# Patient Record
Sex: Male | Born: 1960
Health system: Southern US, Community
[De-identification: ages and names within clinical notes are randomized; demographics above are authoritative.]

## PROBLEM LIST (undated history)

## (undated) DIAGNOSIS — C61 Malignant neoplasm of prostate: Secondary | ICD-10-CM

## (undated) DIAGNOSIS — S069X9A Unspecified intracranial injury with loss of consciousness of unspecified duration, initial encounter: Secondary | ICD-10-CM

## (undated) DIAGNOSIS — N401 Enlarged prostate with lower urinary tract symptoms: Secondary | ICD-10-CM

## (undated) DIAGNOSIS — Z87442 Personal history of urinary calculi: Secondary | ICD-10-CM

## (undated) DIAGNOSIS — M1990 Unspecified osteoarthritis, unspecified site: Secondary | ICD-10-CM

## (undated) DIAGNOSIS — Z87828 Personal history of other (healed) physical injury and trauma: Secondary | ICD-10-CM

## (undated) DIAGNOSIS — G40909 Epilepsy, unspecified, not intractable, without status epilepticus: Secondary | ICD-10-CM

## (undated) DIAGNOSIS — Z8719 Personal history of other diseases of the digestive system: Secondary | ICD-10-CM

## (undated) DIAGNOSIS — R569 Unspecified convulsions: Secondary | ICD-10-CM

## (undated) DIAGNOSIS — Z87820 Personal history of traumatic brain injury: Secondary | ICD-10-CM

## (undated) DIAGNOSIS — G3184 Mild cognitive impairment, so stated: Secondary | ICD-10-CM

## (undated) DIAGNOSIS — Z973 Presence of spectacles and contact lenses: Secondary | ICD-10-CM

## (undated) DIAGNOSIS — G8114 Spastic hemiplegia affecting left nondominant side: Secondary | ICD-10-CM

## (undated) HISTORY — DX: Personal history of other diseases of the digestive system: Z87.19

## (undated) HISTORY — PX: HEMICOLECTOMY W/ OSTOMY: SHX1738

## (undated) HISTORY — PX: ABDOMINAL SURGERY: SHX537

## (undated) HISTORY — PX: COLOSTOMY TAKEDOWN: SHX5783

---

## 1996-12-22 DIAGNOSIS — Z87820 Personal history of traumatic brain injury: Secondary | ICD-10-CM

## 1996-12-22 HISTORY — DX: Personal history of traumatic brain injury: Z87.820

## 2011-11-20 ENCOUNTER — Emergency Department (HOSPITAL_COMMUNITY): Payer: Medicare PPO

## 2011-11-20 ENCOUNTER — Emergency Department (HOSPITAL_COMMUNITY)
Admission: EM | Admit: 2011-11-20 | Discharge: 2011-11-20 | Disposition: A | Discharge status: Home / Self Care | Payer: Medicare PPO | Attending: Emergency Medicine | Admitting: Emergency Medicine

## 2011-11-20 ENCOUNTER — Encounter: Payer: Self-pay | Admitting: *Deleted

## 2011-11-20 DIAGNOSIS — M545 Low back pain, unspecified: Secondary | ICD-10-CM | POA: Insufficient documentation

## 2011-11-20 DIAGNOSIS — M549 Dorsalgia, unspecified: Secondary | ICD-10-CM

## 2011-11-20 HISTORY — DX: Epilepsy, unspecified, not intractable, without status epilepticus: G40.909

## 2011-11-20 LAB — URINALYSIS, ROUTINE W REFLEX MICROSCOPIC
Glucose, UA: NEGATIVE mg/dL
Ketones, ur: NEGATIVE mg/dL
Leukocytes, UA: NEGATIVE
Nitrite: NEGATIVE
Specific Gravity, Urine: 1.034 — ABNORMAL HIGH (ref 1.005–1.030)
pH: 6.5 (ref 5.0–8.0)

## 2011-11-20 MED ORDER — HYDROCODONE-ACETAMINOPHEN 5-325 MG PO TABS: 1.0000 | ORAL_TABLET | ORAL | Status: AC | PRN

## 2011-11-20 NOTE — ED Notes (Signed)
Pt states "it started night before last, couldn't lay down, couldn't get comfortable"; pt indicates pain is in mid-back, denies dysuria.

## 2011-11-20 NOTE — ED Notes (Signed)
Patient transported to X-ray 

## 2011-11-20 NOTE — ED Notes (Signed)
MD at bedside. 
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
## 2012-03-29 ENCOUNTER — Ambulatory Visit (HOSPITAL_COMMUNITY)
Admission: RE | Admit: 2012-03-29 | Discharge: 2012-03-29 | Disposition: A | Discharge status: Home / Self Care | Payer: Medicare PPO | Source: Ambulatory Visit | Attending: Family Medicine | Admitting: Family Medicine

## 2012-03-29 ENCOUNTER — Other Ambulatory Visit: Payer: Self-pay

## 2012-03-29 DIAGNOSIS — I498 Other specified cardiac arrhythmias: Secondary | ICD-10-CM | POA: Insufficient documentation

## 2012-03-29 LAB — LIPID PANEL
Cholesterol: 189 mg/dL (ref 0–200)
Total CHOL/HDL Ratio: 2.8 RATIO

## 2012-03-29 LAB — HEMOGLOBIN A1C
Hgb A1c MFr Bld: 5.7 % — ABNORMAL HIGH (ref <5.7)
Mean Plasma Glucose: 117 mg/dL — ABNORMAL HIGH (ref <117)

## 2012-03-29 LAB — GLUCOSE, RANDOM: Glucose, Bld: 97 mg/dL (ref 70–99)

## 2012-06-04 ENCOUNTER — Encounter (HOSPITAL_COMMUNITY): Payer: Self-pay | Admitting: *Deleted

## 2012-06-04 ENCOUNTER — Inpatient Hospital Stay (HOSPITAL_COMMUNITY)
Admission: EM | Admit: 2012-06-04 | Discharge: 2012-06-06 | DRG: 101 | Disposition: A | Discharge status: Home / Self Care | Payer: Medicare HMO | Attending: Internal Medicine | Admitting: Internal Medicine

## 2012-06-04 DIAGNOSIS — Z9119 Patient's noncompliance with other medical treatment and regimen: Secondary | ICD-10-CM

## 2012-06-04 DIAGNOSIS — Z79899 Other long term (current) drug therapy: Secondary | ICD-10-CM

## 2012-06-04 DIAGNOSIS — G40909 Epilepsy, unspecified, not intractable, without status epilepticus: Principal | ICD-10-CM | POA: Diagnosis present

## 2012-06-04 DIAGNOSIS — Z91199 Patient's noncompliance with other medical treatment and regimen due to unspecified reason: Secondary | ICD-10-CM

## 2012-06-04 DIAGNOSIS — R270 Ataxia, unspecified: Secondary | ICD-10-CM

## 2012-06-04 DIAGNOSIS — R569 Unspecified convulsions: Secondary | ICD-10-CM

## 2012-06-04 LAB — COMPREHENSIVE METABOLIC PANEL
ALT: 14 U/L (ref 0–53)
AST: 20 U/L (ref 0–37)
CO2: 23 mEq/L (ref 19–32)
Chloride: 104 mEq/L (ref 96–112)
Creatinine, Ser: 0.9 mg/dL (ref 0.50–1.35)
GFR calc Af Amer: >90 mL/min (ref >90)
GFR calc non Af Amer: >90 mL/min (ref >90)
Glucose, Bld: 123 mg/dL — ABNORMAL HIGH (ref 70–99)
Total Bilirubin: 0.3 mg/dL (ref 0.3–1.2)

## 2012-06-04 LAB — CBC
Hemoglobin: 14.9 g/dL (ref 13.0–17.0)
MCV: 89.6 fL (ref 78.0–100.0)
Platelets: 160 10*3/uL (ref 150–400)
RBC: 4.9 MIL/uL (ref 4.22–5.81)
WBC: 4.3 10*3/uL (ref 4.0–10.5)

## 2012-06-04 MED ORDER — LORAZEPAM 2 MG/ML IJ SOLN
INTRAMUSCULAR | Status: AC
  Administered 2012-06-04: 1 mg
  Filled 2012-06-04: qty 1

## 2012-06-04 MED ORDER — SODIUM CHLORIDE 0.9 % IV SOLN
500.0000 mg | Freq: Two times a day (BID) | INTRAVENOUS | Status: DC
  Administered 2012-06-04: 500 mg via INTRAVENOUS
  Filled 2012-06-04 (×2): qty 5

## 2012-06-04 NOTE — ED Notes (Signed)
Pt stated that he could not urinate.    
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
## 2012-06-04 NOTE — ED Notes (Signed)
Patient having a seizure at this time. Seizure involves the patient's whole body with jerking and stiffness of extremities noted. Eyes are closed. Skin warm. Patient foaming at the mouth and biting tongue. No incontinence noted. Orson Slick, PA and Hyman Hopes, MD at bedside.

## 2012-06-04 NOTE — ED Notes (Signed)
Per EMS pt in from home watching movie and had witnessed seizure. Pt post-ictal on EMS arrival. Now A&Ox's4. Pt denies other complaints.

## 2012-06-04 NOTE — ED Notes (Signed)
Labs drawn by RN 

## 2012-06-05 DIAGNOSIS — G40309 Generalized idiopathic epilepsy and epileptic syndromes, not intractable, without status epilepticus: Secondary | ICD-10-CM

## 2012-06-05 LAB — CARDIAC PANEL(CRET KIN+CKTOT+MB+TROPI)
Relative Index: 0.9 (ref 0.0–2.5)
Total CK: 456 U/L — ABNORMAL HIGH (ref 7–232)
Troponin I: <0.3 ng/mL (ref <0.30)

## 2012-06-05 LAB — TSH: TSH: 0.975 u[IU]/mL (ref 0.350–4.500)

## 2012-06-05 LAB — GLUCOSE, CAPILLARY: Glucose-Capillary: 91 mg/dL (ref 70–99)

## 2012-06-05 MED ORDER — HYDROCODONE-ACETAMINOPHEN 5-325 MG PO TABS: 1.0000 | ORAL_TABLET | ORAL | Status: DC | PRN

## 2012-06-05 MED ORDER — SODIUM CHLORIDE 0.9 % IV SOLN: INTRAVENOUS | Status: AC

## 2012-06-05 MED ORDER — LEVETIRACETAM 500 MG PO TABS
500.0000 mg | ORAL_TABLET | Freq: Two times a day (BID) | ORAL | Status: DC
  Administered 2012-06-05 – 2012-06-06 (×3): 500 mg via ORAL
  Filled 2012-06-05 (×4): qty 1

## 2012-06-05 MED ORDER — LORAZEPAM 2 MG/ML IJ SOLN: 1.0000 mg | Freq: Four times a day (QID) | INTRAMUSCULAR | Status: DC | PRN

## 2012-06-05 MED ORDER — SODIUM CHLORIDE 0.9 % IJ SOLN
3.0000 mL | Freq: Two times a day (BID) | INTRAMUSCULAR | Status: DC
  Administered 2012-06-05 (×2): 3 mL via INTRAVENOUS

## 2012-06-05 MED ORDER — ENOXAPARIN SODIUM 40 MG/0.4ML ~~LOC~~ SOLN
40.0000 mg | SUBCUTANEOUS | Status: DC
  Administered 2012-06-05: 40 mg via SUBCUTANEOUS
  Filled 2012-06-05 (×2): qty 0.4

## 2012-06-05 MED ORDER — ONDANSETRON HCL 4 MG/2ML IJ SOLN: 4.0000 mg | Freq: Three times a day (TID) | INTRAMUSCULAR | Status: AC | PRN

## 2012-06-05 MED ORDER — CARBAMAZEPINE ER 400 MG PO TB12
400.0000 mg | ORAL_TABLET | Freq: Two times a day (BID) | ORAL | Status: DC
  Administered 2012-06-05 – 2012-06-06 (×3): 400 mg via ORAL
  Filled 2012-06-05 (×4): qty 1

## 2012-06-05 MED ORDER — MORPHINE SULFATE 2 MG/ML IJ SOLN
1.0000 mg | INTRAMUSCULAR | Status: DC | PRN
Start: 2012-06-05 — End: 2012-06-06

## 2012-06-05 NOTE — ED Provider Notes (Signed)
Medical screening examination/treatment/procedure(s) were conducted as a shared visit with non-physician practitioner(s) and myself.  I personally evaluated the patient during the encounter  H/o seizure d/o pw seizure pta. Witnessed gen tonic clonic seizure x 1 in ED. Ativan given, keppra. PERRL. Plans for monitoring and admission if not back to baseline.  Forbes Cellar, MD 06/05/12 [phone]

## 2012-06-05 NOTE — ED Notes (Addendum)
Pt ambulated for 45 ft.   Does not have as steady gait. Pt states "  I am numb on my left side."  When asked is it normal he replied" yes it is normal."

## 2012-06-05 NOTE — Progress Notes (Signed)
Paged MD at 1426 to make aware that the pt bradys down at times and that the lowest has been 47 and he sleeps through it. I wake the pt up and he says he feels fine. MD called back and we are continuing to monitor pt.

## 2012-06-05 NOTE — ED Notes (Signed)
Patient blood sugar was 91
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
## 2012-06-06 DIAGNOSIS — G40309 Generalized idiopathic epilepsy and epileptic syndromes, not intractable, without status epilepticus: Secondary | ICD-10-CM

## 2012-06-06 LAB — CARDIAC PANEL(CRET KIN+CKTOT+MB+TROPI)
Relative Index: 0.9 (ref 0.0–2.5)
Troponin I: <0.3 ng/mL (ref <0.30)

## 2012-06-06 LAB — CBC
Hemoglobin: 14.6 g/dL (ref 13.0–17.0)
MCHC: 34 g/dL (ref 30.0–36.0)
RBC: 4.77 MIL/uL (ref 4.22–5.81)
WBC: 5.6 10*3/uL (ref 4.0–10.5)

## 2012-06-06 LAB — BASIC METABOLIC PANEL
GFR calc Af Amer: >90 mL/min (ref >90)
GFR calc non Af Amer: >90 mL/min (ref >90)
Potassium: 3.6 mEq/L (ref 3.5–5.1)
Sodium: 140 mEq/L (ref 135–145)

## 2012-06-06 LAB — GLUCOSE, CAPILLARY: Glucose-Capillary: 95 mg/dL (ref 70–99)

## 2012-06-06 NOTE — Discharge Instructions (Signed)
Seizure, Adult A seizure is when the body shakes uncontrollably (convulsion). It can be a scary experience. A seizure is not a diagnosis. It is a sign that something else may be wrong with brain and/or spinal cord (central nervous system). In the Emergency Department, your condition is evaluated. The seizure is then treated. You will likely need follow-up with your caregiver. You will possibly need further testing and evaluation. Your caregiver or the specialist to whom you are referred will determine if further treatment is needed. After a seizure, you may be confused, dazed and drowsy. These problems (symptoms) often follow a seizure. Medication given to treat the seizure may also cause some of these changes. The time following a seizure is known as a refractory period. Hospital admission is seldom required unless there are other conditions present such as trauma or metabolic problems. Sometimes the seizure activity follows a fainting episode. This may have been caused by a brief drop in blood pressure. These fainting (syncopal) seizures are generally not a cause for concern.  HOME CARE INSTRUCTIONS   Follow up with your caregiver as suggested.   If any problems happen, get help right away.   Do not swim or drive until your caregiver says it is okay.  Document Released: 12/05/2000 Document Revised: 11/27/2011 Document Reviewed: 11/26/2011 [employer] Patient Information 2012 [employer]. 

## 2012-06-06 NOTE — Discharge Summary (Signed)
Physician Discharge Summary  Patrick Torres WUJ:[id number] DOB: 07/22/1961 DOA: 06/04/2012  PCP: Burtis Junes, MD  Admit date: 06/04/2012 Discharge date: 06/06/2012  Recommendations for Outpatient Follow-up:  1. Pt will need to follow up with primary neurologist Dr. Pearlean Brownie for re evaluation of antiepileptic regimen. Please note that this epileptic episode was contributed to likely medical non compliance and therefore compliance was emphasized rather than medication dose change.   Discharge Diagnoses:  Seizure, secondary to medical non compliance  Discharge Condition: Stable  Diet recommendation: None  History of present illness:  Patient is a 51 year old male with history of epilepsy who presents by EMS after a witnessed seizure at home. Patient states that he was sitting down and watching a movie when he had a brief seizure episode. EMS report the patient was postictal with confusion upon arrival. This gradually improved during transport to the emergency room. Patient denies any recollection of the event or episode. Currently patient has no complaints. Patient states that he is usually very regular with taking his seizure medications. Patient is on Keppra and Tegretol. He is followed by Dr. Pearlean Brownie. Pt denies any symptoms at this time, no chest pain, no shortness of breath, no abdominal or urinary concerns, no focal neurologic weakness.  Hospital Course:   Seizure - pt was admitted to telemetry floor for further evaluation and management - please note that detailed history revealed medical non compliance and this was emphasized during the hospital stay - we have continue home medication regimen and pt has not had any further seizures during the hospital stay - electrolyte panel and CB unremarkable - pt is currently at baseline health status and feels ready for discharge  Procedures:  None  Consultations:  None  Discharge Exam: Filed Vitals:   06/06/12 0655  BP: 132/67    Pulse: 75  Temp: 97.9 F (36.6 C)  Resp: 20   Filed Vitals:   06/05/12 0855 06/05/12 1529 06/05/12 2202 06/06/12 0655  BP: 137/81 111/75 143/87 132/67  Pulse: 57 55 50 75  Temp: 97.8 F (36.6 C) 98.2 F (36.8 C) 98.1 F (36.7 C) 97.9 F (36.6 C)  TempSrc: Oral Oral Oral Oral  Resp: 18 18 20 20   Height: 5\' 8"  (1.727 m)     Weight: 97.523 kg (215 lb)     SpO2: 96% 100% 100% 98%    General: Pt is alert, follows commands appropriately, not in acute distress Cardiovascular: Regular rate and rhythm, S1/S2 +, no murmurs, no rubs, no gallops Respiratory: Clear to auscultation bilaterally, no wheezing, no crackles, no rhonchi Abdominal: Soft, non tender, non distended, bowel sounds +, no guarding Extremities: no edema, no cyanosis, pulses palpable bilaterally DP and PT Neuro: Grossly nonfocal  Discharge Instructions  Discharge Orders    Future Orders Please Complete By Expires   Diet - low sodium heart healthy      Increase activity slowly        Medication List  As of 06/06/2012  8:36 AM   TAKE these medications         carbamazepine 400 MG 12 hr tablet   Commonly known as: TEGRETOL XR   Take 400 mg by mouth 2 (two) times daily.      levETIRAcetam 500 MG tablet   Commonly known as: KEPPRA   Take 500 mg by mouth 2 (two) times daily.           Follow-up Information    Follow up with Burtis Junes, MD. (As needed if symptoms worsen)  Contact information:   Individual [address] [facility] [address] [phone]       Follow up with Gates Rigg, MD in 4 weeks.   Contact information:   [address], [address] [facility] [place] [address] [phone]           The results of significant diagnostics from this hospitalization (including imaging, microbiology, ancillary and laboratory) are listed below for reference.    Significant Diagnostic Studies: No results  found.  Microbiology: No results found for this or any previous visit (from the past 240 hour(s)).   Labs: Basic Metabolic Panel:  Lab 06/06/12 6962 06/04/12 2039  NA 140 138  K 3.6 3.6  CL 107 104  CO2 25 23  GLUCOSE 94 123*  BUN 8 8  CREATININE 0.92 0.90  CALCIUM 8.9 9.3  MG 2.3 --  PHOS 2.7 --   Liver Function Tests: Lab 06/04/12 2039  AST 20  ALT 14  ALKPHOS 57  BILITOT 0.3  PROT 7.3  ALBUMIN 3.7   CBC: Lab 06/06/12 0535 06/04/12 2039  WBC 5.6 4.3  HGB 14.6 14.9  HCT 43.0 43.9  MCV 90.1 89.6  PLT 149 160   Cardiac Enzymes:  Lab 06/05/12 2339 06/05/12 1611 06/05/12 0855  CKTOTAL 479* 456* 456*  CKMB 4.4* 4.3* 4.3*  CKMBINDEX -- -- --  TROPONINI <0.30 <0.30 <0.30   CBG:  Lab 06/06/12 0744 06/05/12 0804 06/04/12 2103  GLUCAP 95 91 113*    Time coordinating discharge: Over 30 minutes  Signed:  Debbora Presto, MD  [facility] 06/06/2012, 8:36 AM Pager [phone]  If 7PM-7AM, please contact night-coverage [email or web address] Password [email or web address]

## 2012-09-24 ENCOUNTER — Encounter (HOSPITAL_COMMUNITY): Payer: Self-pay | Admitting: Emergency Medicine

## 2012-09-24 ENCOUNTER — Emergency Department (INDEPENDENT_AMBULATORY_CARE_PROVIDER_SITE_OTHER)
Admission: EM | Admit: 2012-09-24 | Discharge: 2012-09-24 | Disposition: A | Discharge status: Home / Self Care | Payer: Medicare HMO | Source: Home / Self Care | Attending: Family Medicine | Admitting: Family Medicine

## 2012-09-24 DIAGNOSIS — L0201 Cutaneous abscess of face: Secondary | ICD-10-CM

## 2012-09-24 DIAGNOSIS — L03211 Cellulitis of face: Secondary | ICD-10-CM

## 2012-09-24 MED ORDER — DOXYCYCLINE HYCLATE 100 MG PO CAPS: 100.0000 mg | ORAL_CAPSULE | Freq: Two times a day (BID) | ORAL | Status: DC

## 2012-09-24 NOTE — ED Notes (Signed)
Noticed Sunday or Monday as a bump, then had a friend squeeze this area, has intermittently drained since then.  Patient reports this will not go away, continues to fill with pus.
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
## 2012-09-27 LAB — CULTURE, ROUTINE-ABSCESS

## 2012-09-30 NOTE — ED Notes (Addendum)
Abscess culture forehead:  Abundant Staph. Aureus. Pt. adequately treated with Doxycycline. Patrick Torres 09/30/2012

## 2013-04-10 ENCOUNTER — Emergency Department (HOSPITAL_COMMUNITY)
Admission: EM | Admit: 2013-04-10 | Discharge: 2013-04-10 | Disposition: A | Discharge status: Home / Self Care | Payer: Medicare HMO | Attending: Emergency Medicine | Admitting: Emergency Medicine

## 2013-04-10 ENCOUNTER — Emergency Department (HOSPITAL_COMMUNITY): Payer: Medicare HMO

## 2013-04-10 ENCOUNTER — Encounter (HOSPITAL_COMMUNITY): Payer: Self-pay | Admitting: Emergency Medicine

## 2013-04-10 DIAGNOSIS — Z8719 Personal history of other diseases of the digestive system: Secondary | ICD-10-CM | POA: Insufficient documentation

## 2013-04-10 DIAGNOSIS — Z933 Colostomy status: Secondary | ICD-10-CM | POA: Insufficient documentation

## 2013-04-10 DIAGNOSIS — Z79899 Other long term (current) drug therapy: Secondary | ICD-10-CM | POA: Insufficient documentation

## 2013-04-10 DIAGNOSIS — W108XXA Fall (on) (from) other stairs and steps, initial encounter: Secondary | ICD-10-CM | POA: Insufficient documentation

## 2013-04-10 DIAGNOSIS — IMO0002 Reserved for concepts with insufficient information to code with codable children: Secondary | ICD-10-CM | POA: Insufficient documentation

## 2013-04-10 DIAGNOSIS — Y939 Activity, unspecified: Secondary | ICD-10-CM | POA: Insufficient documentation

## 2013-04-10 DIAGNOSIS — G40909 Epilepsy, unspecified, not intractable, without status epilepticus: Secondary | ICD-10-CM | POA: Insufficient documentation

## 2013-04-10 DIAGNOSIS — M545 Low back pain, unspecified: Secondary | ICD-10-CM

## 2013-04-10 DIAGNOSIS — Y929 Unspecified place or not applicable: Secondary | ICD-10-CM | POA: Insufficient documentation

## 2013-04-10 MED ORDER — CYCLOBENZAPRINE HCL 10 MG PO TABS: 10.0000 mg | ORAL_TABLET | Freq: Two times a day (BID) | ORAL | Status: DC | PRN

## 2013-04-10 MED ORDER — IBUPROFEN 800 MG PO TABS: 800.0000 mg | ORAL_TABLET | Freq: Three times a day (TID) | ORAL | Status: DC

## 2013-04-10 MED ORDER — IBUPROFEN 400 MG PO TABS
800.0000 mg | ORAL_TABLET | Freq: Once | ORAL | Status: AC
  Administered 2013-04-10: 800 mg via ORAL
  Filled 2013-04-10: qty 2

## 2013-04-10 MED ORDER — HYDROCODONE-ACETAMINOPHEN 5-325 MG PO TABS: 2.0000 | ORAL_TABLET | ORAL | Status: DC | PRN
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
## 2013-04-10 NOTE — ED Notes (Signed)
Pt sts fall on Friday with tailbone pain; pt sts painful to move or sit

## 2013-04-11 NOTE — ED Provider Notes (Signed)
Medical screening examination/treatment/procedure(s) were performed by non-physician practitioner and as supervising physician I was immediately available for consultation/collaboration.  Martha K Linker, MD 04/11/13 0914 

## 2013-06-27 ENCOUNTER — Telehealth: Payer: Self-pay | Admitting: Nurse Practitioner

## 2013-09-08 ENCOUNTER — Encounter: Payer: Self-pay | Admitting: Neurology

## 2013-09-12 ENCOUNTER — Encounter: Payer: Self-pay | Admitting: Neurology

## 2013-09-27 ENCOUNTER — Ambulatory Visit (INDEPENDENT_AMBULATORY_CARE_PROVIDER_SITE_OTHER): Payer: 59 | Admitting: Neurology

## 2013-09-27 ENCOUNTER — Encounter: Payer: Self-pay | Admitting: Neurology

## 2013-09-27 VITALS — BP 120/74 | HR 50 | Ht 69.0 in | Wt 214.0 lb

## 2013-09-27 DIAGNOSIS — G40309 Generalized idiopathic epilepsy and epileptic syndromes, not intractable, without status epilepticus: Secondary | ICD-10-CM | POA: Insufficient documentation

## 2013-09-27 MED ORDER — LEVETIRACETAM 500 MG PO TABS: 500.0000 mg | ORAL_TABLET | Freq: Two times a day (BID) | ORAL | Status: DC

## 2013-09-27 MED ORDER — CARBAMAZEPINE ER 400 MG PO TB12: 400.0000 mg | ORAL_TABLET | Freq: Two times a day (BID) | ORAL | Status: DC
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
## 2013-09-27 NOTE — Patient Instructions (Addendum)
Continue Tegretol-XR 400 mg twice daily and Keppra 500 mg twice daily.I counselled the patient to be compliant with his medications. Check carbamazepine level, CMP and CBC. Return for followup in one year with Darrol Angel, NP or call earlier if necessary

## 2013-09-28 LAB — COMPREHENSIVE METABOLIC PANEL
AST: 15 IU/L (ref 0–40)
Albumin/Globulin Ratio: 1.4 (ref 1.1–2.5)
Albumin: 4.2 g/dL (ref 3.5–5.5)
BUN/Creatinine Ratio: 8 — ABNORMAL LOW (ref 9–20)
BUN: 7 mg/dL (ref 6–24)
CO2: 25 mmol/L (ref 18–29)
Calcium: 9.1 mg/dL (ref 8.7–10.2)
Creatinine, Ser: 0.92 mg/dL (ref 0.76–1.27)
GFR calc non Af Amer: 94 mL/min/{1.73_m2} (ref >59)
Globulin, Total: 2.9 g/dL (ref 1.5–4.5)
Sodium: 144 mmol/L (ref 134–144)

## 2013-09-28 LAB — CBC
HCT: 43.3 % (ref 37.5–51.0)
MCV: 88 fL (ref 79–97)
RBC: 4.9 x10E6/uL (ref 4.14–5.80)
RDW: 13.7 % (ref 12.3–15.4)
WBC: 3.9 10*3/uL (ref 3.4–10.8)

## 2013-10-03 NOTE — Progress Notes (Signed)
Quick Note:  I called and gave the results of labs (normal studies) to pt. He verbalized understanding. ______

## 2014-01-01 ENCOUNTER — Other Ambulatory Visit: Payer: Self-pay

## 2014-01-01 MED ORDER — CARBAMAZEPINE ER 400 MG PO TB12: 400.0000 mg | ORAL_TABLET | Freq: Two times a day (BID) | ORAL | Status: DC

## 2014-01-01 MED ORDER — LEVETIRACETAM 500 MG PO TABS: 500.0000 mg | ORAL_TABLET | Freq: Two times a day (BID) | ORAL | Status: DC

## 2014-01-16 ENCOUNTER — Telehealth: Payer: Self-pay | Admitting: Neurology

## 2014-01-16 NOTE — Telephone Encounter (Signed)
Patient called and states he received a call from his pharmacy about antibiotics, and patient would like a call explaining why he needs them. Please call the patient back and advise.

## 2014-01-16 NOTE — Telephone Encounter (Signed)
I called pt and told him that from our records I do not see anything about medication (antibiotics) being called in by Dr. Leonie Man.  He received call from [facility].   Pt to call [facility] back and get more information.  Pt verbalized understanding.

## 2014-01-17 ENCOUNTER — Encounter (HOSPITAL_COMMUNITY): Payer: Self-pay | Admitting: Emergency Medicine

## 2014-01-17 ENCOUNTER — Emergency Department (INDEPENDENT_AMBULATORY_CARE_PROVIDER_SITE_OTHER)
Admission: EM | Admit: 2014-01-17 | Discharge: 2014-01-17 | Disposition: A | Discharge status: Home / Self Care | Payer: Medicare HMO | Source: Home / Self Care

## 2014-01-17 DIAGNOSIS — L723 Sebaceous cyst: Secondary | ICD-10-CM
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
## 2014-01-17 NOTE — Discharge Instructions (Signed)
Wash and ointment bandage as needed.

## 2014-01-17 NOTE — ED Notes (Signed)
Applied 4x4 gauze and secured w/medipore.

## 2014-01-17 NOTE — ED Notes (Signed)
See physician note Pt c/o abscess on back onset 2 days Alert w/no signs of acute distress.

## 2014-01-19 LAB — CULTURE, ROUTINE-ABSCESS: Gram Stain: NONE SEEN

## 2014-01-19 NOTE — ED Notes (Addendum)
Abscess culture: Few Peptostreptococcus. No sensitivity done.  Pt. had I and D.  Message sent to Dr. Juventino Slovak. Patrick Torres 01/19/2014 Discussed with Dr. Juventino Slovak. He said it was a sebaceous cyst, not infected and this is normal flora.  No further action needed. 01/22/2014

## 2014-03-01 NOTE — Telephone Encounter (Signed)
Pt came in for his visit closing encounter °

## 2014-07-26 ENCOUNTER — Other Ambulatory Visit: Payer: Self-pay

## 2014-07-26 MED ORDER — LEVETIRACETAM 500 MG PO TABS: 500.0000 mg | ORAL_TABLET | Freq: Two times a day (BID) | ORAL | Status: DC

## 2014-07-26 MED ORDER — CARBAMAZEPINE ER 400 MG PO TB12: 400.0000 mg | ORAL_TABLET | Freq: Two times a day (BID) | ORAL | Status: DC

## 2014-09-26 ENCOUNTER — Ambulatory Visit (INDEPENDENT_AMBULATORY_CARE_PROVIDER_SITE_OTHER): Payer: Medicare HMO | Admitting: Nurse Practitioner

## 2014-09-26 ENCOUNTER — Encounter: Payer: Self-pay | Admitting: Nurse Practitioner

## 2014-09-26 VITALS — BP 112/70 | HR 60 | Ht 69.0 in | Wt 211.0 lb

## 2014-09-26 DIAGNOSIS — G40309 Generalized idiopathic epilepsy and epileptic syndromes, not intractable, without status epilepticus: Secondary | ICD-10-CM

## 2014-09-26 DIAGNOSIS — Z5181 Encounter for therapeutic drug level monitoring: Secondary | ICD-10-CM

## 2014-09-26 MED ORDER — LEVETIRACETAM 500 MG PO TABS: 500.0000 mg | ORAL_TABLET | Freq: Two times a day (BID) | ORAL | Status: DC

## 2014-09-26 MED ORDER — CARBAMAZEPINE ER 400 MG PO TB12: 400.0000 mg | ORAL_TABLET | Freq: Two times a day (BID) | ORAL | Status: DC

## 2014-09-26 NOTE — Patient Instructions (Signed)
Continue Tegretol at current dose will refill Continue Keppra at current dose will refill Will check labs today Followup yearly and when necessary
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
## 2014-09-27 LAB — CBC WITH DIFFERENTIAL/PLATELET
BASOS: 0 %
Basophils Absolute: 0 10*3/uL (ref 0.0–0.2)
EOS ABS: 0.2 10*3/uL (ref 0.0–0.4)
Eos: 3 %
HEMATOCRIT: 43.3 % (ref 37.5–51.0)
HEMOGLOBIN: 15.1 g/dL (ref 12.6–17.7)
Immature Grans (Abs): 0 10*3/uL (ref 0.0–0.1)
Immature Granulocytes: 0 %
LYMPHS ABS: 1.9 10*3/uL (ref 0.7–3.1)
Lymphs: 41 %
MCH: 31.3 pg (ref 26.6–33.0)
MCHC: 34.9 g/dL (ref 31.5–35.7)
MCV: 90 fL (ref 79–97)
MONOS ABS: 0.4 10*3/uL (ref 0.1–0.9)
Monocytes: 8 %
NEUTROS ABS: 2.2 10*3/uL (ref 1.4–7.0)
Neutrophils Relative %: 48 %
RBC: 4.82 x10E6/uL (ref 4.14–5.80)
RDW: 14.4 % (ref 12.3–15.4)
WBC: 4.7 10*3/uL (ref 3.4–10.8)

## 2014-09-27 LAB — COMPREHENSIVE METABOLIC PANEL
A/G RATIO: 1.4 (ref 1.1–2.5)
ALT: 16 IU/L (ref 0–44)
AST: 22 IU/L (ref 0–40)
Albumin: 4.5 g/dL (ref 3.5–5.5)
Alkaline Phosphatase: 64 IU/L (ref 39–117)
BILIRUBIN TOTAL: 0.4 mg/dL (ref 0.0–1.2)
BUN/Creatinine Ratio: 5 — ABNORMAL LOW (ref 9–20)
BUN: 6 mg/dL (ref 6–24)
CO2: 16 mmol/L — ABNORMAL LOW (ref 18–29)
Calcium: 9.7 mg/dL (ref 8.7–10.2)
Chloride: 109 mmol/L — ABNORMAL HIGH (ref 97–108)
Creatinine, Ser: 1.12 mg/dL (ref 0.76–1.27)
GFR, EST AFRICAN AMERICAN: 85 mL/min/{1.73_m2} (ref >59)
GFR, EST NON AFRICAN AMERICAN: 74 mL/min/{1.73_m2} (ref >59)
Globulin, Total: 3.2 g/dL (ref 1.5–4.5)
Glucose: 99 mg/dL (ref 65–99)
POTASSIUM: 4.7 mmol/L (ref 3.5–5.2)
SODIUM: 150 mmol/L — AB (ref 134–144)
Total Protein: 7.7 g/dL (ref 6.0–8.5)

## 2014-09-27 LAB — CARBAMAZEPINE LEVEL, TOTAL: Carbamazepine Lvl: 9.3 ug/mL (ref 4.0–12.0)

## 2014-09-28 ENCOUNTER — Telehealth: Payer: Self-pay | Admitting: Nurse Practitioner

## 2014-09-28 ENCOUNTER — Encounter: Payer: Self-pay | Admitting: Nurse Practitioner

## 2014-09-28 DIAGNOSIS — Z5181 Encounter for therapeutic drug level monitoring: Secondary | ICD-10-CM

## 2014-09-28 NOTE — Telephone Encounter (Signed)
Unable to reach patient by phone times 2 to report abnormal Na level. Will send letter Needs repeat in 1 month. Orders entered.

## 2014-10-02 NOTE — Progress Notes (Signed)
I reviewed note and agree with plan.   Leotha Westermeyer R. Vanshika Jastrzebski, MD  Certified in Neurology, Neurophysiology and Neuroimaging  [facility] [address], [address] [place], [place] [address] [phone]   

## 2014-11-17 ENCOUNTER — Encounter (HOSPITAL_COMMUNITY): Payer: Self-pay | Admitting: Emergency Medicine

## 2014-11-17 ENCOUNTER — Emergency Department (HOSPITAL_COMMUNITY)
Admission: EM | Admit: 2014-11-17 | Discharge: 2014-11-17 | Disposition: A | Discharge status: Home / Self Care | Payer: Medicare HMO | Attending: Emergency Medicine | Admitting: Emergency Medicine

## 2014-11-17 DIAGNOSIS — G40909 Epilepsy, unspecified, not intractable, without status epilepticus: Secondary | ICD-10-CM | POA: Diagnosis not present

## 2014-11-17 DIAGNOSIS — Z791 Long term (current) use of non-steroidal anti-inflammatories (NSAID): Secondary | ICD-10-CM | POA: Diagnosis not present

## 2014-11-17 DIAGNOSIS — M545 Low back pain, unspecified: Secondary | ICD-10-CM

## 2014-11-17 DIAGNOSIS — Z933 Colostomy status: Secondary | ICD-10-CM | POA: Insufficient documentation

## 2014-11-17 DIAGNOSIS — Z8719 Personal history of other diseases of the digestive system: Secondary | ICD-10-CM | POA: Diagnosis not present

## 2014-11-17 DIAGNOSIS — Z79899 Other long term (current) drug therapy: Secondary | ICD-10-CM | POA: Insufficient documentation

## 2014-11-17 LAB — CBC WITH DIFFERENTIAL/PLATELET
Basophils Absolute: 0.1 10*3/uL (ref 0.0–0.1)
Basophils Relative: 2 % — ABNORMAL HIGH (ref 0–1)
EOS ABS: 0.4 10*3/uL (ref 0.0–0.7)
EOS PCT: 11 % — AB (ref 0–5)
HEMATOCRIT: 44.8 % (ref 39.0–52.0)
Hemoglobin: 15.5 g/dL (ref 13.0–17.0)
LYMPHS ABS: 1.7 10*3/uL (ref 0.7–4.0)
LYMPHS PCT: 44 % (ref 12–46)
MCH: 31.1 pg (ref 26.0–34.0)
MCHC: 34.6 g/dL (ref 30.0–36.0)
MCV: 90 fL (ref 78.0–100.0)
MONO ABS: 0.5 10*3/uL (ref 0.1–1.0)
MONOS PCT: 13 % — AB (ref 3–12)
Neutro Abs: 1.1 10*3/uL — ABNORMAL LOW (ref 1.7–7.7)
Neutrophils Relative %: 30 % — ABNORMAL LOW (ref 43–77)
PLATELETS: 141 10*3/uL — AB (ref 150–400)
RBC: 4.98 MIL/uL (ref 4.22–5.81)
RDW: 12.8 % (ref 11.5–15.5)
WBC: 3.7 10*3/uL — ABNORMAL LOW (ref 4.0–10.5)

## 2014-11-17 LAB — URINALYSIS, ROUTINE W REFLEX MICROSCOPIC
GLUCOSE, UA: NEGATIVE mg/dL
HGB URINE DIPSTICK: NEGATIVE
Ketones, ur: NEGATIVE mg/dL
Leukocytes, UA: NEGATIVE
Nitrite: NEGATIVE
PROTEIN: 30 mg/dL — AB
SPECIFIC GRAVITY, URINE: 1.034 — AB (ref 1.005–1.030)
UROBILINOGEN UA: 1 mg/dL (ref 0.0–1.0)
pH: 7 (ref 5.0–8.0)

## 2014-11-17 LAB — BASIC METABOLIC PANEL
Anion gap: 12 (ref 5–15)
BUN: 11 mg/dL (ref 6–23)
CO2: 21 meq/L (ref 19–32)
Calcium: 9.1 mg/dL (ref 8.4–10.5)
Chloride: 104 mEq/L (ref 96–112)
Creatinine, Ser: 0.9 mg/dL (ref 0.50–1.35)
GFR calc Af Amer: >90 mL/min (ref >90)
GLUCOSE: 102 mg/dL — AB (ref 70–99)
POTASSIUM: 4.2 meq/L (ref 3.7–5.3)
Sodium: 137 mEq/L (ref 137–147)

## 2014-11-17 LAB — URINE MICROSCOPIC-ADD ON

## 2014-11-17 MED ORDER — NAPROXEN 500 MG PO TABS: 500.0000 mg | ORAL_TABLET | Freq: Two times a day (BID) | ORAL | Status: DC

## 2014-11-17 NOTE — ED Notes (Addendum)
Pt with Hx of TBI c/o sudden onset low back pain onset yesterday, denies loss of control of bowel and bladder. Pt ambulatory, denies Hx of same pain, denies trauma.

## 2014-11-17 NOTE — Discharge Instructions (Signed)
Were evaluated in the ED today for your low back pain. There does not appear to be an emergent cause for your symptoms at this time. Please schedule a follow-up appointment within 3-5 days with your primary care for further evaluation and management. If your symptoms worsen, you experience numbness or weakness, chest pain, shortness of breath or abdominal pain please return to ED for further evaluation   Emergency Department Resource Guide 1) Find a Doctor and Pay Out of Pocket Although you won't have to find out who is covered by your insurance plan, it is a good idea to ask around and get recommendations. You will then need to call the office and see if the doctor you have chosen will accept you as a new patient and what types of options they offer for patients who are self-pay. Some doctors offer discounts or will set up payment plans for their patients who do not have insurance, but you will need to ask so you aren't surprised when you get to your appointment.  2) Contact Your Local Health Department Not all health departments have doctors that can see patients for sick visits, but many do, so it is worth a call to see if yours does. If you don't know where your local health department is, you can check in your phone book. The CDC also has a tool to help you locate your state's health department, and many state websites also have listings of all of their local health departments.  3) Find a [facility] If your illness is not likely to be very severe or complicated, you may want to try a walk in clinic. These are popping up all over the country in pharmacies, drugstores, and shopping centers. They're usually staffed by nurse practitioners or physician assistants that have been trained to treat common illnesses and complaints. They're usually fairly quick and inexpensive. However, if you have serious medical issues or chronic medical problems, these are probably not your best option.  No Primary  Care Doctor: - Call Health Connect at  [phone] - they can help you locate a primary care doctor that  accepts your insurance, provides certain services, etc. - Physician Referral Service- [phone]  Chronic Pain Problems: Organization         Address  Phone   Notes  [facility]  [phone] Patients need to be referred by their primary care doctor.   Medication Assistance: Organization         Address  Phone   Notes  [facility] Medication [facility] [address] [place], [place] [address] [phone] --Must be a resident of [facility] -- Must have NO insurance coverage whatsoever (no Medicaid/ Medicare, etc.) -- The pt. MUST have a primary care doctor that directs their care regularly and follows them in the community   MedAssist  [phone]   [employer]  [phone]    Agencies that provide inexpensive medical care: Organization         Address  Phone   Notes  [place]  [phone]   [facility]    [phone]   [facility] [place], [place] [address] [phone]   [place] [address] [place] [phone]   Planned Parenthood    [phone]   [facility]    [phone]   Community Health and [place]  [phone]  [address], [place] Phone:  [phone], Fax:  [phone] Hours of Operation:  9 am - 6 pm, M-F.  Also accepts Medicaid/Medicare and self-pay.  [facility] for [place] [address], [address], [place] Phone: [phone], Fax: [phone]. Hours of Operation:  8:30 am - 5:30 pm, M-F.  Also accepts Medicaid and self-pay.  [facility] [place] [address], [place] Phone: [phone]   [place], [place], [place] [phone], Ext. [phone] Mondays & Thursdays: 7-9 AM.  First 15 patients are seen on a first  come, first serve basis.    [place] Providers:  Organization         Address  Phone   Notes  [facility] [address], [place] [phone] Also accepts self-pay patients.  [facility] [address], [place]  [phone]   [place], [address], [place] [phone]   [facility] [address], [place] [phone]   Lucianne Lei [address], [place]   [phone] Only accepts [place] Access [place] patients after they have their name applied to their card.   Self-Pay (no insurance) in [employer]:  Organization         Address  Phone   Notes  Sickle Cell Patients, [facility] Internal Medicine [place] [phone]   [facility] [place] [phone]   [facility] [place]  [place], [place], [place] [phone]   [facility]/Dr. Osei-Bonsu  [address] [place] or [address], [place] [phone] Phone number for both [place] and [place] locations is the same.  Urgent Medical and [facility] [address] [place] [phone]   [facility] [address] [place] or [address] [phone]   [facility] [address] [place] [phone], phone; [phone], fax Sees patients 1st and 3rd Saturday of every month.  Must not qualify for public or private insurance (i.e. Medicaid, Medicare, [place] Health Choice, Veterans' Benefits)  Household income should be no more than 200% of the poverty level The clinic cannot treat you if you are pregnant or think you are pregnant  Sexually transmitted diseases are not treated at the clinic.    Dental Care: Organization          Address  Phone  Notes  [facility] Department of [facility] [place] [phone] Accepts children up to age 31 who are enrolled in [place] or [place]; pregnant women with a Medicaid card; and children who have applied for Medicaid or [place] Health Choice, but were declined, whose parents can pay a reduced fee at time of service.  [facility] Department of [facility]  [address], [place] [phone] Accepts children up to age 70 who are enrolled in [place] or [place]; pregnant women with a Medicaid card; and children who have applied for Medicaid or [place] Health Choice, but were declined, whose parents can pay a reduced fee at time of service.  [facility] Adult Dental Access PROGRAM  [place], [place] [phone] Patients  are seen by appointment only. Walk-ins are not accepted. [place] will see patients 52 years of age and older. Monday - Tuesday (8am-5pm) Most Wednesdays (8:30-5pm) $30 per visit, cash only  [facility] Adult Dental Access PROGRAM  [address], [facility] [phone] Patients are seen by appointment only. Walk-ins are not accepted. [place] will see patients 31 years of age and older. One Wednesday Evening (Monthly: Volunteer Based).  $30 per visit, cash only  [place]  [phone] for adults; Children under age 35, call Graduate Pediatric Dentistry at [phone]. Children aged 54-14, please call [phone] to request a pediatric application.  Dental services are provided in all areas of dental care including fillings, crowns and bridges, complete and partial dentures, implants, gum treatment, root canals, and extractions. Preventive care is also provided. Treatment is provided to both adults and children. Patients are selected via a lottery and there is often a waiting list.   [facility] [address], [place]  [phone] [email or web address]   [facility] [address] [place], [place] [phone], Ext. [phone] Second and Fourth Thursday of each month, opens at 6:30 AM; Clinic ends at 9 AM.  Patients are seen on a first-come first-served basis, and a limited number are seen during each clinic.   [facility]  [address] Hillard Danker [place], [place] [phone]   Eligibility Requirements You must have lived in [place], [place], or [place] counties for at least the last three months.   You cannot be eligible for state or federal sponsored [employer], including [employer], [place], or [employer].   You generally cannot be eligible for healthcare insurance through your employer.    How to apply: Eligibility screenings are held every Tuesday and Wednesday afternoon from 1:00 pm until 4:00 pm. You do not need an appointment for the interview!  [facility] [address], [place], [place]   [place]  [place] Department  [place]  [phone]    Behavioral Health Resources in the Community: Intensive Outpatient Programs Organization         Address  Phone  Notes  Esmeralda Morehouse. [address], [place], [place] [phone]   [facility] Outpatient [address] [place], [place]   ADS: Alcohol & Drug Svcs [address] [place], [place]   [place] [address],  [place], [place] or [phone]   Substance Abuse Resources Organization         Address  Phone  Notes  Alcohol and Drug Services  [phone]   [place]  [phone]   [facility]   Chinita Pester  [phone]   Residential & Outpatient Substance Abuse Program  [phone]   Psychological  Services Organization         Address  Phone  Notes  [facility] [place]  [place]  [phone]   [place] [address] [place] or [phone]    Mobile Crisis Teams Organization         Address  Phone  Notes  [facility], Mobile Crisis Care Unit  [phone]   [facility]  [address] [place], [place]   [place] [address], [place] 18  [place] [phone]    Self-Help/Support Groups Organization         Address  Phone             Notes  [facility] [place] - variety of support groups  [place] Call for more information  Narcotics Anonymous (NA), [facility] [address], [employer] [place]  2 meetings at this location   Special educational needs teacher         Address  Phone  Notes  [facility]idential [facility] [place],    [place]  [phone]   [facility]  [address], [place] [phone], [place], [place]   [place], [place] [phone] Admissions: 8am-3pm M-F  Incentives Substance [place] [address],    [place], [place] [phone]   [facility] [address] [place], [place], [place]   [facility] [address],  [place], [place]   [facility]ight Programs - Intensive Outpatient [address] Kristeen [address], [place], [place]   [facility] ([place] [place].,  [place], [place] [phone] or [phone]   [facility] Services (RTS) [address] [place], [place] Accepts Medicaid  [facility] [place] [address],  [place] [phone] Substance Abuse/Addiction Treatment   [facility] Organization         Address  Phone  Notes  [facility]  [phone]   Domenic Schwab, PhD [address] Arlis Porta [place], [place]   [phone] or [phone]   [place], [place] [phone]   [facility] [address], [place], [place] [phone] Insurance/Medicaid/sponsorship through [facility] and Families [address] [place]                                    [place], [place] [phone] [place] [address], [place] [phone]    Dr. Adele Schilder  [phone]   [facility] Dept. 1) [address], [place] 2) [place] 3)  [place] [address], [place] [phone]  [phone]   [place] [phone] or [phone] (After Hours)
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
## 2014-11-17 NOTE — ED Notes (Signed)
Bed: WA03 Expected date:  Expected time:  Means of arrival:  Comments: EMS back pain 

## 2014-11-26 ENCOUNTER — Encounter (HOSPITAL_COMMUNITY): Payer: Self-pay | Admitting: *Deleted

## 2014-11-26 ENCOUNTER — Emergency Department (HOSPITAL_COMMUNITY): Payer: Medicare HMO

## 2014-11-26 ENCOUNTER — Inpatient Hospital Stay (HOSPITAL_COMMUNITY): Payer: Medicare HMO

## 2014-11-26 ENCOUNTER — Inpatient Hospital Stay (HOSPITAL_COMMUNITY)
Admission: EM | Admit: 2014-11-26 | Discharge: 2014-12-01 | DRG: 100 | Disposition: A | Discharge status: Skilled Nursing Facility | Payer: Medicare HMO | Attending: Internal Medicine | Admitting: Internal Medicine

## 2014-11-26 DIAGNOSIS — R569 Unspecified convulsions: Secondary | ICD-10-CM

## 2014-11-26 DIAGNOSIS — G40909 Epilepsy, unspecified, not intractable, without status epilepticus: Secondary | ICD-10-CM | POA: Diagnosis not present

## 2014-11-26 DIAGNOSIS — G3184 Mild cognitive impairment, so stated: Secondary | ICD-10-CM | POA: Diagnosis present

## 2014-11-26 DIAGNOSIS — G9340 Encephalopathy, unspecified: Secondary | ICD-10-CM | POA: Diagnosis present

## 2014-11-26 DIAGNOSIS — R4182 Altered mental status, unspecified: Secondary | ICD-10-CM | POA: Diagnosis present

## 2014-11-26 DIAGNOSIS — Z87820 Personal history of traumatic brain injury: Secondary | ICD-10-CM | POA: Diagnosis not present

## 2014-11-26 DIAGNOSIS — S069XAA Unspecified intracranial injury with loss of consciousness status unknown, initial encounter: Secondary | ICD-10-CM | POA: Diagnosis present

## 2014-11-26 DIAGNOSIS — R1310 Dysphagia, unspecified: Secondary | ICD-10-CM | POA: Diagnosis present

## 2014-11-26 DIAGNOSIS — Z933 Colostomy status: Secondary | ICD-10-CM | POA: Diagnosis not present

## 2014-11-26 DIAGNOSIS — R32 Unspecified urinary incontinence: Secondary | ICD-10-CM | POA: Diagnosis present

## 2014-11-26 DIAGNOSIS — G40409 Other generalized epilepsy and epileptic syndromes, not intractable, without status epilepticus: Secondary | ICD-10-CM | POA: Diagnosis not present

## 2014-11-26 DIAGNOSIS — S069X9A Unspecified intracranial injury with loss of consciousness of unspecified duration, initial encounter: Secondary | ICD-10-CM | POA: Diagnosis present

## 2014-11-26 DIAGNOSIS — E876 Hypokalemia: Secondary | ICD-10-CM | POA: Diagnosis present

## 2014-11-26 DIAGNOSIS — S069X0S Unspecified intracranial injury without loss of consciousness, sequela: Secondary | ICD-10-CM | POA: Diagnosis not present

## 2014-11-26 DIAGNOSIS — R410 Disorientation, unspecified: Secondary | ICD-10-CM | POA: Diagnosis not present

## 2014-11-26 HISTORY — DX: Unspecified intracranial injury with loss of consciousness of unspecified duration, initial encounter: S06.9X9A

## 2014-11-26 LAB — CARBAMAZEPINE LEVEL, TOTAL: Carbamazepine Lvl: 5.1 ug/mL (ref 4.0–12.0)

## 2014-11-26 LAB — URINE MICROSCOPIC-ADD ON

## 2014-11-26 LAB — CBC WITH DIFFERENTIAL/PLATELET
Basophils Absolute: 0 10*3/uL (ref 0.0–0.1)
Basophils Relative: 0 % (ref 0–1)
EOS ABS: 0 10*3/uL (ref 0.0–0.7)
EOS PCT: 0 % (ref 0–5)
HEMATOCRIT: 44.5 % (ref 39.0–52.0)
Hemoglobin: 15.4 g/dL (ref 13.0–17.0)
LYMPHS ABS: 0.9 10*3/uL (ref 0.7–4.0)
Lymphocytes Relative: 9 % — ABNORMAL LOW (ref 12–46)
MCH: 31.4 pg (ref 26.0–34.0)
MCHC: 34.6 g/dL (ref 30.0–36.0)
MCV: 90.6 fL (ref 78.0–100.0)
Monocytes Absolute: 1.1 10*3/uL — ABNORMAL HIGH (ref 0.1–1.0)
Monocytes Relative: 11 % (ref 3–12)
Neutro Abs: 7.8 10*3/uL — ABNORMAL HIGH (ref 1.7–7.7)
Neutrophils Relative %: 80 % — ABNORMAL HIGH (ref 43–77)
PLATELETS: 184 10*3/uL (ref 150–400)
RBC: 4.91 MIL/uL (ref 4.22–5.81)
RDW: 13.3 % (ref 11.5–15.5)
WBC: 9.8 10*3/uL (ref 4.0–10.5)

## 2014-11-26 LAB — I-STAT CHEM 8, ED
BUN: 16 mg/dL (ref 6–23)
Calcium, Ion: 1.24 mmol/L — ABNORMAL HIGH (ref 1.12–1.23)
Chloride: 109 mEq/L (ref 96–112)
Creatinine, Ser: 0.9 mg/dL (ref 0.50–1.35)
GLUCOSE: 101 mg/dL — AB (ref 70–99)
HCT: 47 % (ref 39.0–52.0)
HEMOGLOBIN: 16 g/dL (ref 13.0–17.0)
Potassium: 3.9 mEq/L (ref 3.7–5.3)
Sodium: 146 mEq/L (ref 137–147)
TCO2: 22 mmol/L (ref 0–100)

## 2014-11-26 LAB — RAPID URINE DRUG SCREEN, HOSP PERFORMED
AMPHETAMINES: NOT DETECTED
BARBITURATES: NOT DETECTED
Benzodiazepines: NOT DETECTED
Cocaine: NOT DETECTED
OPIATES: NOT DETECTED
TETRAHYDROCANNABINOL: NOT DETECTED

## 2014-11-26 LAB — URINALYSIS, ROUTINE W REFLEX MICROSCOPIC
Bilirubin Urine: NEGATIVE
GLUCOSE, UA: NEGATIVE mg/dL
Ketones, ur: 15 mg/dL — AB
Leukocytes, UA: NEGATIVE
Nitrite: NEGATIVE
Protein, ur: 30 mg/dL — AB
Specific Gravity, Urine: 1.019 (ref 1.005–1.030)
Urobilinogen, UA: 0.2 mg/dL (ref 0.0–1.0)
pH: 5 (ref 5.0–8.0)

## 2014-11-26 LAB — I-STAT TROPONIN, ED: TROPONIN I, POC: 0 ng/mL (ref 0.00–0.08)

## 2014-11-26 LAB — CBG MONITORING, ED: GLUCOSE-CAPILLARY: 100 mg/dL — AB (ref 70–99)

## 2014-11-26 LAB — MRSA PCR SCREENING: MRSA by PCR: NEGATIVE

## 2014-11-26 LAB — CK: CK TOTAL: 945 U/L — AB (ref 7–232)

## 2014-11-26 MED ORDER — ENOXAPARIN SODIUM 40 MG/0.4ML ~~LOC~~ SOLN
40.0000 mg | SUBCUTANEOUS | Status: DC
  Administered 2014-11-26 – 2014-11-30 (×5): 40 mg via SUBCUTANEOUS
  Filled 2014-11-26 (×6): qty 0.4

## 2014-11-26 MED ORDER — LORAZEPAM 2 MG/ML IJ SOLN
2.0000 mg | INTRAMUSCULAR | Status: DC | PRN
  Administered 2014-11-26: 2 mg via INTRAVENOUS
  Filled 2014-11-26: qty 1

## 2014-11-26 MED ORDER — LEVETIRACETAM IN NACL 1000 MG/100ML IV SOLN
1000.0000 mg | INTRAVENOUS | Status: AC
  Administered 2014-11-26: 1000 mg via INTRAVENOUS
  Filled 2014-11-26: qty 100

## 2014-11-26 MED ORDER — SODIUM CHLORIDE 0.9 % IV BOLUS (SEPSIS)
1000.0000 mL | Freq: Once | INTRAVENOUS | Status: AC
  Administered 2014-11-26: 1000 mL via INTRAVENOUS

## 2014-11-26 MED ORDER — SODIUM CHLORIDE 0.9 % IV SOLN
75.0000 mL/h | INTRAVENOUS | Status: DC
  Administered 2014-11-26: 75 mL/h via INTRAVENOUS
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
## 2014-11-26 NOTE — ED Notes (Signed)
Phlebotomy aware patient still needs blood.

## 2014-11-26 NOTE — ED Notes (Signed)
Pt arrived by [facility]. Pt was found by church members at home unresponsive, hx of seizures. Per ems, pt was postictal for them, will open his eyes but remains nonverbal. Was incontinent of urine pta.

## 2014-11-26 NOTE — ED Provider Notes (Signed)
Patient been unresponsive possibly since midnight on 11/25/2014. Family members found him at home lying on floor in prone position this morning. On exam patient is arousable to tactile stimulus. Opens eyes to tactile stimulus. Has purposeful movement to tactile stimulus nonverbal, Glasgow Coma Score 8 eyes pupils 2 mm equal reactive to light. Neck trachea midline no tenderness lungs clear breath sounds chest there is a 2 cm ecchymosis at the left anterior chest wall abdomen nondistended nontender all 4 extremities without redness swelling or tenderness neurovascularly intact  Orlie Dakin, MD 11/26/14 1700

## 2014-11-26 NOTE — ED Notes (Signed)
Attempted report 

## 2014-11-26 NOTE — ED Notes (Signed)
Gertie Fey, PA at bedside to eval pt
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
## 2014-11-27 ENCOUNTER — Inpatient Hospital Stay (HOSPITAL_COMMUNITY): Payer: Medicare HMO

## 2014-11-27 DIAGNOSIS — S069XAA Unspecified intracranial injury with loss of consciousness status unknown, initial encounter: Secondary | ICD-10-CM | POA: Diagnosis present

## 2014-11-27 DIAGNOSIS — S069X0S Unspecified intracranial injury without loss of consciousness, sequela: Secondary | ICD-10-CM

## 2014-11-27 DIAGNOSIS — S069X9A Unspecified intracranial injury with loss of consciousness of unspecified duration, initial encounter: Secondary | ICD-10-CM | POA: Diagnosis present

## 2014-11-27 DIAGNOSIS — G40909 Epilepsy, unspecified, not intractable, without status epilepticus: Secondary | ICD-10-CM | POA: Diagnosis present

## 2014-11-27 DIAGNOSIS — G9340 Encephalopathy, unspecified: Secondary | ICD-10-CM | POA: Diagnosis present

## 2014-11-27 LAB — CBC
HEMATOCRIT: 41.8 % (ref 39.0–52.0)
Hemoglobin: 14.5 g/dL (ref 13.0–17.0)
MCH: 31 pg (ref 26.0–34.0)
MCHC: 34.7 g/dL (ref 30.0–36.0)
MCV: 89.5 fL (ref 78.0–100.0)
PLATELETS: 173 10*3/uL (ref 150–400)
RBC: 4.67 MIL/uL (ref 4.22–5.81)
RDW: 13.3 % (ref 11.5–15.5)
WBC: 6.7 10*3/uL (ref 4.0–10.5)

## 2014-11-27 LAB — BASIC METABOLIC PANEL
Anion gap: 14 (ref 5–15)
BUN: 13 mg/dL (ref 6–23)
CALCIUM: 8.9 mg/dL (ref 8.4–10.5)
CO2: 21 meq/L (ref 19–32)
CREATININE: 0.75 mg/dL (ref 0.50–1.35)
Chloride: 110 mEq/L (ref 96–112)
GFR calc Af Amer: >90 mL/min (ref >90)
GLUCOSE: 82 mg/dL (ref 70–99)
Potassium: 4.1 mEq/L (ref 3.7–5.3)
Sodium: 145 mEq/L (ref 137–147)

## 2014-11-27 LAB — MAGNESIUM: Magnesium: 2.3 mg/dL (ref 1.5–2.5)

## 2014-11-27 MED ORDER — LEVETIRACETAM 500 MG PO TABS
500.0000 mg | ORAL_TABLET | Freq: Two times a day (BID) | ORAL | Status: DC
  Administered 2014-11-27 (×2): 500 mg via ORAL
  Filled 2014-11-27 (×4): qty 1

## 2014-11-27 MED ORDER — CARBAMAZEPINE ER 200 MG PO TB12
400.0000 mg | ORAL_TABLET | Freq: Two times a day (BID) | ORAL | Status: DC
  Administered 2014-11-27 – 2014-12-01 (×9): 400 mg via ORAL
  Filled 2014-11-27: qty 1
  Filled 2014-11-27: qty 2
  Filled 2014-11-27: qty 1
  Filled 2014-11-27: qty 2
  Filled 2014-11-27 (×8): qty 1

## 2014-11-27 NOTE — Progress Notes (Signed)
LTM 12/06/-12/07. No skin breakdown with hookup. Tested event button/ educated nurse and family member. Faxed info to [facility]

## 2014-11-27 NOTE — Progress Notes (Signed)
[facility] PROGRESS NOTE  Patrick Torres TDD:[id number] DOB: 10-03-1961 DOA: 11/26/2014 PCP: Elizabeth Palau, MD  Assessment/Plan: #1 acute encephalopathy Unknown etiology. Likely secondary to seizure disorder as patient was noted to be in post ictal state on admission. Patient was also noted to be incontinent. Patient can't remember what happened. MRI of the head is pending. CT head negative. Patient undergoing continuous EEG at this time. No signs or symptoms of infection. Patient received a dose of IV Keppra in the ED. Tegretol levels within normal limits. Continue seizure precautions.??? Maintenance dose Keppra and Tegretol however will defer to neurology. Ativan as needed. Neurology following and appreciate input and recommendations.  #2 history of seizure disorder Patient currently undergoing EEG. MRI of the head is pending. Patient was given a dose of IV Keppra in the emergency room. Tegretol levels within normal limits. Will defer whether to resume patient's home regimen of Tegretol and Keppra to neurology. Ativan as needed. Seizure precautions. Neurology following and appreciate her per the recommendations.  #3 history of traumatic brain injury Stable.  #4 prophylaxis Lovenox for DVT prophylaxis.  Code Status: Full Family Communication: Updated patient and family at bedside. Disposition Plan: Remaining step down unit   Consultants:  Neurology: Dr. Janann Colonel 11/26/2014  Procedures:  CT head 11/26/2014  Chest x-ray 11/26/2014  Antibiotics:  None  HPI/Subjective: Patient with no complaints. Continues EEG ongoing. Patient can remember what happened.  Objective: Filed Vitals:   11/27/14 0400  BP: 127/82  Pulse: 83  Temp: 98.2 F (36.8 C)  Resp: 11    Intake/Output Summary (Last 24 hours) at 11/27/14 0827 Last data filed at 11/27/14 0400  Gross per 24 hour  Intake  112.5 ml  Output    500 ml  Net -387.5 ml   Filed Weights   11/26/14 1535  Weight:  91.9 kg (202 lb 9.6 oz)    Exam:   General:  NAD.  Cardiovascular: RRR  Respiratory: CTAB anterior lung fields  Abdomen: Soft, nontender, nondistended, positive bowel sounds.  Musculoskeletal: No clubbing cyanosis or edema.  Data Reviewed: Basic Metabolic Panel:  Recent Labs Lab 11/26/14 1309  NA 146  K 3.9  CL 109  GLUCOSE 101*  BUN 16  CREATININE 0.90   Liver Function Tests: No results for input(s): AST, ALT, ALKPHOS, BILITOT, PROT, ALBUMIN in the last 168 hours. No results for input(s): LIPASE, AMYLASE in the last 168 hours. No results for input(s): AMMONIA in the last 168 hours. CBC:  Recent Labs Lab 11/26/14 1255 11/26/14 1309  WBC 9.8  --   NEUTROABS 7.8*  --   HGB 15.4 16.0  HCT 44.5 47.0  MCV 90.6  --   PLT 184  --    Cardiac Enzymes:  Recent Labs Lab 11/26/14 1255  CKTOTAL 945*   BNP (last 3 results) No results for input(s): PROBNP in the last 8760 hours. CBG:  Recent Labs Lab 11/26/14 1052  GLUCAP 100*    Recent Results (from the past 240 hour(s))  MRSA PCR Screening     Status: None   Collection Time: 11/26/14  4:32 PM  Result Value Ref Range Status   MRSA by PCR NEGATIVE NEGATIVE Final    Comment:        The GeneXpert MRSA Assay (FDA approved for NASAL specimens only), is one component of a comprehensive MRSA colonization surveillance program. It is not intended to diagnose MRSA infection nor to guide or monitor treatment for MRSA infections.      Studies: Ct  Head Wo Contrast  11/26/2014   CLINICAL DATA:  Acute altered mental status, unresponsive  EXAM: CT HEAD WITHOUT CONTRAST  TECHNIQUE: Contiguous axial images were obtained from the base of the skull through the vertex without contrast.  COMPARISON:  None  FINDINGS: Study is limited with motion artifact on several images.  Mild diffuse brain atrophy without acute intracranial hemorrhage, mass lesion, or gross infarction. No definite focal mass effect or edema. No  midline shift, hydrocephalus, or extra-axial fluid collection. Cisterns are patent. Cerebellar atrophy as well. Orbits are symmetric. Mastoids are clear. Bilateral mucosal thickening of the ethmoid and maxillary sinuses with air-fluid levels, compatible with sinusitis.  IMPRESSION: Limited exam with motion artifact. No gross acute intracranial abnormality. Diffuse brain atrophy.  Ethmoid and maxillary sinusitis.   Electronically Signed   By: Daryll Brod M.D.   On: 11/26/2014 11:34   Dg Chest Port 1 View  11/26/2014   CLINICAL DATA:  Acute altered mental status  EXAM: PORTABLE CHEST - 1 VIEW  COMPARISON:  None.  FINDINGS: Low volume exam accentuating the heart size. Basilar atelectasis noted. No definite pneumonia or edema. Negative for effusion or pneumothorax. Trachea midline. No acute osseous finding.  IMPRESSION: Low volume exam without acute process   Electronically Signed   By: Daryll Brod M.D.   On: 11/26/2014 11:24    Scheduled Meds: . enoxaparin (LOVENOX) injection  40 mg Subcutaneous Q24H   Continuous Infusions: . sodium chloride 75 mL/hr (11/26/14 1630)    Principal Problem:   Acute encephalopathy Active Problems:   Altered mental state   Seizure   TBI (traumatic brain injury)    Time spent: 77 minutes    THOMPSON,DANIEL M.D. [facility] Pager [phone]. If 7PM-7AM, please contact night-coverage at [email or web address], password [facility] 11/27/2014, 8:27 AM  LOS: 1 day
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
## 2014-11-27 NOTE — Clinical Social Work Psychosocial (Signed)
Clinical Social Work Department BRIEF PSYCHOSOCIAL ASSESSMENT 11/27/2014  Patient:  Patrick Torres, Patrick Torres     Account Number:  [id number]     Admit date:  11/26/2014  Clinical Social Worker:  Marciano Sequin  Date/Time:  11/27/2014 12:08 PM  Referred by:  RN  Date Referred:  11/27/2014 Referred for  Psychosocial assessment   Other Referral:   Interview type:  Patient Other interview type:    PSYCHOSOCIAL DATA Living Status:  ALONE Admitted from facility:   Level of care:   Primary support name:  Patrick Torres Primary support relationship to patient:  SIBLING Degree of support available:   Strong Support System    CURRENT CONCERNS Current Concerns  Adjustment to Illness   Other Concerns:    SOCIAL WORK ASSESSMENT / PLAN CSW met the pt and the pt's sister Patrick Torres at bedside. CSW introduced self and purpose of the visit. Pt reported he currently lives at home alone. Patrick Torres expressed concern regarding the pt's history of seizures and him living alone. Patrick Torres requested community resources. CSW provided the pt and Patrick Torres with [place] [phone] and the [facility] [place] [facility] [place] [phone]. CSW encouraged the pt to call [employer] at [phone] to review his benefits. In addition to talking to his PCP. CSW provided the pt with contact information for further questions. CSW will sign off.   Assessment/plan status:  No Further Intervention Required Other assessment/ plan:   Information/referral to community resources:    PATIENT'S/FAMILY'S RESPONSE TO PLAN OF CARE: Pt presented with a calm mood and flat affect. Pt's sister presented with a upset mood. Pt's sister reported being upset due to the lack of community resources that can pay for the pt to receive a life alert neckless.   [place], MSW, [place]

## 2014-11-27 NOTE — Procedures (Addendum)
EEG report.  Brief clinical history: 53y/o gentleman with history of seizure disorder, TBI, MCI presenting with profound encephalopathy after being found down for an unknown amount of time. Concern is for post-ictal state vs possible prolonged non-convulsive status epilepticus  Technique: this is a 17 channel routine scalp EEG performed at the bedside with bipolar and monopolar montages arranged in accordance to the international 10/20 system of electrode placement. One channel was dedicated to EKG recording.  The study was performed during wakefulness, drowsiness, and stage 2 sleep. No activating procedures performed.  Description:In the wakeful state, the best background consisted of a medium amplitude 5 to 6 Hz rhythm. Drowsiness demonstrated dropout of the alpha rhythm. Stage 2 sleep showed symmetric and synchronous sleep spindles with intermixed generalized epileptiform discharges.. Frequent intermittent bursts of generalized medium amplitude spikes and at times polyspike and waves noted, mostly over the left hemisphere. These discharges at times occur independently at C3, P3, and often also develop as repetitive trains for few seconds. No electrographic seizures noted.  There is theta slowing involving the left temporal region.  EKG showed sinus rhythm.  Impression: this is an abnormal awake and asleep EEG with overall findings consistent with the interictal expression of a generalized epilepsy. No electrographic seizures noted. Clinical correlation is advised.  Dorian Pod, MD

## 2014-11-27 NOTE — Progress Notes (Signed)
Subjective: Patient awake, alert and oriented, following commands. Wife at bedside feels he is still slower than usual in mentation but improving.   Objective: Current vital signs: BP 138/80 mmHg  Pulse 69  Temp(Src) 97.9 F (36.6 C) (Oral)  Resp 17  Ht 5\' 8"  (1.727 m)  Wt 91.9 kg (202 lb 9.6 oz)  BMI 30.81 kg/m2  SpO2 98% Vital signs in last 24 hours: Temp:  [97.9 F (36.6 C)-99.4 F (37.4 C)] 97.9 F (36.6 C) (12/07 1200) Pulse Rate:  [64-83] 69 (12/07 1200) Resp:  [11-21] 17 (12/07 1200) BP: (124-138)/(64-82) 138/80 mmHg (12/07 1200) SpO2:  [97 %-100 %] 98 % (12/07 1200) Weight:  [91.9 kg (202 lb 9.6 oz)] 91.9 kg (202 lb 9.6 oz) (12/06 1535)  Intake/Output from previous day: 12/06 0701 - 12/07 0700 In: 187.5 [I.V.:187.5] Out: 500 [Urine:500] Intake/Output this shift: Total I/O In: 375 [I.V.:375] Out: -  Nutritional status: Diet NPO time specified  Neurologic Exam: General: NAD Mental Status: Alert, initially believed it was November and stated the wrong date but after thinking he corrected himself and stated the correct date. thought content appropriate.  Speech fluent without evidence of aphasia.  Able to follow 3 step commands without difficulty. Cranial Nerves: II: Discs flat bilaterally; Visual fields grossly normal, pupils equal, round, reactive to light and accommodation III,IV, VI: ptosis not present, extra-ocular motions intact bilaterally V,VII: smile symmetric, facial light touch sensation normal bilaterally VIII: hearing normal bilaterally IX,X: gag reflex present XI: bilateral shoulder shrug XII: midline tongue extension without atrophy or fasciculations  Motor: Right : Upper extremity   5/5    Left:     Upper extremity   5/5  Lower extremity   5/5     Lower extremity   5/5 Tone and bulk:normal tone throughout; no atrophy noted Sensory: Pinprick and light touch intact throughout, bilaterally Deep Tendon Reflexes:  Right: Upper Extremity   Left:  Upper extremity   biceps (C-5 to C-6) 2/4   biceps (C-5 to C-6) 2/4 tricep (C7) 2/4    triceps (C7) 2/4 Brachioradialis (C6) 2/4  Brachioradialis (C6) 2/4  Lower Extremity Lower Extremity  quadriceps (L-2 to L-4) 2/4   quadriceps (L-2 to L-4) 2/4 Achilles (S1) 2/4   Achilles (S1) 2/4  Plantars: Right: downgoing   Left: downgoing Cerebellar: normal finger-to-nose,  normal heel-to-shin test    Lab Results: Basic Metabolic Panel:  Recent Labs Lab 11/26/14 1309 11/27/14 0925  NA 146 145  K 3.9 4.1  CL 109 110  CO2  --  21  GLUCOSE 101* 82  BUN 16 13  CREATININE 0.90 0.75  CALCIUM  --  8.9  MG  --  2.3    Liver Function Tests: No results for input(s): AST, ALT, ALKPHOS, BILITOT, PROT, ALBUMIN in the last 168 hours. No results for input(s): LIPASE, AMYLASE in the last 168 hours. No results for input(s): AMMONIA in the last 168 hours.  CBC:  Recent Labs Lab 11/26/14 1255 11/26/14 1309 11/27/14 0925  WBC 9.8  --  6.7  NEUTROABS 7.8*  --   --   HGB 15.4 16.0 14.5  HCT 44.5 47.0 41.8  MCV 90.6  --  89.5  PLT 184  --  173    Cardiac Enzymes:  Recent Labs Lab 11/26/14 1255  CKTOTAL 945*    Lipid Panel: No results for input(s): CHOL, TRIG, HDL, CHOLHDL, VLDL, LDLCALC in the last 168 hours.  CBG:  Recent Labs Lab 11/26/14 [place]  100*    Microbiology: Results for orders placed or performed during the hospital encounter of 11/26/14  MRSA PCR Screening     Status: None   Collection Time: 11/26/14  4:32 PM  Result Value Ref Range Status   MRSA by PCR NEGATIVE NEGATIVE Final    Comment:        The GeneXpert MRSA Assay (FDA approved for NASAL specimens only), is one component of a comprehensive MRSA colonization surveillance program. It is not intended to diagnose MRSA infection nor to guide or monitor treatment for MRSA infections.     Coagulation Studies: No results for input(s): LABPROT, INR in the last 72 hours.  Imaging: Ct Head  Wo Contrast  11/26/2014   CLINICAL DATA:  Acute altered mental status, unresponsive  EXAM: CT HEAD WITHOUT CONTRAST  TECHNIQUE: Contiguous axial images were obtained from the base of the skull through the vertex without contrast.  COMPARISON:  None  FINDINGS: Study is limited with motion artifact on several images.  Mild diffuse brain atrophy without acute intracranial hemorrhage, mass lesion, or gross infarction. No definite focal mass effect or edema. No midline shift, hydrocephalus, or extra-axial fluid collection. Cisterns are patent. Cerebellar atrophy as well. Orbits are symmetric. Mastoids are clear. Bilateral mucosal thickening of the ethmoid and maxillary sinuses with air-fluid levels, compatible with sinusitis.  IMPRESSION: Limited exam with motion artifact. No gross acute intracranial abnormality. Diffuse brain atrophy.  Ethmoid and maxillary sinusitis.   Electronically Signed   By: Daryll Brod M.D.   On: 11/26/2014 11:34   Dg Chest Port 1 View  11/26/2014   CLINICAL DATA:  Acute altered mental status  EXAM: PORTABLE CHEST - 1 VIEW  COMPARISON:  None.  FINDINGS: Low volume exam accentuating the heart size. Basilar atelectasis noted. No definite pneumonia or edema. Negative for effusion or pneumothorax. Trachea midline. No acute osseous finding.  IMPRESSION: Low volume exam without acute process   Electronically Signed   By: Daryll Brod M.D.   On: 11/26/2014 11:24    Medications:  Scheduled: . carbamazepine  400 mg Oral BID  . enoxaparin (LOVENOX) injection  40 mg Subcutaneous Q24H  . levETIRAcetam  500 mg Oral BID    Assessment/Plan: 53y/o gentleman with history of seizure disorder, TBI, MCI presenting with profound encephalopathy after being found down for an unknown amount of time. Concern is for post-ictal state vs possible prolonged non-convulsive status epilepticus. Tegretol level therapeutic. Currently patient has improved significantly and bale to follow commands and answer  questions appropriately. EEG obtained pending reading. MRI pending. Patient has improved significantly thus LP will be deferred.    Recommend: 1) MRI brain--will follow 2) EEG --pending reading 3) Continue home dose of AED.     Etta Quill PA-C [facility] [phone]  11/27/2014, 1:00 PM

## 2014-11-28 DIAGNOSIS — E876 Hypokalemia: Secondary | ICD-10-CM

## 2014-11-28 DIAGNOSIS — R1310 Dysphagia, unspecified: Secondary | ICD-10-CM

## 2014-11-28 LAB — CBC
HCT: 41.5 % (ref 39.0–52.0)
Hemoglobin: 13.9 g/dL (ref 13.0–17.0)
MCH: 30 pg (ref 26.0–34.0)
MCHC: 33.5 g/dL (ref 30.0–36.0)
MCV: 89.6 fL (ref 78.0–100.0)
PLATELETS: 137 10*3/uL — AB (ref 150–400)
RBC: 4.63 MIL/uL (ref 4.22–5.81)
RDW: 13.1 % (ref 11.5–15.5)
WBC: 6.8 10*3/uL (ref 4.0–10.5)

## 2014-11-28 LAB — BASIC METABOLIC PANEL
ANION GAP: 13 (ref 5–15)
BUN: 11 mg/dL (ref 6–23)
CALCIUM: 8.4 mg/dL (ref 8.4–10.5)
CO2: 21 mEq/L (ref 19–32)
Chloride: 108 mEq/L (ref 96–112)
Creatinine, Ser: 0.76 mg/dL (ref 0.50–1.35)
GFR calc Af Amer: >90 mL/min (ref >90)
Glucose, Bld: 91 mg/dL (ref 70–99)
Potassium: 3.4 mEq/L — ABNORMAL LOW (ref 3.7–5.3)
SODIUM: 142 meq/L (ref 137–147)

## 2014-11-28 LAB — MAGNESIUM: MAGNESIUM: 2 mg/dL (ref 1.5–2.5)

## 2014-11-28 LAB — CARBAMAZEPINE LEVEL, TOTAL: Carbamazepine Lvl: 6.5 ug/mL (ref 4.0–12.0)

## 2014-11-28 MED ORDER — LEVETIRACETAM 500 MG PO TABS
1000.0000 mg | ORAL_TABLET | Freq: Two times a day (BID) | ORAL | Status: DC
  Administered 2014-11-28 – 2014-12-01 (×7): 1000 mg via ORAL
  Filled 2014-11-28 (×11): qty 2

## 2014-11-28 MED ORDER — POTASSIUM CHLORIDE CRYS ER 20 MEQ PO TBCR
40.0000 meq | EXTENDED_RELEASE_TABLET | ORAL | Status: AC
  Administered 2014-11-28 (×2): 40 meq via ORAL
  Filled 2014-11-28: qty 2

## 2014-11-28 MED ORDER — NAPROXEN SODIUM 275 MG PO TABS: 440.0000 mg | ORAL_TABLET | Freq: Four times a day (QID) | ORAL | Status: DC | PRN

## 2014-11-28 MED ORDER — NAPROXEN 250 MG PO TABS: 500.0000 mg | ORAL_TABLET | Freq: Three times a day (TID) | ORAL | Status: DC | PRN

## 2014-11-28 NOTE — Evaluation (Signed)
Occupational Therapy Evaluation Patient Details Name: Patrick Torres MRN: [id number] DOB: 1961-07-31 Today's Date: 11/28/2014    History of Present Illness 53 yo male admitted due to being found down s/p seizure and AMS. Pt lives at home alone. Church members located patietn after missing church event.  CT negative, chest xray shows infectionPMH: TBI, seizure disorder on keppra and tegretol,    Clinical Impression   PT admitted with s/p fall and seizures.. Pt currently with functional limitiations due to the deficits listed below (see OT problem list). PTA living at home alone and independent. Pt will benefit from skilled OT to increase their independence and safety with adls and balance to allow discharge SNF due to lack of 24 /7 (A ). Pt very high fall risk at this time. OT to follow acutely for dynamic balance during adls.     Follow Up Recommendations  SNF;Supervision/Assistance - 24 hour    Equipment Recommendations  None recommended by OT    Recommendations for Other Services       Precautions / Restrictions Precautions Precautions: Fall Precaution Comments: hx of seizures, extreme LOB to the R Restrictions Weight Bearing Restrictions: No      Mobility Bed Mobility               General bed mobility comments: in chair on arrival  Transfers Overall transfer level: Needs assistance Equipment used: Rolling walker (2 wheeled) Transfers: Sit to/from Stand Sit to Stand: Min guard         General transfer comment: using bil UE on chair and rw correctly    Balance Overall balance assessment: Needs assistance Sitting-balance support: Feet supported;No upper extremity supported Sitting balance-Leahy Scale: Good   Postural control: Right lateral lean;Posterior lean (in standing) Standing balance support: No upper extremity supported;During functional activity Standing balance-Leahy Scale: Poor Standing balance comment: Pt with heavy LOB to the R several times  during standing and gait.                             ADL Overall ADL's : Needs assistance/impaired     Grooming: Oral care;Standing Grooming Details (indicate cue type and reason): pt with x4 LOB during oral care task. pt with constant posterior lean with ankle strategies attempting to stay upright. Pt with LOB in all directions during session. Pt unabel to self correct                 Toilet Transfer: Min Fish farm manager Details (indicate cue type and reason): requires bil UE          Functional mobility during ADLs: Minimal assistance;Rolling walker General ADL Comments: pt with lOB with head turns, static standing at sink with no UE support, completing oral care in static standing.     Vision Eye Alignment: Within Functional Limits Alignment/Gaze Preference: Within Defined Limits Ocular Range of Motion: Within Functional Limits Tracking/Visual Pursuits: Decreased smoothness of horizontal tracking   Convergence: Impaired (comment)     Additional Comments: pt loosing object with visual tracking and jumping ahead   Perception     Praxis      Pertinent Vitals/Pain Pain Assessment: No/denies pain     Hand Dominance Right   Extremity/Trunk Assessment Upper Extremity Assessment Upper Extremity Assessment: Generalized weakness (ataxic movement, dropping objects throughout session)   Lower Extremity Assessment Lower Extremity Assessment: Defer to PT evaluation   Cervical / Trunk Assessment Cervical / Trunk Assessment: Normal   Communication  Communication Communication: No difficulties   Cognition Arousal/Alertness: Awake/alert Behavior During Therapy: WFL for tasks assessed/performed Overall Cognitive Status: Impaired/Different from baseline Area of Impairment: Memory;Safety/judgement;Awareness     Memory: Decreased recall of precautions   Safety/Judgement: Decreased awareness of safety;Decreased awareness of deficits Awareness:  Emergent   General Comments: decr sequence, pt recalled information from PT Raquel Sarna and reports need to walk with a walker. Pt able to recall reason for admission was falling and medical staff is reporting seizures   General Comments       Exercises       Shoulder Instructions      Home Living Family/patient expects to be discharged to:: Private residence Living Arrangements: Alone   Type of Home: House Home Access: Stairs to enter Technical brewer of Steps: 2 Entrance Stairs-Rails: None Home Layout: One level         Biochemist, clinical: Standard     Home Equipment: None   Additional Comments: sister lives 30 mins away and works during the day.       Prior Functioning/Environment Level of Independence: Independent             OT Diagnosis: Generalized weakness;Cognitive deficits;Ataxia   OT Problem List: Decreased strength;Decreased activity tolerance;Impaired balance (sitting and/or standing);Decreased safety awareness;Decreased knowledge of use of DME or AE;Decreased knowledge of precautions   OT Treatment/Interventions: Self-care/ADL training;Therapeutic exercise;DME and/or AE instruction;Therapeutic activities;Balance training;Patient/family education    OT Goals(Current goals can be found in the care plan section) Acute Rehab OT Goals Patient Stated Goal: pt reports need to get a new phone. Pt reports difficulty using phone OT Goal Formulation: With patient Time For Goal Achievement: 12/12/14 Potential to Achieve Goals: Good  OT Frequency: Min 2X/week   Barriers to D/C:            Co-evaluation              End of Session Equipment Utilized During Treatment: Rolling walker Nurse Communication: Mobility status;Precautions  Activity Tolerance: Patient tolerated treatment well Patient left: in chair;with call bell/phone within reach;with chair alarm set;with nursing/sitter in room   Time: 1430-1444 OT Time Calculation (min): 14  min Charges:  OT General Charges $OT Visit: 1 Procedure OT Evaluation $Initial OT Evaluation Tier I: 1 Procedure OT Treatments $Self Care/Home Management : 8-22 mins G-Codes:    Peri Maris 12-22-14, 3:19 PM Pager: [phone]

## 2014-11-28 NOTE — Progress Notes (Signed)
Nutrition Brief Note  Patient identified on the Malnutrition Screening Tool (MST) Report for weight loss. Weight loss of 4% over two months is not significant. Appetite and PO intake were good PTA.  Wt Readings from Last 15 Encounters:  11/26/14 202 lb 9.6 oz (91.9 kg)  09/26/14 211 lb (95.709 kg)  09/27/13 214 lb (97.07 kg)  09/29/12 219 lb (99.338 kg)  06/05/12 215 lb (97.523 kg)  11/20/11 210 lb (95.255 kg)    Body mass index is 30.81 kg/(m^2). Patient meets criteria for class 1 obesity based on current BMI.   Current diet order is dysphagia 3 with thin liquids. Labs and medications reviewed.   No nutrition interventions warranted at this time. If nutrition issues arise, please consult RD.    Molli Barrows, RD, LDN, [place] Pager [phone] After Hours Pager [phone]

## 2014-11-28 NOTE — Progress Notes (Signed)
Speech Language Pathology Treatment: Dysphagia  Patient Details Name: Patrick Torres MRN: [id number] DOB: [birth year] Today's Date: 11/28/2014 Time: 4103-0131 SLP Time Calculation (min) (ACUTE ONLY): 11 min  Assessment / Plan / Recommendation Clinical Impression  F/u after yesterday's swallow assessment.  Pt with resolved dysphagia now that MS has begun to improve (although not yet to baseline per sister.)   Consumed regular solids, thin liquids with adequate mastication, no s/s of aspiration despite large, successive boluses of thin from a straw.  No further SLP f/u warranted.  Will sign off.  If pt's cognitive status does not resolve, recommend SLP evaluation for cognition.  (Currently with paucity of verbal output, flat affect, delayed response time, impaired recall, all behaviors inconsistent with prior function per sister. )   HPI HPI: 53 yo male with PMH significant for TBI, mild cognitive impairment, and epilepsy was admitted 11/26/14 with AMS. MRI pending, CT Head with diffuse atrophy.   Pertinent Vitals Pain Assessment: No/denies pain  SLP Plan  All goals met    Recommendations Diet recommendations: Regular;Thin liquid Liquids provided via: Cup;Straw Medication Administration: Whole meds with liquid Supervision: Patient able to self feed              Oral Care Recommendations: Oral care BID Follow up Recommendations: None Plan: All goals met    Patrick Torres, [place] CCC/SLP Pager [phone]      Patrick Torres 11/28/2014, 11:02 AM

## 2014-11-28 NOTE — Procedures (Signed)
EEG report.  Brief clinical history: 53y/o gentleman with history of seizure disorder, TBI, MCI presenting with profound encephalopathy after being found down for an unknown amount of time. Concern is for post-ictal state vs possible prolonged non-convulsive status epilepticus Routine scalp EEG revealed frequent intermittent bursts of generalized medium amplitude spikes and at times polyspike and waves noted, mostly over the left hemisphere. These discharges at times occur independently at C3, P3, and often also develop as repetitive trains for few seconds. No electrographic seizures noted.  There is theta slowing involving the left temporal region.   Technique: this is a continuous 21 channel EEG with video performed at the bedside with bipolar montages arranged in accordance to the international 10/20 system of electrode placement. One channel was dedicated to EKG recording. The study started at 4:20 pm 11/26/14 and ended at 9:09 am 11/27/14  No activating procedures.  Description: patient is awake during brief periods of time, as remains asleep for the vast majority of the study.  Infrequent burst of spikes and spikes and wave discharges were note mainly ovever the left hemisphere, mostly during sleep. No electrographic seizures noted. No clinical paroxysmal events observed. EKG showed sinus rhythm.  Impression: this is an abnormal prolonged EEG with findings consistent with the interictal expression of a generalized epilepsy . Clinical correlation is advised.   Dorian Pod, MD

## 2014-11-28 NOTE — Progress Notes (Signed)
Subjective: Patient has had no further seizures over night.  He feels back to his baseline.   EEG--Frequent intermittent bursts of generalized medium amplitude spikes and at times polyspike and waves noted, mostly over the left hemisphere. These discharges at times occur independently at C3, P3, and often also develop as repetitive trains for few seconds. No electrographic seizures noted.   Objective: Current vital signs: BP 125/91 mmHg  Pulse 56  Temp(Src) 98 F (36.7 C) (Oral)  Resp 14  Ht 5\' 8"  (1.727 m)  Wt 91.9 kg (202 lb 9.6 oz)  BMI 30.81 kg/m2  SpO2 100% Vital signs in last 24 hours: Temp:  [97.8 F (36.6 C)-99.1 F (37.3 C)] 98 F (36.7 C) (12/08 0707) Pulse Rate:  [56-83] 56 (12/08 0707) Resp:  [14-19] 14 (12/08 0707) BP: (124-139)/(76-91) 125/91 mmHg (12/08 0707) SpO2:  [98 %-100 %] 100 % (12/08 0707)  Intake/Output from previous day: 12/07 0701 - 12/08 0700 In: 1725 [I.V.:1725] Out: 825 [Urine:825] Intake/Output this shift: Total I/O In: 75 [I.V.:75] Out: -  Nutritional status: DIET DYS 3  Neurologic Exam: General: NAD Mental Status: Alert, oriented, thought content appropriate.  Speech slow and slightly dysarthric without evidence of aphasia.  Able to follow 3 step commands without difficulty. Cranial Nerves: II:  Visual fields grossly normal, pupils equal, round, reactive to light and accommodation III,IV, VI: ptosis not present, extra-ocular motions intact bilaterally V,VII: smile symmetric, facial light touch sensation normal bilaterally VIII: hearing normal bilaterally IX,X: gag reflex present XI: bilateral shoulder shrug XII: midline tongue extension without atrophy or fasciculations  Motor: Right : Upper extremity   5/5    Left:     Upper extremity   5/5  Lower extremity   5/5     Lower extremity   5/5 Tone and bulk:normal tone throughout; no atrophy noted Sensory: Pinprick and light touch intact throughout, bilaterally Deep Tendon Reflexes:   Right: Upper Extremity   Left: Upper extremity   biceps (C-5 to C-6) 2/4   biceps (C-5 to C-6) 2/4 tricep (C7) 2/4    triceps (C7) 2/4 Brachioradialis (C6) 2/4  Brachioradialis (C6) 2/4  Lower Extremity Lower Extremity  quadriceps (L-2 to L-4) 2/4   quadriceps (L-2 to L-4) 2/4 Achilles (S1) 2/4   Achilles (S1) 2/4  Plantars: Right: downgoing   Left: downgoing Cerebellar: normal finger-to-nose,  normal heel-to-shin test     Lab Results: Basic Metabolic Panel:  Recent Labs Lab 11/26/14 1309 11/27/14 0925 11/28/14 0313  NA 146 145 142  K 3.9 4.1 3.4*  CL 109 110 108  CO2  --  21 21  GLUCOSE 101* 82 91  BUN 16 13 11   CREATININE 0.90 0.75 0.76  CALCIUM  --  8.9 8.4  MG  --  2.3  --     Liver Function Tests: No results for input(s): AST, ALT, ALKPHOS, BILITOT, PROT, ALBUMIN in the last 168 hours. No results for input(s): LIPASE, AMYLASE in the last 168 hours. No results for input(s): AMMONIA in the last 168 hours.  CBC:  Recent Labs Lab 11/26/14 1255 11/26/14 1309 11/27/14 0925 11/28/14 0313  WBC 9.8  --  6.7 6.8  NEUTROABS 7.8*  --   --   --   HGB 15.4 16.0 14.5 13.9  HCT 44.5 47.0 41.8 41.5  MCV 90.6  --  89.5 89.6  PLT 184  --  173 137*    Cardiac Enzymes:  Recent Labs Lab 11/26/14 1255  CKTOTAL 945*  Lipid Panel: No results for input(s): CHOL, TRIG, HDL, CHOLHDL, VLDL, LDLCALC in the last 168 hours.  CBG:  Recent Labs Lab 11/26/14 1052  GLUCAP 100*    Microbiology: Results for orders placed or performed during the hospital encounter of 11/26/14  MRSA PCR Screening     Status: None   Collection Time: 11/26/14  4:32 PM  Result Value Ref Range Status   MRSA by PCR NEGATIVE NEGATIVE Final    Comment:        The GeneXpert MRSA Assay (FDA approved for NASAL specimens only), is one component of a comprehensive MRSA colonization surveillance program. It is not intended to diagnose MRSA infection nor to guide or monitor treatment  for MRSA infections.     Coagulation Studies: No results for input(s): LABPROT, INR in the last 72 hours.  Imaging: Ct Head Wo Contrast  11/26/2014   CLINICAL DATA:  Acute altered mental status, unresponsive  EXAM: CT HEAD WITHOUT CONTRAST  TECHNIQUE: Contiguous axial images were obtained from the base of the skull through the vertex without contrast.  COMPARISON:  None  FINDINGS: Study is limited with motion artifact on several images.  Mild diffuse brain atrophy without acute intracranial hemorrhage, mass lesion, or gross infarction. No definite focal mass effect or edema. No midline shift, hydrocephalus, or extra-axial fluid collection. Cisterns are patent. Cerebellar atrophy as well. Orbits are symmetric. Mastoids are clear. Bilateral mucosal thickening of the ethmoid and maxillary sinuses with air-fluid levels, compatible with sinusitis.  IMPRESSION: Limited exam with motion artifact. No gross acute intracranial abnormality. Diffuse brain atrophy.  Ethmoid and maxillary sinusitis.   Electronically Signed   By: Daryll Brod M.D.   On: 11/26/2014 11:34   Mr Brain Wo Contrast  11/27/2014   CLINICAL DATA:  53 year old male found down at home, unconscious. Current history of traumatic brain injury and seizures. Initial encounter.  EXAM: MRI HEAD WITHOUT CONTRAST  TECHNIQUE: Multiplanar, multiecho pulse sequences of the brain and surrounding structures were obtained without intravenous contrast.  COMPARISON:  Head CT without contrast 11/26/2014.  FINDINGS: Small bilateral CSF intensity extra-axial collections (series 10, image 10) seem increased compared to the recent CT. No other extra-axial collection. Dural calcifications. There may be chronic hemorrhage in the left cerebellum at the sub up enema of the fourth ventricle (series 7, image 38), but no acute intracranial hemorrhage is identified.  Subtle increased diffusion signal associated with the right caudate on series 4, image 23, but without  associated T2 or FLAIR hyperintensity, and no definite restricted diffusion on ADC map. No restricted diffusion or evidence of acute infarction. Major intracranial vascular flow voids are preserved, dominant appearing distal right vertebral artery. No ventriculomegaly. No midline shift or significant intracranial mass effect. Negative pituitary, cervicomedullary junction and visualized cervical spine. Negative bone marrow signal, evidence of calvarium hyperostosis.  There is cerebellar volume loss. Minimal nonspecific cerebral white matter T2 and FLAIR hyperintensity. There is mild asymmetric increased T2 hyperintensity in the left hippocampal complex (Series 10, image 11). It hippocampal volume appears symmetric. Other mesial temporal lobe structures are within normal limits.  Visible internal auditory structures appear normal. Mastoids are clear. Continued paranasal sinus mucosal thickening and fluid. Grossly normal orbits soft tissues, degraded by motion artifact. Visualized scalp soft tissues are within normal limits.  IMPRESSION: 1. Small bilateral CSF hygromas. These are nonspecific and can be the sequelae of trauma. These seem mildly increased from the recent CT, but acuity is uncertain. No significant intracranial mass effect. 2. No  definite acute infarct. Mildly increased T2 signal in the left hippocampal complex, might relate to recent seizure activity. 3. Age advanced cerebellar volume loss is nonspecific and along with hyperostosis of the calvarium can be seen as the sequelae of chronic anti seizure therapy.   Electronically Signed   By: Lars Pinks M.D.   On: 11/27/2014 18:50   Dg Chest Port 1 View  11/26/2014   CLINICAL DATA:  Acute altered mental status  EXAM: PORTABLE CHEST - 1 VIEW  COMPARISON:  None.  FINDINGS: Low volume exam accentuating the heart size. Basilar atelectasis noted. No definite pneumonia or edema. Negative for effusion or pneumothorax. Trachea midline. No acute osseous finding.   IMPRESSION: Low volume exam without acute process   Electronically Signed   By: Daryll Brod M.D.   On: 11/26/2014 11:24    Medications:  Scheduled: . carbamazepine  400 mg Oral BID  . enoxaparin (LOVENOX) injection  40 mg Subcutaneous Q24H  . levETIRAcetam  500 mg Oral BID  . potassium chloride  40 mEq Oral Q4H    Assessment/Plan: 53y/o gentleman with history of seizure disorder, TBI, MCI presenting with profound encephalopathy after being found down for an unknown amount of time. Tegretol level therapeutic. Currently patient has improved significantly and bale to follow commands and answer questions appropriately. EEG obtained showing frequent intermittent bursts of generalized medium amplitude spikes and at times polyspike and waves noted, mostly over the left hemisphere. Due to the EEG findings we will increase his Keppra to 1000 mg BID. MRI shows no acute stroke and some T2 changes likely secondary to seizure.  Patient continues to improved significantly thus LP will be deferred.    Will follow    Etta Quill PA-C [facility] [phone]  11/28/2014, 9:00 AM

## 2014-11-28 NOTE — Progress Notes (Signed)
Patient report called to 4N RN-VSS, meds current to time. Family aware of new room.  All belongs packed and sent with patient to new room.

## 2014-11-28 NOTE — Evaluation (Signed)
Physical Therapy Evaluation Patient Details Name: Patrick Torres MRN: [id number] DOB: August 31, 1961 Today's Date: 11/28/2014   History of Present Illness  With a known history of seizure disorder on Keppra as well as Tegretol was brought to the emergency department for altered mental status. It seems that patient was found face down at home and had apparently become incontinent. Patient does reside at home by himself. He does also have a history of commanding brain injury. It seems the patient did not show up to a church function this past Friday at which point church members had become worried and tried calling him. Per family at bedside, patient had not had any type of contact with anyone over the past 24-48 hours as his phone had not been used.  It seems a church member stopped by and found the patient face down, his door was unlocked. Patient was also found to be unconscious. He was brought to the emergency department for further evaluation. In the ER, CT of the head was negative, chest x-ray showed no infection. CBC as well as BMP were within normal limits. Tegretol, Keppra, CK levels are currently pending. [facility] was called for admission.  Clinical Impression  Pt demonstrates severe balance deficits with somewhat ataxic movements in LEs.  Pt aware of balance deficits, however demonstrates decreased emergent awareness during gait and mobility tasks.  Tolerated ambulation in hallway with and without AD.  Requires up to max A due to overt LOB.  Pts sister present at end of session and discussed pts balance deficits.   PT recommends SNF for follow up due to continued balance and cognitive deficits.  Pts sister would like to speak with pts daughter to discuss.  CSW notified.     Follow Up Recommendations SNF;Supervision/Assistance - 24 hour    Equipment Recommendations  Rolling walker with 5" wheels    Recommendations for Other Services       Precautions / Restrictions Precautions Precautions:  Fall Precaution Comments: hx of seizures, extreme LOB to the R Restrictions Weight Bearing Restrictions: No      Mobility  Bed Mobility               General bed mobility comments: Pt in recliner when PT arrived.   Transfers Overall transfer level: Needs assistance Equipment used: None;Rolling walker (2 wheeled) Transfers: Sit to/from Stand Sit to Stand: Min assist;Mod assist         General transfer comment: Initially stood without RW for better assessment of balance.  Note increased posterior lean with cues for hand placement and safety.  Note improvement with use of RW for stability.   Ambulation/Gait Ambulation/Gait assistance: Max assist (+2 would be helpful for safety) Ambulation Distance (Feet): 50 Feet (without device, then another 75' with RW) Assistive device: Rolling walker (2 wheeled);None;1 person hand held assist (arm around therapist) Gait Pattern/deviations: Step-through pattern;Decreased stride length;Staggering right;Staggering left;Trunk flexed;Wide base of support     General Gait Details: Initially assessed gait without AD with use of gait belt.  Pt with very unsteady gait and need to hold onto bed to move around it.  therefore provided L HHA and assist with gait belt to doorway.  Pt with large LOB to the R x 2 reps requiring max A to correct with max verbal cues for safety.  Then assisted with pts L arm around therapist for increased support.  Then provided pt with RW to assess gait. Note marked improvement, however pt continued to lose balance to the R and would  intermittently tip RW with assist for support (min/mod) and assist for guiding and maintaining position of RW.   Stairs            Wheelchair Mobility    Modified Rankin (Stroke Patients Only)       Balance Overall balance assessment: Needs assistance Sitting-balance support: Feet supported;No upper extremity supported Sitting balance-Leahy Scale: Good   Postural control: Right  lateral lean;Posterior lean (in standing) Standing balance support: Single extremity supported;During functional activity Standing balance-Leahy Scale: Zero Standing balance comment: Pt with heavy LOB to the R several times during standing and gait.                              Pertinent Vitals/Pain Pain Assessment: No/denies pain    Home Living Family/patient expects to be discharged to:: Private residence Living Arrangements: Alone   Type of Home: House (townhome) Home Access: Stairs to enter Entrance Stairs-Rails: None Entrance Stairs-Number of Steps: 2 Home Layout: One level Home Equipment: None Additional Comments: sister lives 30 mins away and works during the day.     Prior Function Level of Independence: Independent               Hand Dominance        Extremity/Trunk Assessment               Lower Extremity Assessment: Generalized weakness (reports L sided weakness from old TBI)      Cervical / Trunk Assessment: Normal  Communication   Communication: No difficulties  Cognition Arousal/Alertness: Awake/alert Behavior During Therapy: WFL for tasks assessed/performed Overall Cognitive Status: Impaired/Different from baseline (per daughter, seems to have increased cognitive deficits) Area of Impairment: Memory;Safety/judgement;Awareness     Memory: Decreased recall of precautions   Safety/Judgement: Decreased awareness of safety;Decreased awareness of deficits Awareness: Emergent   General Comments: Per RN , sister states that she feels pt is not at baseline cognitively.     General Comments      Exercises        Assessment/Plan    PT Assessment Patient needs continued PT services  PT Diagnosis Difficulty walking;Generalized weakness;Abnormality of gait   PT Problem List Decreased strength;Decreased activity tolerance;Decreased balance;Decreased mobility;Decreased coordination;Decreased cognition;Decreased knowledge of use of  DME;Decreased safety awareness;Decreased knowledge of precautions  PT Treatment Interventions DME instruction;Gait training;Functional mobility training;Therapeutic activities;Therapeutic exercise;Balance training;Neuromuscular re-education;Cognitive remediation;Patient/family education   PT Goals (Current goals can be found in the Care Plan section) Acute Rehab PT Goals Patient Stated Goal: n/a, per sister to return home safely PT Goal Formulation: With patient/family Time For Goal Achievement: 12/05/14 Potential to Achieve Goals: Good    Frequency Min 3X/week   Barriers to discharge Decreased caregiver support sister lives 30 mins away and goes to school    Co-evaluation               End of Session Equipment Utilized During Treatment: Gait belt Activity Tolerance: Patient tolerated treatment well Patient left: in chair;with call bell/phone within reach;with chair alarm set Nurse Communication: Mobility status;Other (comment) (D/C plan)         Time: 4098-1191 PT Time Calculation (min) (ACUTE ONLY): 26 min   Charges:   PT Evaluation $Initial PT Evaluation Tier I: 1 Procedure PT Treatments $Gait Training: 23-37 mins   PT G Codes:          Denice Bors 11/28/2014, 2:59 PM

## 2014-11-28 NOTE — Progress Notes (Addendum)
Pt received at this time via bed from 3S alert and awake. Condom catheter in place. Assisted with sitting in his chair with chair alarm activiated. Safety measures in place. Pt educated and oriented to room. Call bell within reach. No noted distress. Will continue to monitor.

## 2014-11-28 NOTE — Progress Notes (Signed)
Utilization Review Completed.Patrick Torres T12/07/2014  

## 2014-11-28 NOTE — Plan of Care (Signed)
Problem: Phase I Progression Outcomes Goal: Pain controlled with appropriate interventions Outcome: Completed/Met Date Met:  11/28/14 Goal: OOB as tolerated unless otherwise ordered Outcome: Completed/Met Date Met:  11/28/14 Goal: Initial discharge plan identified Outcome: Completed/Met Date Met:  11/28/14 Goal: Voiding-avoid urinary catheter unless indicated Outcome: Completed/Met Date Met:  11/28/14 Goal: Hemodynamically stable Outcome: Completed/Met Date Met:  11/28/14  Problem: Phase II Progression Outcomes Goal: Progress activity as tolerated unless otherwise ordered Outcome: Completed/Met Date Met:  11/28/14 Goal: Vital signs remain stable Outcome: Completed/Met Date Met:  11/28/14 Goal: IV changed to normal saline lock Outcome: Completed/Met Date Met:  11/28/14 Goal: Obtain order to discontinue catheter if appropriate Outcome: Not Applicable Date Met:  90/30/09  Problem: Phase III Progression Outcomes Goal: Pain controlled on oral analgesia Outcome: Completed/Met Date Met:  11/28/14 Goal: Foley discontinued Outcome: Not Applicable Date Met:  23/30/07

## 2014-11-28 NOTE — Progress Notes (Signed)
[facility] PROGRESS NOTE  Patrick Torres IOX:[id number] DOB: April 12, 1961 DOA: 11/26/2014 PCP: Elizabeth Palau, MD  Assessment/Plan: #1 acute encephalopathy Clinical improvement. Unknown etiology. Likely secondary to seizure disorder as patient was noted to be in post ictal state on admission. Patient was also noted to be incontinent. Patient can't remember what happened. MRI of the head is negative for acute abnormalities. CT head negative. EEG this is an abnormal awake and asleep EEG with overall findings consistent with the interictal expression of a generalized epilepsy. No electrographic seizures noted. No signs or symptoms of infection. Patient received a dose of IV Keppra in the ED. Tegretol levels within normal limits. Patient's home regimen of tegretol and keppra have been resumed per neurology. Continue seizure precautions. Ativan as needed. PT/OT. Neurology following and appreciate input and recommendations.  #2 history of seizure disorder Patient with no seizures overnight. MRI of the head is negative for acute abnormalities. Continuous EEG this is an abnormal awake and asleep EEG with overall findings consistent with the interictal expression of a generalized epilepsy. No electrographic seizures noted.  Patient was given a dose of IV Keppra in the emergency room. Tegretol levels within normal limits. Patient's home regimen of tegretol and keppra have been resumed per neurology. Ativan as needed. Seizure precautions. PT/OT. Neurology following and appreciate her per the recommendations.  #3 history of traumatic brain injury Stable.  #4 Hypokalemia Replete.  #5 Mild dysphagia Patient has been seen by SLP. Continue dysphagia 3 diet.  #6 prophylaxis Lovenox for DVT prophylaxis.  Code Status: Full Family Communication: Updated patient, no family at bedside. Disposition Plan: Transfer to telemetry   Consultants:  Neurology: Dr. Janann Colonel 11/26/2014  Procedures:  CT  head 11/26/2014  Chest x-ray 11/26/2014  MRI head 11/27/14  EEG 12/6-12/7/15  Antibiotics:  None  HPI/Subjective: Patient with no complaints. Patient feeling better. No seizures noted overnight. Patient tolerating current diet.  Objective: Filed Vitals:   11/28/14 0347  BP: 134/80  Pulse: 65  Temp: 97.8 F (36.6 C)  Resp: 15    Intake/Output Summary (Last 24 hours) at 11/28/14 0835 Last data filed at 11/28/14 0700  Gross per 24 hour  Intake   1650 ml  Output    825 ml  Net    825 ml   Filed Weights   11/26/14 1535  Weight: 91.9 kg (202 lb 9.6 oz)    Exam:   General:  NAD.  Cardiovascular: RRR  Respiratory: CTAB anterior lung fields  Abdomen: Soft, nontender, nondistended, positive bowel sounds.  Musculoskeletal: No clubbing cyanosis or edema.  Data Reviewed: Basic Metabolic Panel:  Recent Labs Lab 11/26/14 1309 11/27/14 0925 11/28/14 0313  NA 146 145 142  K 3.9 4.1 3.4*  CL 109 110 108  CO2  --  21 21  GLUCOSE 101* 82 91  BUN 16 13 11   CREATININE 0.90 0.75 0.76  CALCIUM  --  8.9 8.4  MG  --  2.3  --    Liver Function Tests: No results for input(s): AST, ALT, ALKPHOS, BILITOT, PROT, ALBUMIN in the last 168 hours. No results for input(s): LIPASE, AMYLASE in the last 168 hours. No results for input(s): AMMONIA in the last 168 hours. CBC:  Recent Labs Lab 11/26/14 1255 11/26/14 1309 11/27/14 0925 11/28/14 0313  WBC 9.8  --  6.7 6.8  NEUTROABS 7.8*  --   --   --   HGB 15.4 16.0 14.5 13.9  HCT 44.5 47.0 41.8 41.5  MCV 90.6  --  89.5 89.6  PLT 184  --  173 137*   Cardiac Enzymes:  Recent Labs Lab 11/26/14 1255  CKTOTAL 945*   BNP (last 3 results) No results for input(s): PROBNP in the last 8760 hours. CBG:  Recent Labs Lab 11/26/14 1052  GLUCAP 100*    Recent Results (from the past 240 hour(s))  MRSA PCR Screening     Status: None   Collection Time: 11/26/14  4:32 PM  Result Value Ref Range Status   MRSA by PCR  NEGATIVE NEGATIVE Final    Comment:        The GeneXpert MRSA Assay (FDA approved for NASAL specimens only), is one component of a comprehensive MRSA colonization surveillance program. It is not intended to diagnose MRSA infection nor to guide or monitor treatment for MRSA infections.      Studies: Ct Head Wo Contrast  11/26/2014   CLINICAL DATA:  Acute altered mental status, unresponsive  EXAM: CT HEAD WITHOUT CONTRAST  TECHNIQUE: Contiguous axial images were obtained from the base of the skull through the vertex without contrast.  COMPARISON:  None  FINDINGS: Study is limited with motion artifact on several images.  Mild diffuse brain atrophy without acute intracranial hemorrhage, mass lesion, or gross infarction. No definite focal mass effect or edema. No midline shift, hydrocephalus, or extra-axial fluid collection. Cisterns are patent. Cerebellar atrophy as well. Orbits are symmetric. Mastoids are clear. Bilateral mucosal thickening of the ethmoid and maxillary sinuses with air-fluid levels, compatible with sinusitis.  IMPRESSION: Limited exam with motion artifact. No gross acute intracranial abnormality. Diffuse brain atrophy.  Ethmoid and maxillary sinusitis.   Electronically Signed   By: Daryll Brod M.D.   On: 11/26/2014 11:34   Mr Brain Wo Contrast  11/27/2014   CLINICAL DATA:  53 year old male found down at home, unconscious. Current history of traumatic brain injury and seizures. Initial encounter.  EXAM: MRI HEAD WITHOUT CONTRAST  TECHNIQUE: Multiplanar, multiecho pulse sequences of the brain and surrounding structures were obtained without intravenous contrast.  COMPARISON:  Head CT without contrast 11/26/2014.  FINDINGS: Small bilateral CSF intensity extra-axial collections (series 10, image 10) seem increased compared to the recent CT. No other extra-axial collection. Dural calcifications. There may be chronic hemorrhage in the left cerebellum at the sub up enema of the fourth  ventricle (series 7, image 38), but no acute intracranial hemorrhage is identified.  Subtle increased diffusion signal associated with the right caudate on series 4, image 23, but without associated T2 or FLAIR hyperintensity, and no definite restricted diffusion on ADC map. No restricted diffusion or evidence of acute infarction. Major intracranial vascular flow voids are preserved, dominant appearing distal right vertebral artery. No ventriculomegaly. No midline shift or significant intracranial mass effect. Negative pituitary, cervicomedullary junction and visualized cervical spine. Negative bone marrow signal, evidence of calvarium hyperostosis.  There is cerebellar volume loss. Minimal nonspecific cerebral white matter T2 and FLAIR hyperintensity. There is mild asymmetric increased T2 hyperintensity in the left hippocampal complex (Series 10, image 11). It hippocampal volume appears symmetric. Other mesial temporal lobe structures are within normal limits.  Visible internal auditory structures appear normal. Mastoids are clear. Continued paranasal sinus mucosal thickening and fluid. Grossly normal orbits soft tissues, degraded by motion artifact. Visualized scalp soft tissues are within normal limits.  IMPRESSION: 1. Small bilateral CSF hygromas. These are nonspecific and can be the sequelae of trauma. These seem mildly increased from the recent CT, but acuity is uncertain. No significant intracranial mass effect.  2. No definite acute infarct. Mildly increased T2 signal in the left hippocampal complex, might relate to recent seizure activity. 3. Age advanced cerebellar volume loss is nonspecific and along with hyperostosis of the calvarium can be seen as the sequelae of chronic anti seizure therapy.   Electronically Signed   By: Lars Pinks M.D.   On: 11/27/2014 18:50   Dg Chest Port 1 View  11/26/2014   CLINICAL DATA:  Acute altered mental status  EXAM: PORTABLE CHEST - 1 VIEW  COMPARISON:  None.  FINDINGS:  Low volume exam accentuating the heart size. Basilar atelectasis noted. No definite pneumonia or edema. Negative for effusion or pneumothorax. Trachea midline. No acute osseous finding.  IMPRESSION: Low volume exam without acute process   Electronically Signed   By: Daryll Brod M.D.   On: 11/26/2014 11:24    Scheduled Meds: . carbamazepine  400 mg Oral BID  . enoxaparin (LOVENOX) injection  40 mg Subcutaneous Q24H  . levETIRAcetam  500 mg Oral BID  . potassium chloride  40 mEq Oral Q4H   Continuous Infusions: . sodium chloride 75 mL/hr (11/26/14 1630)    Principal Problem:   Acute encephalopathy Active Problems:   Altered mental state   Seizure   TBI (traumatic brain injury)   Seizure disorder   Dysphagia: mild   Hypokalemia    Time spent: 79 minutes    Bertel Venard M.D. [facility] Pager [phone]. If 7PM-7AM, please contact night-coverage at [email or web address], password [facility] 11/28/2014, 8:35 AM  LOS: 2 days

## 2014-11-29 DIAGNOSIS — R410 Disorientation, unspecified: Secondary | ICD-10-CM

## 2014-11-29 LAB — CBC
HCT: 40.4 % (ref 39.0–52.0)
HEMOGLOBIN: 13.6 g/dL (ref 13.0–17.0)
MCH: 29.9 pg (ref 26.0–34.0)
MCHC: 33.7 g/dL (ref 30.0–36.0)
MCV: 88.8 fL (ref 78.0–100.0)
Platelets: 156 10*3/uL (ref 150–400)
RBC: 4.55 MIL/uL (ref 4.22–5.81)
RDW: 13 % (ref 11.5–15.5)
WBC: 6.7 10*3/uL (ref 4.0–10.5)

## 2014-11-29 LAB — LEVETIRACETAM LEVEL: Levetiracetam Lvl: <1 ug/mL

## 2014-11-29 LAB — BASIC METABOLIC PANEL
Anion gap: 11 (ref 5–15)
BUN: 7 mg/dL (ref 6–23)
CHLORIDE: 108 meq/L (ref 96–112)
CO2: 22 mEq/L (ref 19–32)
Calcium: 8.8 mg/dL (ref 8.4–10.5)
Creatinine, Ser: 0.81 mg/dL (ref 0.50–1.35)
GFR calc Af Amer: >90 mL/min (ref >90)
GLUCOSE: 101 mg/dL — AB (ref 70–99)
POTASSIUM: 3.8 meq/L (ref 3.7–5.3)
Sodium: 141 mEq/L (ref 137–147)

## 2014-11-29 NOTE — Plan of Care (Signed)
Problem: Phase II Progression Outcomes Goal: Discharge plan established Outcome: Completed/Met Date Met:  11/29/14     

## 2014-11-29 NOTE — Plan of Care (Signed)
Problem: Phase I Progression Outcomes Goal: Other Phase I Outcomes/Goals Outcome: Completed/Met Date Met:  11/29/14

## 2014-11-29 NOTE — Progress Notes (Signed)
[facility] PROGRESS NOTE  Patrick Torres BZJ:[id number] DOB: [birth year] DOA: 11/26/2014 PCP: Elizabeth Palau, MD  Assessment/Plan: 1. Acute encephalopathy 1. Much improved. Likely secondary to post-ictal state given presenting seizures 2. MRI and head CT neg 3. Neurology following 4. EEG with findings consistent with interictal expression of generalized epilepsy 2. Seizures 1. Keppra dose increased to 1gm bid per neurology recs 2. On therapeutic tegretol  3. Hx TBI 1. Seems stable 4. Hypokalemia 1. Replaced 2. Cont to replace as needed 5. Dysphagia 1. On dysphagia 3 diet with thin liquids 6. DVT prophylaxis 1. Lovenox subQ  Code Status: Full Family Communication: Pt in room (indicate person spoken with, relationship, and if by phone, the number) Disposition Plan: SNF placement pending   Consultants:  Neurology  Procedures:  EEG  Antibiotics:  HPI/Subjective: No acute events noted overnight. Pt is eager to go home  Objective: Filed Vitals:   11/29/14 0224 11/29/14 0635 11/29/14 1031 11/29/14 1337  BP: 136/76 124/64 128/77 123/79  Pulse: 62 56 70 77  Temp: 98.8 F (37.1 C) 97.6 F (36.4 C) 98.4 F (36.9 C) 98.5 F (36.9 C)  TempSrc: Oral Oral Oral Oral  Resp: 18 18 18 17   Height:      Weight:      SpO2: 98% 98% 100% 97%    Intake/Output Summary (Last 24 hours) at 11/29/14 1530 Last data filed at 11/29/14 0175  Gross per 24 hour  Intake      0 ml  Output    400 ml  Net   -400 ml   Filed Weights   11/26/14 1535  Weight: 91.9 kg (202 lb 9.6 oz)    Exam:   General:  Awake, in nad  Cardiovascular: regular, s1, s2  Respiratory: normal resp effort, no wheezing  Abdomen: soft, nondistended  Musculoskeletal: perfused, no clubbing   Data Reviewed: Basic Metabolic Panel:  Recent Labs Lab 11/26/14 1309 11/27/14 0925 11/28/14 0313 11/29/14 0406  NA 146 145 142 141  K 3.9 4.1 3.4* 3.8  CL 109 110 108 108  CO2  --  21 21 22    GLUCOSE 101* 82 91 101*  BUN 16 13 11 7   CREATININE 0.90 0.75 0.76 0.81  CALCIUM  --  8.9 8.4 8.8  MG  --  2.3 2.0  --    Liver Function Tests: No results for input(s): AST, ALT, ALKPHOS, BILITOT, PROT, ALBUMIN in the last 168 hours. No results for input(s): LIPASE, AMYLASE in the last 168 hours. No results for input(s): AMMONIA in the last 168 hours. CBC:  Recent Labs Lab 11/26/14 1255 11/26/14 1309 11/27/14 0925 11/28/14 0313 11/29/14 0406  WBC 9.8  --  6.7 6.8 6.7  NEUTROABS 7.8*  --   --   --   --   HGB 15.4 16.0 14.5 13.9 13.6  HCT 44.5 47.0 41.8 41.5 40.4  MCV 90.6  --  89.5 89.6 88.8  PLT 184  --  173 137* 156   Cardiac Enzymes:  Recent Labs Lab 11/26/14 1255  CKTOTAL 945*   BNP (last 3 results) No results for input(s): PROBNP in the last 8760 hours. CBG:  Recent Labs Lab 11/26/14 1052  GLUCAP 100*    Recent Results (from the past 240 hour(s))  MRSA PCR Screening     Status: None   Collection Time: 11/26/14  4:32 PM  Result Value Ref Range Status   MRSA by PCR NEGATIVE NEGATIVE Final    Comment:  The GeneXpert MRSA Assay (FDA approved for NASAL specimens only), is one component of a comprehensive MRSA colonization surveillance program. It is not intended to diagnose MRSA infection nor to guide or monitor treatment for MRSA infections.      Studies: Mr Herby Abraham Contrast  11/27/2014   CLINICAL DATA:  53 year old male found down at home, unconscious. Current history of traumatic brain injury and seizures. Initial encounter.  EXAM: MRI HEAD WITHOUT CONTRAST  TECHNIQUE: Multiplanar, multiecho pulse sequences of the brain and surrounding structures were obtained without intravenous contrast.  COMPARISON:  Head CT without contrast 11/26/2014.  FINDINGS: Small bilateral CSF intensity extra-axial collections (series 10, image 10) seem increased compared to the recent CT. No other extra-axial collection. Dural calcifications. There may be chronic  hemorrhage in the left cerebellum at the sub up enema of the fourth ventricle (series 7, image 38), but no acute intracranial hemorrhage is identified.  Subtle increased diffusion signal associated with the right caudate on series 4, image 23, but without associated T2 or FLAIR hyperintensity, and no definite restricted diffusion on ADC map. No restricted diffusion or evidence of acute infarction. Major intracranial vascular flow voids are preserved, dominant appearing distal right vertebral artery. No ventriculomegaly. No midline shift or significant intracranial mass effect. Negative pituitary, cervicomedullary junction and visualized cervical spine. Negative bone marrow signal, evidence of calvarium hyperostosis.  There is cerebellar volume loss. Minimal nonspecific cerebral white matter T2 and FLAIR hyperintensity. There is mild asymmetric increased T2 hyperintensity in the left hippocampal complex (Series 10, image 11). It hippocampal volume appears symmetric. Other mesial temporal lobe structures are within normal limits.  Visible internal auditory structures appear normal. Mastoids are clear. Continued paranasal sinus mucosal thickening and fluid. Grossly normal orbits soft tissues, degraded by motion artifact. Visualized scalp soft tissues are within normal limits.  IMPRESSION: 1. Small bilateral CSF hygromas. These are nonspecific and can be the sequelae of trauma. These seem mildly increased from the recent CT, but acuity is uncertain. No significant intracranial mass effect. 2. No definite acute infarct. Mildly increased T2 signal in the left hippocampal complex, might relate to recent seizure activity. 3. Age advanced cerebellar volume loss is nonspecific and along with hyperostosis of the calvarium can be seen as the sequelae of chronic anti seizure therapy.   Electronically Signed   By: Lars Pinks M.D.   On: 11/27/2014 18:50    Scheduled Meds: . carbamazepine  400 mg Oral BID  . enoxaparin  (LOVENOX) injection  40 mg Subcutaneous Q24H  . levETIRAcetam  1,000 mg Oral BID   Continuous Infusions:   Principal Problem:   Acute encephalopathy Active Problems:   Altered mental state   Seizure   TBI (traumatic brain injury)   Seizure disorder   Dysphagia: mild   Hypokalemia  Time spent: 88min  CH[facility], [facility] Pager [phone]. If 7PM-7AM, please contact night-coverage at [email or web address], password [facility] 11/29/2014, 3:30 PM  LOS: 3 days

## 2014-11-29 NOTE — Plan of Care (Signed)
Problem: Consults Goal: Nutrition Consult-if indicated Outcome: Not Applicable Date Met:  11/29/14     

## 2014-11-29 NOTE — Clinical Social Work Placement (Addendum)
Clinical Social Work Department CLINICAL SOCIAL WORK PLACEMENT NOTE 11/29/2014  Patient:  Patrick Torres, Patrick Torres  Account Number:  [id number] Admit date:  11/26/2014  Clinical Social Worker:  Greta Doom, LCSWA  Date/time:  11/29/2014 10:57 AM  Clinical Social Work is seeking post-discharge placement for this patient at the following level of care:   SKILLED NURSING   (*CSW will update this form in Epic as items are completed)   11/29/2014  Patient/family provided with [place] Department of Clinical Social Work's list of facilities offering this level of care within the geographic area requested by the patient (or if unable, by the patient's family).  11/29/2014  Patient/family informed of their freedom to choose among providers that offer the needed level of care, that participate in Medicare, Medicaid or managed care program needed by the patient, have an available bed and are willing to accept the patient.  11/29/2014  Patient/family informed of [facility]' ownership interest in [facility], as well as of the fact that they are under no obligation to receive care at this facility.  PASARR submitted to EDS on 11/29/2014 PASARR number received on 11/29/2014  FL2 transmitted to all facilities in geographic area requested by pt/family on  11/29/2014 FL2 transmitted to all facilities within larger geographic area on 11/29/2014  Patient informed that his/her managed care company has contracts with or will negotiate with  certain facilities, including the following:     Patient/family informed of bed offers received: 11/29/2014  Patient chooses bed at [facility]  Physician recommends and patient chooses bed at    Patient to be transferred to [place] on 12/01/2014   Patient to be transferred to facility by [facility]   Patient and family notified of transfer on 12/01/2014 Name of family member notified:  Pt and pt's sister Patrick Torres   The following physician request were entered  in Epic:   Additional Comments:  [place], MSW, [place]

## 2014-11-29 NOTE — Clinical Social Work Note (Signed)
CSW met the pt and the pt's sister Torita at bedside. CSW introduced self and purpose of the visit. CSW discussed the clinical team's recommendations for rehab. CSW provided a SNF list to the pt and Torita.  CSW explained the SNF process.  CSW discussed the insurance coverage as it reflects SNF placement. CSW explained the importance of researching and touring the SNFs. CSW answered all questions in which Torita inquired about. CSW provided the pt and Torita with contact information for further questions. CSW will continue to follow this pt and assist with discharge as needed.  Vance Chapel, MSW, [place]

## 2014-11-30 NOTE — Progress Notes (Signed)
[facility] PROGRESS NOTE  Patrick Torres QPY:[id number] DOB: September 30, 1961 DOA: 11/26/2014 PCP: Elizabeth Palau, MD  Assessment/Plan: 1. Acute encephalopathy 1. Much improved. Likely secondary to post-ictal state given presenting seizures 2. MRI and head CT neg 3. Neurology following 4. EEG with findings consistent with interictal expression of generalized epilepsy 5. Pt is medically stable and ready for discharge to SNF at this time. Neurology has signed off. 2. Seizures 1. Keppra dose increased to 1gm bid per neurology recs 2. On therapeutic tegretol  3. Hx TBI 1. Seems stable 4. Hypokalemia 1. Replaced 2. Cont to replace as needed 5. Dysphagia 1. On dysphagia 3 diet with thin liquids 6. DVT prophylaxis 1. Lovenox subQ  Code Status: Full Family Communication: Pt in room, attempted to call pt's sister at number listed for update, but no answer Disposition Plan: SNF placement tomorrow pending   Consultants:  Neurology  Procedures:  EEG  Antibiotics:  HPI/Subjective: Pt feels better. No acute events noted overnight.  Objective: Filed Vitals:   11/30/14 0133 11/30/14 0531 11/30/14 0925 11/30/14 1413  BP: 134/74 141/75 140/82 123/75  Pulse: 57 65 81 69  Temp: 98 F (36.7 C) 97.8 F (36.6 C) 98.2 F (36.8 C) 98.4 F (36.9 C)  TempSrc: Oral Oral Oral Oral  Resp: 20 20 18 20   Height:      Weight:      SpO2: 100% 100% 99% 99%    Intake/Output Summary (Last 24 hours) at 11/30/14 1508 Last data filed at 11/30/14 1245  Gross per 24 hour  Intake      0 ml  Output   1081 ml  Net  -1081 ml   Filed Weights   11/26/14 1535  Weight: 91.9 kg (202 lb 9.6 oz)    Exam:   General:  Awake, in nad  Cardiovascular: regular, s1, s2  Respiratory: normal resp effort, no wheezing  Abdomen: soft, nondistended  Musculoskeletal: perfused, no clubbing   Data Reviewed: Basic Metabolic Panel:  Recent Labs Lab 11/26/14 1309 11/27/14 0925 11/28/14 0313  11/29/14 0406  NA 146 145 142 141  K 3.9 4.1 3.4* 3.8  CL 109 110 108 108  CO2  --  21 21 22   GLUCOSE 101* 82 91 101*  BUN 16 13 11 7   CREATININE 0.90 0.75 0.76 0.81  CALCIUM  --  8.9 8.4 8.8  MG  --  2.3 2.0  --    Liver Function Tests: No results for input(s): AST, ALT, ALKPHOS, BILITOT, PROT, ALBUMIN in the last 168 hours. No results for input(s): LIPASE, AMYLASE in the last 168 hours. No results for input(s): AMMONIA in the last 168 hours. CBC:  Recent Labs Lab 11/26/14 1255 11/26/14 1309 11/27/14 0925 11/28/14 0313 11/29/14 0406  WBC 9.8  --  6.7 6.8 6.7  NEUTROABS 7.8*  --   --   --   --   HGB 15.4 16.0 14.5 13.9 13.6  HCT 44.5 47.0 41.8 41.5 40.4  MCV 90.6  --  89.5 89.6 88.8  PLT 184  --  173 137* 156   Cardiac Enzymes:  Recent Labs Lab 11/26/14 1255  CKTOTAL 945*   BNP (last 3 results) No results for input(s): PROBNP in the last 8760 hours. CBG:  Recent Labs Lab 11/26/14 1052  GLUCAP 100*    Recent Results (from the past 240 hour(s))  MRSA PCR Screening     Status: None   Collection Time: 11/26/14  4:32 PM  Result Value Ref Range Status  MRSA by PCR NEGATIVE NEGATIVE Final    Comment:        The GeneXpert MRSA Assay (FDA approved for NASAL specimens only), is one component of a comprehensive MRSA colonization surveillance program. It is not intended to diagnose MRSA infection nor to guide or monitor treatment for MRSA infections.      Studies: No results found.  Scheduled Meds: . carbamazepine  400 mg Oral BID  . enoxaparin (LOVENOX) injection  40 mg Subcutaneous Q24H  . levETIRAcetam  1,000 mg Oral BID   Continuous Infusions:   Principal Problem:   Acute encephalopathy Active Problems:   Altered mental state   Seizure   TBI (traumatic brain injury)   Seizure disorder   Dysphagia: mild   Hypokalemia  Time spent: 108min  CHIU, [place] [facility] Pager [phone]. If 7PM-7AM, please contact night-coverage at  [email or web address], password [facility] 11/30/2014, 3:08 PM  LOS: 4 days

## 2014-11-30 NOTE — Clinical Social Work Note (Addendum)
CSW left a voice message for Torita regarding the family final decision for SNF placement. CSW awaiting a call back. CSW will continue to follow and assist with discharge.   Addendum:  CSW received a call from the pt's elder sister Kire Ferg who lives in [place]. Thomasa reported that she has spoken with [facility] and [facility]. Thomasa reported that she hope Katheran Awe can tour these places today. Thomasa reported Katheran Awe has final exams for the remainder of the week. CSW explained the importance of selecting a SNF placement and its relation to the pt being ready to discharge. Thomasa reported she will call Torita.   [place], MSW, [place]

## 2014-11-30 NOTE — Progress Notes (Signed)
Subjective: No further seizure events over the last 48 hours.  Tolerating Keppra well at 1000 mg BID.   Objective: Current vital signs: BP 141/75 mmHg  Pulse 65  Temp(Src) 97.8 F (36.6 C) (Oral)  Resp 20  Ht 5\' 8"  (1.727 m)  Wt 91.9 kg (202 lb 9.6 oz)  BMI 30.81 kg/m2  SpO2 100% Vital signs in last 24 hours: Temp:  [97.7 F (36.5 C)-98.8 F (37.1 C)] 97.8 F (36.6 C) (12/10 0531) Pulse Rate:  [57-77] 65 (12/10 0531) Resp:  [17-20] 20 (12/10 0531) BP: (123-141)/(74-88) 141/75 mmHg (12/10 0531) SpO2:  [97 %-100 %] 100 % (12/10 0531)  Intake/Output from previous day: 12/09 0701 - 12/10 0700 In: -  Out: 901 [Urine:900; Stool:1] Intake/Output this shift: Total I/O In: -  Out: 180 [Urine:180] Nutritional status: DIET DYS 3  Neurologic Exam: Mental Status: Alert, oriented, thought content appropriate. Speech slow and with no dysarthria and without evidence of aphasia. Able to follow 3 step commands without difficulty. Sitting up and eating breakfast without difficulty Cranial Nerves: II: Visual fields grossly normal, pupils equal, round, reactive to light and accommodation III,IV, VI: ptosis not present, extra-ocular motions intact bilaterally V,VII: smile symmetric, facial light touch sensation normal bilaterally VIII: hearing normal bilaterally IX,X: gag reflex present XI: bilateral shoulder shrug XII: midline tongue extension without atrophy or fasciculations  Motor: Right :Upper extremity 5/5Left: Upper extremity 5/5 Lower extremity 5/5Lower extremity 5/5 Tone and bulk:normal tone throughout; no atrophy noted Sensory: Pinprick and light touch intact throughout, bilaterally Deep Tendon Reflexes:  Right: Upper Extremity Left: Upper extremity   biceps (C-5 to C-6) 2/4 biceps (C-5 to C-6) 2/4 tricep (C7)  2/4triceps (C7) 2/4 Brachioradialis (C6) 2/4Brachioradialis (C6) 2/4  Lower Extremity Lower Extremity  quadriceps (L-2 to L-4) 2/4 quadriceps (L-2 to L-4) 2/4 Achilles (S1) 2/4Achilles (S1) 2/4   Lab Results: Basic Metabolic Panel:  Recent Labs Lab 11/26/14 1309 11/27/14 0925 11/28/14 0313 11/29/14 0406  NA 146 145 142 141  K 3.9 4.1 3.4* 3.8  CL 109 110 108 108  CO2  --  21 21 22   GLUCOSE 101* 82 91 101*  BUN 16 13 11 7   CREATININE 0.90 0.75 0.76 0.81  CALCIUM  --  8.9 8.4 8.8  MG  --  2.3 2.0  --     Liver Function Tests: No results for input(s): AST, ALT, ALKPHOS, BILITOT, PROT, ALBUMIN in the last 168 hours. No results for input(s): LIPASE, AMYLASE in the last 168 hours. No results for input(s): AMMONIA in the last 168 hours.  CBC:  Recent Labs Lab 11/26/14 1255 11/26/14 1309 11/27/14 0925 11/28/14 0313 11/29/14 0406  WBC 9.8  --  6.7 6.8 6.7  NEUTROABS 7.8*  --   --   --   --   HGB 15.4 16.0 14.5 13.9 13.6  HCT 44.5 47.0 41.8 41.5 40.4  MCV 90.6  --  89.5 89.6 88.8  PLT 184  --  173 137* 156    Cardiac Enzymes:  Recent Labs Lab 11/26/14 1255  CKTOTAL 945*    Lipid Panel: No results for input(s): CHOL, TRIG, HDL, CHOLHDL, VLDL, LDLCALC in the last 168 hours.  CBG:  Recent Labs Lab 11/26/14 1052  GLUCAP 100*    Microbiology: Results for orders placed or performed during the hospital encounter of 11/26/14  MRSA PCR Screening     Status: None   Collection Time: 11/26/14  4:32 PM  Result Value Ref Range Status   MRSA by  PCR NEGATIVE NEGATIVE Final    Comment:        The GeneXpert MRSA Assay (FDA approved for NASAL specimens only), is one component of a comprehensive MRSA colonization surveillance program. It is not intended to diagnose MRSA infection nor to guide or monitor treatment for MRSA infections.     Coagulation  Studies: No results for input(s): LABPROT, INR in the last 72 hours.  Imaging: No results found.  Medications:  Scheduled: . carbamazepine  400 mg Oral BID  . enoxaparin (LOVENOX) injection  40 mg Subcutaneous Q24H  . levETIRAcetam  1,000 mg Oral BID    Assessment/Plan: Seizure. Tolerating increased dose of Keppra well.  Now on 1000 mg BID PO.  No further seizures over the last 48 hours and more alert. At this time would continue Keppra at current dose and have follow up as out patient with neurology.   Neurology S/O   Etta Quill PA-C [facility] [phone]  11/30/2014, 8:58 AM

## 2014-11-30 NOTE — Progress Notes (Signed)
Physical Therapy Treatment Patient Details Name: Patrick Torres MRN: [id number] DOB: [birth year] Today's Date: 11/30/2014    History of Present Illness 53 yo male admitted due to being found down s/p seizure and AMS. Pt lives at home alone. Church members located patietn after missing church event.  CT negative, chest xray shows infectionPMH: TBI, seizure disorder on keppra and tegretol,     PT Comments    Pt much improved however balance and gait deficits remain. Requiring min assist to maintain/regain balance with dynamic standing activities and walking. Pt with improved cognition and very receptive to therapy interventions.   Follow Up Recommendations  SNF;Supervision/Assistance - 24 hour     Equipment Recommendations  Rolling walker with 5" wheels    Recommendations for Other Services       Precautions / Restrictions Precautions Precautions: Fall    Mobility  Bed Mobility                  Transfers Overall transfer level: Needs assistance Equipment used: Rolling walker (2 wheeled) Transfers: Sit to/from Stand Sit to Stand: Min guard         General transfer comment: vc for safe use of RW; torso ataxic/off-balance  Ambulation/Gait Ambulation/Gait assistance: Min assist Ambulation Distance (Feet): 300 Feet Assistive device: Rolling walker (2 wheeled);None;1 person hand held assist Gait Pattern/deviations: Step-through pattern;Decreased stride length;Drifts right/left;Wide base of support Gait velocity: 0.91 ft/sec Gait velocity interpretation: <1.8 ft/sec, indicative of risk for recurrent falls General Gait Details: very little ability to incr his speed when cues given; feels he is safer with very slow gait; remains unsteady with ataxia in trunk noted   Stairs            Wheelchair Mobility    Modified Rankin (Stroke Patients Only)       Balance Overall balance assessment: Needs assistance         Standing balance support: No upper  extremity supported Standing balance-Leahy Scale: Poor Standing balance comment: +anterior-posterior and Lt sway with wide BOs Single Leg Stance - Right Leg: 2 Single Leg Stance - Left Leg: 1 Tandem Stance - Right Leg: 5   Rhomberg - Eyes Opened: 15 Rhomberg - Eyes Closed: 9        Cognition Arousal/Alertness: Awake/alert Behavior During Therapy: Flat affect Overall Cognitive Status: Impaired/Different from baseline Area of Impairment: Safety/judgement         Safety/Judgement: Decreased awareness of safety (improved awareness of deficits) Awareness: Emergent        Exercises Other Exercises Other Exercises: standing heel raises with controlled return to upright x 10 wit bil UE support Other Exercises: marching with bil UE support    General Comments        Pertinent Vitals/Pain Pain Assessment: No/denies pain    Home Living                      Prior Function            PT Goals (current goals can now be found in the care plan section) Acute Rehab PT Goals Patient Stated Goal: wants to improve his balance Progress towards PT goals: Progressing toward goals    Frequency  Min 3X/week    PT Plan Current plan remains appropriate    Co-evaluation             End of Session Equipment Utilized During Treatment: Gait belt Activity Tolerance: Patient tolerated treatment well Patient left: in chair;with call bell/phone within reach;with  chair alarm set     Time: 1425-1450 PT Time Calculation (min) (ACUTE ONLY): 25 min  Charges:  $Gait Training: 23-37 mins                    G Codes:      Patrick Torres 12-07-2014, 3:01 PM Pager [phone]

## 2014-12-01 MED ORDER — LEVETIRACETAM 1000 MG PO TABS: 1000.0000 mg | ORAL_TABLET | Freq: Two times a day (BID) | ORAL | Status: DC

## 2014-12-01 NOTE — Progress Notes (Signed)
Patient's family has picked [facility] for patient's placement.

## 2014-12-01 NOTE — Discharge Summary (Signed)
Physician Discharge Summary  Patrick Torres RWE:[id number] DOB: [birth year] DOA: 11/26/2014  PCP: Elizabeth Palau, MD  Admit date: 11/26/2014 Discharge date: 12/01/2014  Time spent: 30 minutes  Recommendations for Outpatient Follow-up:  1. Follow up with PCP in 1-2 weeks  Discharge Diagnoses:  Principal Problem:   Acute encephalopathy Active Problems:   Altered mental state   Seizure   TBI (traumatic brain injury)   Seizure disorder   Dysphagia: mild   Hypokalemia  Discharge Condition: Improved  Diet recommendation: Dysphagia 3, thin liquids  Filed Weights   11/26/14 1535  Weight: 91.9 kg (202 lb 9.6 oz)    History of present illness:  Please see admit h and p for details. Briefly, presents with recurrent seizures and altered mental status. The patient was admitted for further work up.  Hospital Course: 1. Acute encephalopathy 1. Much improved. Likely secondary to post-ictal state given presenting seizures 2. MRI and head CT neg 3. Neurology was consulted and had been following 4. EEG with findings consistent with interictal expression of generalized epilepsy 5. Pt is much improved, medically stable and ready for discharge to SNF at this time. Neurology has signed off. 2. Seizures 1. Keppra dose increased to 1gm bid per neurology recs 2. On therapeutic tegretol  3. Hx TBI 1. Seems stable 4. Hypokalemia 1. Replaced 2. Cont to replace as needed 5. Dysphagia 1. On dysphagia 3 diet with thin liquids 6. DVT prophylaxis 1. Lovenox subQ  Consultations:  Neurology  Discharge Exam: Filed Vitals:   11/30/14 2115 12/01/14 0154 12/01/14 0611 12/01/14 0930  BP: 144/82 138/83 128/73 127/76  Pulse: 70 66 64 74  Temp: 98.4 F (36.9 C) 98.1 F (36.7 C) 98.1 F (36.7 C) 98.4 F (36.9 C)  TempSrc: Oral Oral Oral Oral  Resp: 18 18 18 17   Height:      Weight:      SpO2: 99% 100% 99% 99%   General: Awake, in nad Cardiovascular: regular, s1, s2 Respiratory:  normal resp effort, no wheezing  Discharge Instructions     Medication List    TAKE these medications        carbamazepine 400 MG 12 hr tablet  Commonly known as:  TEGRETOL XR  Take 1 tablet (400 mg total) by mouth 2 (two) times daily.     levETIRAcetam 1000 MG tablet  Commonly known as:  KEPPRA  Take 1 tablet (1,000 mg total) by mouth 2 (two) times daily.     naproxen 500 MG tablet  Commonly known as:  NAPROSYN  Take 1 tablet (500 mg total) by mouth 2 (two) times daily.     naproxen sodium 220 MG tablet  Commonly known as:  ANAPROX  Take 440 mg by mouth every 6 (six) hours as needed (pain).       No Known Allergies Follow-up Information    Follow up with Elizabeth Palau, MD. Schedule an appointment as soon as possible for a visit in 1 week.   Specialty:  Family Medicine   Contact information:   [place] [address] [phone]        The results of significant diagnostics from this hospitalization (including imaging, microbiology, ancillary and laboratory) are listed below for reference.    Significant Diagnostic Studies: Ct Head Wo Contrast  11/26/2014   CLINICAL DATA:  Acute altered mental status, unresponsive  EXAM: CT HEAD WITHOUT CONTRAST  TECHNIQUE: Contiguous axial images were obtained from the base of the skull through the  vertex without contrast.  COMPARISON:  None  FINDINGS: Study is limited with motion artifact on several images.  Mild diffuse brain atrophy without acute intracranial hemorrhage, mass lesion, or gross infarction. No definite focal mass effect or edema. No midline shift, hydrocephalus, or extra-axial fluid collection. Cisterns are patent. Cerebellar atrophy as well. Orbits are symmetric. Mastoids are clear. Bilateral mucosal thickening of the ethmoid and maxillary sinuses with air-fluid levels, compatible with sinusitis.  IMPRESSION: Limited exam with motion artifact. No gross acute intracranial abnormality.  Diffuse brain atrophy.  Ethmoid and maxillary sinusitis.   Electronically Signed   By: Daryll Brod M.D.   On: 11/26/2014 11:34   Mr Brain Wo Contrast  11/27/2014   CLINICAL DATA:  53 year old male found down at home, unconscious. Current history of traumatic brain injury and seizures. Initial encounter.  EXAM: MRI HEAD WITHOUT CONTRAST  TECHNIQUE: Multiplanar, multiecho pulse sequences of the brain and surrounding structures were obtained without intravenous contrast.  COMPARISON:  Head CT without contrast 11/26/2014.  FINDINGS: Small bilateral CSF intensity extra-axial collections (series 10, image 10) seem increased compared to the recent CT. No other extra-axial collection. Dural calcifications. There may be chronic hemorrhage in the left cerebellum at the sub up enema of the fourth ventricle (series 7, image 38), but no acute intracranial hemorrhage is identified.  Subtle increased diffusion signal associated with the right caudate on series 4, image 23, but without associated T2 or FLAIR hyperintensity, and no definite restricted diffusion on ADC map. No restricted diffusion or evidence of acute infarction. Major intracranial vascular flow voids are preserved, dominant appearing distal right vertebral artery. No ventriculomegaly. No midline shift or significant intracranial mass effect. Negative pituitary, cervicomedullary junction and visualized cervical spine. Negative bone marrow signal, evidence of calvarium hyperostosis.  There is cerebellar volume loss. Minimal nonspecific cerebral white matter T2 and FLAIR hyperintensity. There is mild asymmetric increased T2 hyperintensity in the left hippocampal complex (Series 10, image 11). It hippocampal volume appears symmetric. Other mesial temporal lobe structures are within normal limits.  Visible internal auditory structures appear normal. Mastoids are clear. Continued paranasal sinus mucosal thickening and fluid. Grossly normal orbits soft tissues,  degraded by motion artifact. Visualized scalp soft tissues are within normal limits.  IMPRESSION: 1. Small bilateral CSF hygromas. These are nonspecific and can be the sequelae of trauma. These seem mildly increased from the recent CT, but acuity is uncertain. No significant intracranial mass effect. 2. No definite acute infarct. Mildly increased T2 signal in the left hippocampal complex, might relate to recent seizure activity. 3. Age advanced cerebellar volume loss is nonspecific and along with hyperostosis of the calvarium can be seen as the sequelae of chronic anti seizure therapy.   Electronically Signed   By: Lars Pinks M.D.   On: 11/27/2014 18:50   Dg Chest Port 1 View  11/26/2014   CLINICAL DATA:  Acute altered mental status  EXAM: PORTABLE CHEST - 1 VIEW  COMPARISON:  None.  FINDINGS: Low volume exam accentuating the heart size. Basilar atelectasis noted. No definite pneumonia or edema. Negative for effusion or pneumothorax. Trachea midline. No acute osseous finding.  IMPRESSION: Low volume exam without acute process   Electronically Signed   By: Daryll Brod M.D.   On: 11/26/2014 11:24    Microbiology: Recent Results (from the past 240 hour(s))  MRSA PCR Screening     Status: None   Collection Time: 11/26/14  4:32 PM  Result Value Ref Range Status   MRSA by  PCR NEGATIVE NEGATIVE Final    Comment:        The GeneXpert MRSA Assay (FDA approved for NASAL specimens only), is one component of a comprehensive MRSA colonization surveillance program. It is not intended to diagnose MRSA infection nor to guide or monitor treatment for MRSA infections.      Labs: Basic Metabolic Panel:  Recent Labs Lab 11/26/14 1309 11/27/14 0925 11/28/14 0313 11/29/14 0406  NA 146 145 142 141  K 3.9 4.1 3.4* 3.8  CL 109 110 108 108  CO2  --  21 21 22   GLUCOSE 101* 82 91 101*  BUN 16 13 11 7   CREATININE 0.90 0.75 0.76 0.81  CALCIUM  --  8.9 8.4 8.8  MG  --  2.3 2.0  --    Liver Function  Tests: No results for input(s): AST, ALT, ALKPHOS, BILITOT, PROT, ALBUMIN in the last 168 hours. No results for input(s): LIPASE, AMYLASE in the last 168 hours. No results for input(s): AMMONIA in the last 168 hours. CBC:  Recent Labs Lab 11/26/14 1255 11/26/14 1309 11/27/14 0925 11/28/14 0313 11/29/14 0406  WBC 9.8  --  6.7 6.8 6.7  NEUTROABS 7.8*  --   --   --   --   HGB 15.4 16.0 14.5 13.9 13.6  HCT 44.5 47.0 41.8 41.5 40.4  MCV 90.6  --  89.5 89.6 88.8  PLT 184  --  173 137* 156   Cardiac Enzymes:  Recent Labs Lab 11/26/14 1255  CKTOTAL 945*   BNP: BNP (last 3 results) No results for input(s): PROBNP in the last 8760 hours. CBG:  Recent Labs Lab 11/26/14 1052  GLUCAP 100*    Signed:  Tadarrius Burch K  [facility] 12/01/2014, 10:11 AM

## 2014-12-01 NOTE — Progress Notes (Signed)
Patient's discharge instructions and medications discussed with patient. Both IVs removed, tele removed, patient awaiting pick up from [facility]. Report given to [facility] by RN.

## 2014-12-01 NOTE — Progress Notes (Signed)
UR COMPLETED  

## 2014-12-01 NOTE — Discharge Instructions (Signed)
Seizure, Adult A seizure means there is unusual activity in the brain. A seizure can cause changes in attention or behavior. Seizures often cause shaking (convulsions). Seizures often last from 30 seconds to 2 minutes. HOME CARE   If you are given medicines, take them exactly as told by your doctor.  Keep all doctor visits as told.  Do not swim or drive until your doctor says it is okay.  Teach others what to do if you have a seizure. They should:  Lay you on the ground.  Put a cushion under your head.  Loosen any tight clothing around your neck.  Turn you on your side.  Stay with you until you get better. GET HELP RIGHT AWAY IF:   The seizure lasts longer than 2 to 5 minutes.  The seizure is very bad.  The person does not wake up after the seizure.  The person's attention or behavior changes. Drive the person to the emergency room or call your local emergency services (911 in [place] MAKE SURE YOU:   Understand these instructions.  Will watch your condition.  Will get help right away if you are not doing well or get worse. Document Released: 05/26/2008 Document Revised: 03/01/2012 Document Reviewed: 11/26/2011 ExitCare Patient Information 2015 [employer]. This information is not intended to replace advice given to you by your health care provider. Make sure you discuss any questions you have with your health care provider.  

## 2014-12-05 ENCOUNTER — Non-Acute Institutional Stay (SKILLED_NURSING_FACILITY): Payer: Commercial Managed Care - HMO | Admitting: Internal Medicine

## 2014-12-05 DIAGNOSIS — E876 Hypokalemia: Secondary | ICD-10-CM

## 2014-12-05 DIAGNOSIS — R260 Ataxic gait: Secondary | ICD-10-CM

## 2014-12-05 DIAGNOSIS — G40909 Epilepsy, unspecified, not intractable, without status epilepticus: Secondary | ICD-10-CM

## 2014-12-05 DIAGNOSIS — G40309 Generalized idiopathic epilepsy and epileptic syndromes, not intractable, without status epilepticus: Secondary | ICD-10-CM
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
## 2014-12-18 ENCOUNTER — Encounter (HOSPITAL_COMMUNITY)
Admission: EM | Disposition: A | Discharge status: Home Health | Payer: Self-pay | Source: Home / Self Care | Attending: Neurosurgery

## 2014-12-18 ENCOUNTER — Non-Acute Institutional Stay (SKILLED_NURSING_FACILITY): Payer: Commercial Managed Care - HMO | Admitting: Nurse Practitioner

## 2014-12-18 ENCOUNTER — Emergency Department (HOSPITAL_COMMUNITY): Payer: Medicare HMO

## 2014-12-18 ENCOUNTER — Inpatient Hospital Stay (HOSPITAL_COMMUNITY): Payer: Medicare HMO | Admitting: Certified Registered Nurse Anesthetist

## 2014-12-18 ENCOUNTER — Encounter (HOSPITAL_COMMUNITY): Payer: Self-pay

## 2014-12-18 ENCOUNTER — Inpatient Hospital Stay (HOSPITAL_COMMUNITY)
Admission: EM | Admit: 2014-12-18 | Discharge: 2014-12-25 | DRG: 026 | Disposition: A | Discharge status: Home Health | Payer: Medicare HMO | Attending: Neurosurgery | Admitting: Neurosurgery

## 2014-12-18 DIAGNOSIS — I6200 Nontraumatic subdural hemorrhage, unspecified: Secondary | ICD-10-CM | POA: Diagnosis present

## 2014-12-18 DIAGNOSIS — S065X0A Traumatic subdural hemorrhage without loss of consciousness, initial encounter: Secondary | ICD-10-CM | POA: Diagnosis present

## 2014-12-18 DIAGNOSIS — G3184 Mild cognitive impairment, so stated: Secondary | ICD-10-CM | POA: Diagnosis present

## 2014-12-18 DIAGNOSIS — G8114 Spastic hemiplegia affecting left nondominant side: Secondary | ICD-10-CM | POA: Diagnosis present

## 2014-12-18 DIAGNOSIS — Y92129 Unspecified place in nursing home as the place of occurrence of the external cause: Secondary | ICD-10-CM | POA: Diagnosis not present

## 2014-12-18 DIAGNOSIS — R471 Dysarthria and anarthria: Secondary | ICD-10-CM

## 2014-12-18 DIAGNOSIS — S065XAA Traumatic subdural hemorrhage with loss of consciousness status unknown, initial encounter: Secondary | ICD-10-CM

## 2014-12-18 DIAGNOSIS — M6289 Other specified disorders of muscle: Secondary | ICD-10-CM

## 2014-12-18 DIAGNOSIS — G40909 Epilepsy, unspecified, not intractable, without status epilepticus: Secondary | ICD-10-CM

## 2014-12-18 DIAGNOSIS — R531 Weakness: Secondary | ICD-10-CM

## 2014-12-18 DIAGNOSIS — E669 Obesity, unspecified: Secondary | ICD-10-CM | POA: Diagnosis present

## 2014-12-18 DIAGNOSIS — Z6832 Body mass index (BMI) 32.0-32.9, adult: Secondary | ICD-10-CM

## 2014-12-18 DIAGNOSIS — Z87820 Personal history of traumatic brain injury: Secondary | ICD-10-CM

## 2014-12-18 DIAGNOSIS — W19XXXA Unspecified fall, initial encounter: Secondary | ICD-10-CM

## 2014-12-18 DIAGNOSIS — W07XXXA Fall from chair, initial encounter: Secondary | ICD-10-CM | POA: Diagnosis present

## 2014-12-18 DIAGNOSIS — S065X9A Traumatic subdural hemorrhage with loss of consciousness of unspecified duration, initial encounter: Secondary | ICD-10-CM

## 2014-12-18 DIAGNOSIS — Z87828 Personal history of other (healed) physical injury and trauma: Secondary | ICD-10-CM

## 2014-12-18 DIAGNOSIS — G40309 Generalized idiopathic epilepsy and epileptic syndromes, not intractable, without status epilepticus: Secondary | ICD-10-CM

## 2014-12-18 HISTORY — DX: Personal history of other (healed) physical injury and trauma: Z87.828

## 2014-12-18 HISTORY — PX: BURR HOLE: SHX908

## 2014-12-18 LAB — CBC WITH DIFFERENTIAL/PLATELET
BASOS ABS: 0 10*3/uL (ref 0.0–0.1)
BASOS PCT: 1 % (ref 0–1)
Eosinophils Absolute: 0.2 10*3/uL (ref 0.0–0.7)
Eosinophils Relative: 5 % (ref 0–5)
HEMATOCRIT: 40.4 % (ref 39.0–52.0)
Hemoglobin: 13.6 g/dL (ref 13.0–17.0)
Lymphocytes Relative: 40 % (ref 12–46)
Lymphs Abs: 1.7 10*3/uL (ref 0.7–4.0)
MCH: 30.8 pg (ref 26.0–34.0)
MCHC: 33.7 g/dL (ref 30.0–36.0)
MCV: 91.6 fL (ref 78.0–100.0)
MONO ABS: 0.5 10*3/uL (ref 0.1–1.0)
Monocytes Relative: 12 % (ref 3–12)
NEUTROS ABS: 1.8 10*3/uL (ref 1.7–7.7)
Neutrophils Relative %: 42 % — ABNORMAL LOW (ref 43–77)
Platelets: 170 10*3/uL (ref 150–400)
RBC: 4.41 MIL/uL (ref 4.22–5.81)
RDW: 13.5 % (ref 11.5–15.5)
WBC: 4.2 10*3/uL (ref 4.0–10.5)

## 2014-12-18 LAB — I-STAT CHEM 8, ED
BUN: 8 mg/dL (ref 6–23)
CALCIUM ION: 1.15 mmol/L (ref 1.12–1.23)
CHLORIDE: 103 meq/L (ref 96–112)
CREATININE: 1 mg/dL (ref 0.50–1.35)
GLUCOSE: 90 mg/dL (ref 70–99)
HCT: 41 % (ref 39.0–52.0)
Hemoglobin: 13.9 g/dL (ref 13.0–17.0)
POTASSIUM: 4.6 mmol/L (ref 3.5–5.1)
Sodium: 138 mmol/L (ref 135–145)
TCO2: 28 mmol/L (ref 0–100)

## 2014-12-18 LAB — URINALYSIS, ROUTINE W REFLEX MICROSCOPIC
Bilirubin Urine: NEGATIVE
GLUCOSE, UA: NEGATIVE mg/dL
Hgb urine dipstick: NEGATIVE
KETONES UR: NEGATIVE mg/dL
LEUKOCYTES UA: NEGATIVE
NITRITE: NEGATIVE
Protein, ur: NEGATIVE mg/dL
Specific Gravity, Urine: 1.017 (ref 1.005–1.030)
Urobilinogen, UA: 1 mg/dL (ref 0.0–1.0)
pH: 6.5 (ref 5.0–8.0)

## 2014-12-18 LAB — COMPREHENSIVE METABOLIC PANEL
ALBUMIN: 3.9 g/dL (ref 3.5–5.2)
ALK PHOS: 90 U/L (ref 39–117)
ALT: 19 U/L (ref 0–53)
AST: 25 U/L (ref 0–37)
Anion gap: 4 — ABNORMAL LOW (ref 5–15)
BUN: 8 mg/dL (ref 6–23)
CO2: 28 mmol/L (ref 19–32)
Calcium: 8.6 mg/dL (ref 8.4–10.5)
Chloride: 104 mEq/L (ref 96–112)
Creatinine, Ser: 0.95 mg/dL (ref 0.50–1.35)
GFR calc Af Amer: >90 mL/min (ref >90)
GFR calc non Af Amer: >90 mL/min (ref >90)
Glucose, Bld: 92 mg/dL (ref 70–99)
POTASSIUM: 4.7 mmol/L (ref 3.5–5.1)
SODIUM: 136 mmol/L (ref 135–145)
TOTAL PROTEIN: 6.9 g/dL (ref 6.0–8.3)
Total Bilirubin: 1.2 mg/dL (ref 0.3–1.2)

## 2014-12-18 LAB — MRSA PCR SCREENING: MRSA BY PCR: NEGATIVE

## 2014-12-18 LAB — RAPID URINE DRUG SCREEN, HOSP PERFORMED
AMPHETAMINES: NOT DETECTED
BARBITURATES: NOT DETECTED
Benzodiazepines: NOT DETECTED
Cocaine: NOT DETECTED
Opiates: NOT DETECTED
Tetrahydrocannabinol: NOT DETECTED

## 2014-12-18 LAB — I-STAT TROPONIN, ED: Troponin i, poc: 0 ng/mL (ref 0.00–0.08)

## 2014-12-18 LAB — PROTIME-INR
INR: 1.17 (ref 0.00–1.49)
PROTHROMBIN TIME: 15.1 s (ref 11.6–15.2)

## 2014-12-18 LAB — APTT: aPTT: 25 seconds (ref 24–37)

## 2014-12-18 SURGERY — CREATION, CRANIAL BURR HOLE
Anesthesia: General | Site: Head | Laterality: Right

## 2014-12-18 MED ORDER — HEMOSTATIC AGENTS (NO CHARGE) OPTIME
TOPICAL | Status: DC | PRN
  Administered 2014-12-18: 1 via TOPICAL

## 2014-12-18 MED ORDER — FENTANYL CITRATE 0.05 MG/ML IJ SOLN
INTRAMUSCULAR | Status: DC | PRN
  Administered 2014-12-18 (×2): 50 ug via INTRAVENOUS

## 2014-12-18 MED ORDER — ONDANSETRON HCL 4 MG/2ML IJ SOLN
4.0000 mg | INTRAMUSCULAR | Status: DC | PRN
Start: 2014-12-18 — End: 2014-12-25

## 2014-12-18 MED ORDER — SODIUM CHLORIDE 0.9 % IR SOLN
Status: DC | PRN
  Administered 2014-12-18: 500 mL

## 2014-12-18 MED ORDER — PROMETHAZINE HCL 25 MG PO TABS: 12.5000 mg | ORAL_TABLET | ORAL | Status: DC | PRN

## 2014-12-18 MED ORDER — NEOSTIGMINE METHYLSULFATE 10 MG/10ML IV SOLN
INTRAVENOUS | Status: DC | PRN
  Administered 2014-12-18: 3 mg via INTRAVENOUS

## 2014-12-18 MED ORDER — HYDROCODONE-ACETAMINOPHEN 5-325 MG PO TABS
1.0000 | ORAL_TABLET | ORAL | Status: DC | PRN
  Administered 2014-12-18 – 2014-12-25 (×11): 1 via ORAL
  Filled 2014-12-18 (×11): qty 1

## 2014-12-18 MED ORDER — FENTANYL CITRATE 0.05 MG/ML IJ SOLN
INTRAMUSCULAR | Status: AC
  Filled 2014-12-18: qty 5

## 2014-12-18 MED ORDER — BACITRACIN 50000 UNITS IM SOLR
INTRAMUSCULAR | Status: DC
  Filled 2014-12-18: qty 1

## 2014-12-18 MED ORDER — SUCCINYLCHOLINE CHLORIDE 20 MG/ML IJ SOLN
INTRAMUSCULAR | Status: AC
  Filled 2014-12-18: qty 1

## 2014-12-18 MED ORDER — MANNITOL 25 % IV SOLN
INTRAVENOUS | Status: AC
  Filled 2014-12-18: qty 50

## 2014-12-18 MED ORDER — ONDANSETRON HCL 4 MG PO TABS: 4.0000 mg | ORAL_TABLET | ORAL | Status: DC | PRN

## 2014-12-18 MED ORDER — CEFAZOLIN SODIUM 1-5 GM-% IV SOLN
1.0000 g | Freq: Three times a day (TID) | INTRAVENOUS | Status: AC
  Administered 2014-12-19 (×2): 1 g via INTRAVENOUS
  Filled 2014-12-18 (×2): qty 50

## 2014-12-18 MED ORDER — ONDANSETRON HCL 4 MG/2ML IJ SOLN
INTRAMUSCULAR | Status: AC
  Filled 2014-12-18: qty 2

## 2014-12-18 MED ORDER — GLYCOPYRROLATE 0.2 MG/ML IJ SOLN
INTRAMUSCULAR | Status: DC | PRN
  Administered 2014-12-18: 0.4 mg via INTRAVENOUS

## 2014-12-18 MED ORDER — LIDOCAINE HCL (CARDIAC) 20 MG/ML IV SOLN
INTRAVENOUS | Status: DC | PRN
  Administered 2014-12-18: 80 mg via INTRAVENOUS

## 2014-12-18 MED ORDER — SODIUM CHLORIDE 0.9 % IV SOLN
INTRAVENOUS | Status: DC | PRN
  Administered 2014-12-18: 18:00:00 via INTRAVENOUS

## 2014-12-18 MED ORDER — ONDANSETRON HCL 4 MG/2ML IJ SOLN
INTRAMUSCULAR | Status: DC | PRN
  Administered 2014-12-18: 4 mg via INTRAVENOUS

## 2014-12-18 MED ORDER — ACETAMINOPHEN 325 MG PO TABS: 650.0000 mg | ORAL_TABLET | ORAL | Status: DC | PRN

## 2014-12-18 MED ORDER — NALOXONE HCL 0.4 MG/ML IJ SOLN: 0.0800 mg | INTRAMUSCULAR | Status: DC | PRN

## 2014-12-18 MED ORDER — CARBAMAZEPINE ER 400 MG PO TB12
400.0000 mg | ORAL_TABLET | Freq: Two times a day (BID) | ORAL | Status: DC
  Administered 2014-12-18 – 2014-12-25 (×14): 400 mg via ORAL
  Filled 2014-12-18 (×16): qty 1

## 2014-12-18 MED ORDER — ROCURONIUM BROMIDE 50 MG/5ML IV SOLN
INTRAVENOUS | Status: AC
  Filled 2014-12-18: qty 1

## 2014-12-18 MED ORDER — ROCURONIUM BROMIDE 100 MG/10ML IV SOLN
INTRAVENOUS | Status: DC | PRN
  Administered 2014-12-18: 20 mg via INTRAVENOUS

## 2014-12-18 MED ORDER — HYDROMORPHONE HCL 1 MG/ML IJ SOLN: 0.5000 mg | INTRAMUSCULAR | Status: DC | PRN

## 2014-12-18 MED ORDER — PROPOFOL 10 MG/ML IV BOLUS
INTRAVENOUS | Status: AC
  Filled 2014-12-18: qty 20

## 2014-12-18 MED ORDER — PROPOFOL 10 MG/ML IV BOLUS
INTRAVENOUS | Status: DC | PRN
  Administered 2014-12-18: 180 mg via INTRAVENOUS
  Administered 2014-12-18: 20 mg via INTRAVENOUS

## 2014-12-18 MED ORDER — MIDAZOLAM HCL 2 MG/2ML IJ SOLN
INTRAMUSCULAR | Status: AC
  Filled 2014-12-18: qty 2

## 2014-12-18 MED ORDER — ACETAMINOPHEN 650 MG RE SUPP: 650.0000 mg | RECTAL | Status: DC | PRN

## 2014-12-18 MED ORDER — BUPIVACAINE HCL (PF) 0.25 % IJ SOLN
INTRAMUSCULAR | Status: DC | PRN
  Administered 2014-12-18: 2 mL

## 2014-12-18 MED ORDER — LEVETIRACETAM IN NACL 1000 MG/100ML IV SOLN
1000.0000 mg | Freq: Two times a day (BID) | INTRAVENOUS | Status: DC
  Administered 2014-12-18: 1000 mg via INTRAVENOUS
  Filled 2014-12-18 (×3): qty 100

## 2014-12-18 MED ORDER — CEFAZOLIN SODIUM-DEXTROSE 2-3 GM-% IV SOLR
INTRAVENOUS | Status: DC | PRN
  Administered 2014-12-18: 2 g via INTRAVENOUS

## 2014-12-18 MED ORDER — PANTOPRAZOLE SODIUM 40 MG IV SOLR
40.0000 mg | Freq: Every day | INTRAVENOUS | Status: DC
  Administered 2014-12-18: 40 mg via INTRAVENOUS
  Filled 2014-12-18 (×2): qty 40

## 2014-12-18 MED ORDER — 0.9 % SODIUM CHLORIDE (POUR BTL) OPTIME
TOPICAL | Status: DC | PRN
  Administered 2014-12-18 (×2): 1000 mL

## 2014-12-18 MED ORDER — THROMBIN 5000 UNITS EX SOLR
CUTANEOUS | Status: DC | PRN
  Administered 2014-12-18 (×2): 5000 [IU] via TOPICAL

## 2014-12-18 MED ORDER — SUCCINYLCHOLINE CHLORIDE 20 MG/ML IJ SOLN
INTRAMUSCULAR | Status: DC | PRN
  Administered 2014-12-18: 100 mg via INTRAVENOUS

## 2014-12-18 SURGICAL SUPPLY — 60 items
BAG DECANTER FOR FLEXI CONT (MISCELLANEOUS) ×2 IMPLANT
BANDAGE GAUZE 4  KLING STR (GAUZE/BANDAGES/DRESSINGS) IMPLANT
BLADE CLIPPER SURG (BLADE) IMPLANT
BLADE SURG 11 STRL SS (BLADE) ×2 IMPLANT
BNDG COHESIVE 4X5 TAN NS LF (GAUZE/BANDAGES/DRESSINGS) IMPLANT
BRUSH SCRUB EZ PLAIN DRY (MISCELLANEOUS) ×2 IMPLANT
BUR ACORN 6.0 (BURR) ×2 IMPLANT
BUR ADDG 1.1 (BURR) IMPLANT
CANISTER SUCT 3000ML (MISCELLANEOUS) ×2 IMPLANT
CLIP TI MEDIUM 6 (CLIP) IMPLANT
CONT SPEC 4OZ CLIKSEAL STRL BL (MISCELLANEOUS) ×2 IMPLANT
CORDS BIPOLAR (ELECTRODE) ×2 IMPLANT
DECANTER SPIKE VIAL GLASS SM (MISCELLANEOUS) ×2 IMPLANT
DRAPE NEUROLOGICAL W/INCISE (DRAPES) IMPLANT
DRAPE WARM FLUID 44X44 (DRAPE) ×2 IMPLANT
ELECT CAUTERY BLADE 6.4 (BLADE) ×2 IMPLANT
ELECT REM PT RETURN 9FT ADLT (ELECTROSURGICAL) ×2
ELECTRODE REM PT RTRN 9FT ADLT (ELECTROSURGICAL) ×1 IMPLANT
GAUZE SPONGE 4X4 12PLY STRL (GAUZE/BANDAGES/DRESSINGS) ×2 IMPLANT
GAUZE SPONGE 4X4 16PLY XRAY LF (GAUZE/BANDAGES/DRESSINGS) IMPLANT
GLOVE BIO SURGEON STRL SZ 6.5 (GLOVE) ×2 IMPLANT
GLOVE BIO SURGEON STRL SZ7 (GLOVE) ×2 IMPLANT
GLOVE BIO SURGEON STRL SZ7.5 (GLOVE) ×2 IMPLANT
GLOVE ECLIPSE 9.0 STRL (GLOVE) IMPLANT
GLOVE EXAM NITRILE LRG STRL (GLOVE) IMPLANT
GLOVE EXAM NITRILE MD LF STRL (GLOVE) IMPLANT
GLOVE EXAM NITRILE XL STR (GLOVE) IMPLANT
GLOVE EXAM NITRILE XS STR PU (GLOVE) IMPLANT
GLOVE INDICATOR 6.5 STRL GRN (GLOVE) ×2 IMPLANT
GOWN STRL REUS W/ TWL LRG LVL3 (GOWN DISPOSABLE) ×1 IMPLANT
GOWN STRL REUS W/ TWL XL LVL3 (GOWN DISPOSABLE) ×1 IMPLANT
GOWN STRL REUS W/TWL 2XL LVL3 (GOWN DISPOSABLE) ×2 IMPLANT
GOWN STRL REUS W/TWL LRG LVL3 (GOWN DISPOSABLE) ×1
GOWN STRL REUS W/TWL XL LVL3 (GOWN DISPOSABLE) ×1
HEMOSTAT SURGICEL 2X14 (HEMOSTASIS) IMPLANT
HOOK DURA (MISCELLANEOUS) ×2 IMPLANT
KIT BASIN OR (CUSTOM PROCEDURE TRAY) ×2 IMPLANT
KIT ROOM TURNOVER OR (KITS) ×2 IMPLANT
LIQUID BAND (GAUZE/BANDAGES/DRESSINGS) ×2 IMPLANT
NEEDLE HYPO 25X1 1.5 SAFETY (NEEDLE) ×2 IMPLANT
NS IRRIG 1000ML POUR BTL (IV SOLUTION) ×2 IMPLANT
PACK CRANIOTOMY (CUSTOM PROCEDURE TRAY) ×2 IMPLANT
PAD ARMBOARD 7.5X6 YLW CONV (MISCELLANEOUS) ×6 IMPLANT
PATTIES SURGICAL .25X.25 (GAUZE/BANDAGES/DRESSINGS) IMPLANT
PATTIES SURGICAL .5 X.5 (GAUZE/BANDAGES/DRESSINGS) IMPLANT
PATTIES SURGICAL .5 X3 (DISPOSABLE) IMPLANT
PATTIES SURGICAL 1X1 (DISPOSABLE) IMPLANT
PIN MAYFIELD SKULL DISP (PIN) IMPLANT
SPONGE NEURO XRAY DETECT 1X3 (DISPOSABLE) IMPLANT
SPONGE SURGIFOAM ABS GEL 100 (HEMOSTASIS) IMPLANT
STAPLER VISISTAT 35W (STAPLE) ×2 IMPLANT
SUT NURALON 4 0 TR CR/8 (SUTURE) ×2 IMPLANT
SUT VIC AB 2-0 CT1 18 (SUTURE) ×2 IMPLANT
SYR 20ML ECCENTRIC (SYRINGE) ×2 IMPLANT
SYR CONTROL 10ML LL (SYRINGE) ×2 IMPLANT
TAPE CLOTH SURG 4X10 WHT LF (GAUZE/BANDAGES/DRESSINGS) ×2 IMPLANT
TOWEL OR 17X24 6PK STRL BLUE (TOWEL DISPOSABLE) ×2 IMPLANT
TOWEL OR 17X26 10 PK STRL BLUE (TOWEL DISPOSABLE) ×2 IMPLANT
TRAY FOLEY CATH 14FRSI W/METER (CATHETERS) IMPLANT
WATER STERILE IRR 1000ML POUR (IV SOLUTION) ×2 IMPLANT

## 2014-12-18 NOTE — Transfer of Care (Addendum)
Immediate Anesthesia Transfer of Care Note  Patient: Patrick Torres  Procedure(s) Performed: Procedure(s): BURR HOLES (Right)  Patient Location: ICU  Anesthesia Type:General  Level of Consciousness: sleepy, but will follow commands such as "wiggle your toes" and "give me a thumbs up"   Airway & Oxygen Therapy: Patient Spontanous Breathing and Patient connected to face mask oxygen  Post-op Assessment: Report given to PACU RN, Post -op Vital signs reviewed and stable, Patient moving all extremities and Patient moving all extremities X 4  Post vital signs: Reviewed and stable  Complications: No apparent anesthesia complications
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
## 2014-12-18 NOTE — Anesthesia Preprocedure Evaluation (Addendum)
Anesthesia Evaluation  Patient identified by MRN, date of birth, ID band Patient awake    Airway Mallampati: IV  TM Distance: >3 FB Neck ROM: Full    Dental  (+) Edentulous Upper, Edentulous Lower   Pulmonary neg pulmonary ROS,  breath sounds clear to auscultation  Pulmonary exam normal       Cardiovascular negative cardio ROS  Rhythm:Regular Rate:Bradycardia     Neuro/Psych Seizures -,  Traumatic brain injury previously     GI/Hepatic negative GI ROS, Neg liver ROS,   Endo/Other  negative endocrine ROS  Renal/GU negative Renal ROS     Musculoskeletal   Abdominal (+) + obese,   Peds  Hematology negative hematology ROS (+)   Anesthesia Other Findings   Reproductive/Obstetrics                            Anesthesia Physical Anesthesia Plan  ASA: III and emergent  Anesthesia Plan: General   Post-op Pain Management:    Induction: Intravenous  Airway Management Planned: Oral ETT  Additional Equipment:   Intra-op Plan:   Post-operative Plan: Extubation in OR  Informed Consent: I have reviewed the patients History and Physical, chart, labs and discussed the procedure including the risks, benefits and alternatives for the proposed anesthesia with the patient or authorized representative who has indicated his/her understanding and acceptance.     Plan Discussed with: CRNA and Surgeon  Anesthesia Plan Comments:        Anesthesia Quick Evaluation
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
## 2014-12-18 NOTE — ED Notes (Signed)
MD at bedside. EDP YAO PRESENT TO SPEAK WITH PT

## 2014-12-18 NOTE — Brief Op Note (Signed)
12/18/2014  8:15 PM  PATIENT:  Patrick Torres  53 y.o. male  PRE-OPERATIVE DIAGNOSIS:  Subdural Hematoma  POST-OPERATIVE DIAGNOSIS:  Subdural Hematoma  PROCEDURE:  Procedure(s): BURR HOLES (Right)  SURGEON:  Surgeon(s) and Role:    * Charlie Pitter, MD - Primary  PHYSICIAN ASSISTANT:   ASSISTANTS: none   ANESTHESIA:   general  EBL:  Total I/O In: -  Out: 220 [Urine:120; Blood:100]  BLOOD ADMINISTERED:none  DRAINS: (10 mm Blake) Jackson-Pratt drain(s) with closed bulb suction in the Subdural space   LOCAL MEDICATIONS USED:  MARCAINE     SPECIMEN:  No Specimen  DISPOSITION OF SPECIMEN:  N/A  COUNTS:  YES  TOURNIQUET:  * No tourniquets in log *  DICTATION: .Dragon Dictation  PLAN OF CARE: Admit to inpatient   PATIENT DISPOSITION:  PACU - hemodynamically stable.   Delay start of Pharmacological VTE agent (>24hrs) due to surgical blood loss or risk of bleeding: yes

## 2014-12-18 NOTE — ED Notes (Signed)
Patient transported to CT 

## 2014-12-18 NOTE — ED Notes (Signed)
Report given to [facility]. 

## 2014-12-18 NOTE — ED Provider Notes (Signed)
Angiocath insertion Performed by: Osvaldo Shipper  Consent: Verbal consent obtained. Risks and benefits: risks, benefits and alternatives were discussed Time out: Immediately prior to procedure a "time out" was called to verify the correct patient, procedure, equipment, support staff and site/side marked as required.  Preparation: Patient was prepped and draped in the usual sterile fashion.  Vein Location: R AC  Yes Ultrasound Guided  Gauge: 20g  Normal blood return and flush without difficulty Patient tolerance: Patient tolerated the procedure well with no immediate complications.     Evelina Bucy, MD 12/18/14 [phone]

## 2014-12-18 NOTE — ED Notes (Signed)
Bed: WA16 Expected date:  Expected time:  Means of arrival:  Comments: EMS SZ 
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
## 2014-12-18 NOTE — Anesthesia Procedure Notes (Signed)
Procedure Name: Intubation Date/Time: 12/18/2014 7:08 PM Performed by: Shirlyn Goltz Pre-anesthesia Checklist: Patient identified, Emergency Drugs available, Suction available and Patient being monitored Patient Re-evaluated:Patient Re-evaluated prior to inductionOxygen Delivery Method: Circle system utilized Preoxygenation: Pre-oxygenation with 100% oxygen Intubation Type: IV induction Laryngoscope Size: Mac and 4 Grade View: Grade II Tube type: Subglottic suction tube Tube size: 8.0 mm Number of attempts: 1 Airway Equipment and Method: Stylet Placement Confirmation: ETT inserted through vocal cords under direct vision,  positive ETCO2 and breath sounds checked- equal and bilateral Secured at: 21 cm Tube secured with: Tape Dental Injury: Teeth and Oropharynx as per pre-operative assessment

## 2014-12-18 NOTE — ED Notes (Signed)
Per PTAR. Pt resides at [facility]. Pt sent there for Rehab of generalized weakness. Admit 12/01/2014. HX of left sided weakness from seizures/TBI. Pt states increased weakness to left side since Friday. Per PTAR NEG STROKE. Swelling to left arm not normal. Pt able to move all extremities. GCS 15. PEARL. Denies any other complaints.

## 2014-12-19 ENCOUNTER — Encounter (HOSPITAL_COMMUNITY): Payer: Self-pay | Admitting: Neurosurgery

## 2014-12-19 ENCOUNTER — Inpatient Hospital Stay (HOSPITAL_COMMUNITY): Payer: Medicare HMO

## 2014-12-19 MED ORDER — LEVETIRACETAM 500 MG PO TABS
1000.0000 mg | ORAL_TABLET | Freq: Two times a day (BID) | ORAL | Status: DC
  Administered 2014-12-19 – 2014-12-25 (×13): 1000 mg via ORAL
  Filled 2014-12-19 (×11): qty 2
  Filled 2014-12-19: qty 4
  Filled 2014-12-19 (×4): qty 2

## 2014-12-19 MED ORDER — PANTOPRAZOLE SODIUM 40 MG PO TBEC
40.0000 mg | DELAYED_RELEASE_TABLET | Freq: Every day | ORAL | Status: DC
Start: 2014-12-19 — End: 2014-12-25
  Administered 2014-12-19 – 2014-12-25 (×7): 40 mg via ORAL
  Filled 2014-12-19 (×7): qty 1

## 2014-12-19 NOTE — Plan of Care (Signed)
Problem: Phase I Progression Outcomes Goal: No focal neurological deficits Outcome: Progressing Progressing back to baseline

## 2014-12-19 NOTE — Progress Notes (Addendum)
Postop day 1.  Patient awake and alert. Speech much more brisk in fluent. Complains of mild headache no other complaints. Says his left side is working better.  Vital signs are stable. Afebrile. Urine output good. Awake and alert. Oriented and appropriate. Speech reasonably fluent. Motor examination 5/5 right upper and lower extremity.4/5 left upper and lower extremity with some residual spasticity improved from preop. Wound clean and dry.  Postop CT scan with resolution of right frontal subdural hematoma. Left-sided hematoma unchanged. Still some right frontal air but overall things look good.  Doing well. Mobilize today. Continue ICU observation. Probably will remove drain tomorrow and send to floor

## 2014-12-20 NOTE — Clinical Social Work Note (Signed)
Clinical Social Work Department BRIEF PSYCHOSOCIAL ASSESSMENT 12/20/2014  Patient:  Patrick Torres, Patrick Torres     Account Number:  [id number]     Admit date:  12/18/2014  Clinical Social Worker:  Myles Lipps  Date/Time:  12/20/2014 03:00 PM  Referred by:  RN  Date Referred:  12/20/2014 Referred for  SNF Placement   Other Referral:   Interview type:  Patient Other interview type:   Spoke with patient sister over the phone per patient request    PSYCHOSOCIAL DATA Living Status:  FACILITY Admitted from facility:  [facility] Level of care:  [place] Primary support name:  Milagros Reap  [phone] Primary support relationship to patient:  [place] Degree of support available:   Strong    CURRENT CONCERNS Current Concerns  Post-Acute Placement   Other Concerns:    SOCIAL WORK ASSESSMENT / PLAN Clinical Social Worker met with patient and spoke with patient sister over the phone to offer support and discuss patient needs at discharge.  Patient is currently a resident at [facility], however him and his sister have decided not to return.  Patient and patient sister are agreeable to new SNF search in [place] and [place].  Patient sister expresses interest in [facility] [place] who have extended a bed offer.  CSW left a message with patient sister to relay bed offer and will follow up to further discuss tomorrow.  CSW spoke with [facility] representative who states that patient is approved for SNF stay and to notify on day of discharge to which facility [phone]).  CSW remains available for support and to facilitate patient discharge needs once medically stable.   Assessment/plan status:  Psychosocial Support/Ongoing Assessment of Needs Other assessment/ plan:   Information/referral to community resources:   [facility] representative offered facility list, however patient sister states that she went and toured the facilities the first of the this month  and does not feel the need to do it again.    PATIENT'S/FAMILY'S RESPONSE TO PLAN OF CARE: Patient alert and oriented x3 sitting up in the chair. Patient initially was agreeable with return to [place], however upon conversation with his sister plans have changed.  Patient with good family support and due to history of TBI, family has provided necessary means for patient for many years.  Patient and sister understanding of CSW role and appreciative for support and concern.

## 2014-12-20 NOTE — Clinical Social Work Placement (Signed)
Clinical Social Work Department CLINICAL SOCIAL WORK PLACEMENT NOTE 12/20/2014  Patient:  Patrick Torres, Patrick Torres  Account Number:  [id number] Admit date:  12/18/2014  Clinical Social Worker:  Barbette Or, LCSW  Date/time:  12/20/2014 03:00 PM  Clinical Social Work is seeking post-discharge placement for this patient at the following level of care:   [place]   (*CSW will update this form in Epic as items are completed)   12/20/2014  Patient/family provided with [place] Department of Clinical Social Work's list of facilities offering this level of care within the geographic area requested by the patient (or if unable, by the patient's family).  12/20/2014  Patient/family informed of their freedom to choose among providers that offer the needed level of care, that participate in Medicare, Medicaid or managed care program needed by the patient, have an available bed and are willing to accept the patient.  12/20/2014  Patient/family informed of [facility]' ownership interest in [facility], as well as of the fact that they are under no obligation to receive care at this facility.  PASARR submitted to EDS on  PASARR number received on   FL2 transmitted to all facilities in geographic area requested by pt/family on  12/20/2014 FL2 transmitted to all facilities within larger geographic area on   Patient informed that his/her managed care company has contracts with or will negotiate with  certain facilities, including the following:     Patient/family informed of bed offers received:   Patient chooses bed at  Physician recommends and patient chooses bed at    Patient to be transferred to  on   Patient to be transferred to facility by  Patient and family notified of transfer on  Name of family member notified:    The following physician request were entered in Epic:   Additional Comments: 12/20/14 Patient insurance has provided approval and patient with a current  [facility]

## 2014-12-20 NOTE — Evaluation (Signed)
Physical Therapy Evaluation Patient Details Name: Patrick Torres MRN: [id number] DOB: 04-11-1961 Today's Date: 12/20/2014   History of Present Illness  53 year old male with history of a generalized seizure disorder and prior traumatic brain injury presents with increasing left-sided weakness and spasticity. Patient status post significant fall and/or seizure on December 6. Head CT at that time negative. Patient presented today with increasing weakness. Follow-up head CT scan at emergency department demonstrated a very large right frontal subacute subdural hematoma with mass effect.  Pt s/p burr hole procedure 12/28.  Clinical Impression  Pt motivated and doing well. Pt with L sided weakness, L sided numbness, and balance impairment. Pt con't to benefit from SNF upon d/c to achieve safe mod I function for safe return home alone.    Follow Up Recommendations SNF;Supervision/Assistance - 24 hour    Equipment Recommendations  None recommended by PT    Recommendations for Other Services       Precautions / Restrictions Precautions Precautions: Fall Restrictions Weight Bearing Restrictions: No      Mobility  Bed Mobility               General bed mobility comments: in chair on arrival  Transfers Overall transfer level: Needs assistance Equipment used: Rolling walker (2 wheeled) Transfers: Sit to/from Stand Sit to Stand: Min assist         General transfer comment: v/c's for hand placement, increased time  Ambulation/Gait Ambulation/Gait assistance: Min assist;Mod assist Ambulation Distance (Feet): 75 Feet Assistive device: None;Rolling walker (2 wheeled) Gait Pattern/deviations: Step-through pattern;Decreased step length - left;Decreased stride length;Decreased stance time - left   Gait velocity interpretation: <1.8 ft/sec, indicative of risk for recurrent falls General Gait Details: began with RW then transitioned without RW. Pt with increased L LE stiffness  during advance phase. some L hip hike. pt held bilat UE up in guarded position, encouraged him to lower arms however he reports he's more off balance.  Stairs            Wheelchair Mobility    Modified Rankin (Stroke Patients Only)       Balance Overall balance assessment: Needs assistance Sitting-balance support: Feet supported Sitting balance-Leahy Scale: Good       Standing balance-Leahy Scale: Fair Standing balance comment: static standing fair however with dynamic standing pt requires unilateral UE support                             Pertinent Vitals/Pain Pain Assessment: No/denies pain    Home Living Family/patient expects to be discharged to:: Skilled nursing facility                 Additional Comments: pt was living at home alone until 2 weeks ago    Prior Function           Comments: was working with PT. used RW and w/c     Hand Dominance   Dominant Hand: Right    Extremity/Trunk Assessment   Upper Extremity Assessment: LUE deficits/detail       LUE Deficits / Details: grossly 4-/5 except shld flex 3+/5   Lower Extremity Assessment: LLE deficits/detail   LLE Deficits / Details: grossly 4/5  Cervical / Trunk Assessment: Normal  Communication   Communication: No difficulties  Cognition Arousal/Alertness: Awake/alert Behavior During Therapy: WFL for tasks assessed/performed Overall Cognitive Status: Within Functional Limits for tasks assessed  General Comments      Exercises        Assessment/Plan    PT Assessment Patient needs continued PT services  PT Diagnosis Difficulty walking;Generalized weakness;Abnormality of gait   PT Problem List Decreased strength;Decreased activity tolerance;Decreased balance;Decreased mobility;Decreased coordination;Decreased cognition;Decreased knowledge of use of DME;Decreased safety awareness;Decreased knowledge of precautions  PT Treatment  Interventions DME instruction;Gait training;Functional mobility training;Therapeutic activities;Therapeutic exercise;Balance training;Neuromuscular re-education;Cognitive remediation;Patient/family education   PT Goals (Current goals can be found in the Care Plan section) Acute Rehab PT Goals Patient Stated Goal: wants to improve his balance PT Goal Formulation: With patient/family Time For Goal Achievement: 12/27/14 Potential to Achieve Goals: Good    Frequency Min 4X/week   Barriers to discharge        Co-evaluation               End of Session Equipment Utilized During Treatment: Gait belt Activity Tolerance: Patient tolerated treatment well Patient left: in chair;with call bell/phone within reach;with chair alarm set Nurse Communication: Mobility status;Other (comment)         Time: 6834-1962 PT Time Calculation (min) (ACUTE ONLY): 21 min   Charges:   PT Evaluation $Initial PT Evaluation Tier I: 1 Procedure PT Treatments $Gait Training: 8-22 mins   PT G CodesKingsley Callander 12/20/2014, 4:26 PM   Kittie Plater, PT, DPT Pager #: [phone] Office #: [phone]

## 2014-12-20 NOTE — Progress Notes (Signed)
Postop day 2. Patient looks good. No headache. Left upper and lower extremity working much better. Patient participated in therapy yesterday.  Awake and alert. Oriented and appropriate. Motor and sensory function 5/5 on right 4+/5 on the left. Wound clean and dry. Drain removed.  Overall doing well. Patient returning to his neurologic baseline. Plan transfer to floor. Continue efforts at mobilization. Patient should be able to be discharged to his skilled nursing facility tomorrow or the next day.

## 2014-12-20 NOTE — Progress Notes (Signed)
CARE MANAGEMENT NOTE 12/20/2014  Patient:  Patrick Torres, Patrick Torres   Account Number:  [id number]  Date Initiated:  12/20/2014  Documentation initiated by:  Olga Coaster  Subjective/Objective Assessment:   ADMITTED FOR SURGERY     Action/Plan:   PATIENT RESIDES IN SNF; [place]   Anticipated DC Date:  12/23/2014   Anticipated DC Plan:  SKILLED NURSING FACILITY  In-house referral  Clinical Social Worker      DC Planning Services  CM consult      Status of service:  In process, will continue to follow Medicare Important Message given?   (If response is "NO", the following Medicare IM given date fields will be blank)  Per UR Regulation:  Reviewed for med. necessity/level of care/duration of stay  Comments:  12/30/2015Mindi Slicker RN,BSN,MHA [phone]

## 2014-12-21 NOTE — Evaluation (Signed)
Occupational Therapy Evaluation Patient Details Name: Patrick Torres MRN: [id number] DOB: 07/10/1961 Today's Date: 12/21/2014    History of Present Illness 53 year old male with history of a generalized seizure disorder and prior traumatic brain injury presents with increasing left-sided weakness and spasticity. Patient status post significant fall and/or seizure on December 6. Head CT at that time negative. Patient presented today with increasing weakness. Follow-up head CT scan at emergency department demonstrated a very large right frontal subacute subdural hematoma with mass effect.  Pt s/p burr hole procedure 12/28.   Clinical Impression   PT admitted with burr hole R frontal subacute subdural hematoma with mass effect. Pt currently with functional limitiations due to the deficits listed below (see OT problem list). Pt from [place] and declines return to [facility]. Pt will benefit from skilled OT to increase their independence and safety with adls and balance to allow discharge [facility] with 3n1.     Follow Up Recommendations  Home health OT    Equipment Recommendations  3 in 1 bedside comode (TBA- bathroom DME)    Recommendations for Other Services       Precautions / Restrictions Precautions Precautions: Fall      Mobility Bed Mobility               General bed mobility comments: in chair on arrival  Transfers Overall transfer level: Needs assistance Equipment used: Rolling walker (2 wheeled) Transfers: Sit to/from Stand Sit to Stand: Min guard              Balance           Standing balance support: No upper extremity supported;During functional activity Standing balance-Leahy Scale: Poor Standing balance comment: ankle strategies. Sister states "you dont look so steady. You are wiggling"                            ADL Overall ADL's : Needs assistance/impaired Eating/Feeding: Modified independent;Sitting   Grooming: Oral care;Wash/dry  face;Min guard Grooming Details (indicate cue type and reason): static standing with L LE fatigue             Lower Body Dressing: Min guard;Sit to/from stand Lower Body Dressing Details (indicate cue type and reason): pt and sister educate don threading L UE / LE during dressing due to weakness. Pt able to static sit and don sweat pants Toilet Transfer: Min guard;RW;BSC           Functional mobility during ADLs: Min guard;Rolling walker General ADL Comments: Pt will d/c home to sisters and sister helping (A) during session to help with education. pt with L side weakness and educated sister to guard L side. Sister expressed hx of back pain and limitations. Pt must be Supervision to mod i to go to sisters home. pt needs incr education on safety wtih RW. Pt with LOB with head turns previously with RW     Vision                     Perception     Praxis      Pertinent Vitals/Pain Pain Assessment: No/denies pain     Hand Dominance     Extremity/Trunk Assessment Upper Extremity Assessment Upper Extremity Assessment: LUE deficits/detail LUE Deficits / Details: ataxic movement        Cervical / Trunk Assessment Cervical / Trunk Assessment: Normal   Communication Communication Communication: No difficulties   Cognition Arousal/Alertness: Awake/alert Behavior During Therapy:  WFL for tasks assessed/performed Overall Cognitive Status: Impaired/Different from baseline Area of Impairment: Safety/judgement;Awareness         Safety/Judgement: Decreased awareness of safety Awareness: Emergent   General Comments: Pt requesting to shower independently at end of session. Pt educated on the need to have (A).    General Comments       Exercises       Shoulder Instructions      Home Living Family/patient expects to be discharged to:: Private residence                   Bathroom Shower/Tub: Tub/shower unit   Bathroom Toilet: Standard          Additional Comments: going home with sister with niece and nephew assisting. Pt lives alone but recommendations are to stay with sister until MOD I      Prior Functioning/Environment Level of Independence: Independent             OT Diagnosis: Generalized weakness;Cognitive deficits;Ataxia   OT Problem List: Decreased strength;Decreased activity tolerance;Impaired balance (sitting and/or standing);Decreased safety awareness;Decreased knowledge of use of DME or AE;Decreased knowledge of precautions   OT Treatment/Interventions: Self-care/ADL training;Therapeutic exercise;DME and/or AE instruction;Therapeutic activities;Balance training;Patient/family education    OT Goals(Current goals can be found in the care plan section) Acute Rehab OT Goals Patient Stated Goal: wants to improve his balance OT Goal Formulation: With patient Time For Goal Achievement: 01/04/15 Potential to Achieve Goals: Good  OT Frequency: Min 2X/week   Barriers to D/C:            Co-evaluation              End of Session Equipment Utilized During Treatment: Rolling walker Nurse Communication: Mobility status;Precautions  Activity Tolerance: Patient tolerated treatment well Patient left: in chair;with call bell/phone within reach;with chair alarm set;with family/visitor present   Time: 5631-4970 OT Time Calculation (min): 23 min Charges:  OT General Charges $OT Visit: 1 Procedure OT Evaluation $Initial OT Evaluation Tier I: 1 Procedure OT Treatments $Self Care/Home Management : 8-22 mins G-Codes:    [facility] January 05, 2015, 3:38 PM  Pager: [phone]

## 2014-12-21 NOTE — Clinical Social Work Note (Signed)
CSW  spoke with the pt and the pt's sister Torita. The pt and Torita reported the pt will transition home with family assists. The pt reported feeling he will do better at home than a SNF.  CSW will informed MD and case manager. CSW will sign off.   [place]a, MSW, [place]

## 2014-12-21 NOTE — Progress Notes (Signed)
Patient doing very well. No headache. Left-sided weakness almost entirely resolved. Ambulating with therapy. Making good progress. Patient does not wish to go back to his previous nursing home. He would either prefer going home with family or going to different care facility. Social work currently investigating options. Patient nearing the point where discharges appropriate either to skilled nursing or home with home therapy.

## 2014-12-21 NOTE — Progress Notes (Signed)
Physical Therapy Treatment Patient Details Name: Patrick [facility] MRN: [id number] DOB: [birth year] Today's Date: 12/21/2014    History of Present Illness 53 year old male with history of a generalized seizure disorder and prior traumatic brain injury presents with increasing left-sided weakness and spasticity. Patient status post significant fall and/or seizure on December 6. Head CT at that time negative. Patient presented today with increasing weakness. Follow-up head CT scan at emergency department demonstrated a very large right frontal subacute subdural hematoma with mass effect.  Pt s/p burr hole procedure 12/28.    PT Comments    Patient progressing with mobility. Improved ambulation distance today however continues to exhibit balance deficits requiring Min A to prevent falls. Balance improved intermittently with use of SPC however still exhibits a few LOB posteriorly. After discussion with family and pt, pt would really like to be able to return home. Will attempt gait training with RW to prepare for possible discharge home with a family member. Will continue to follow acutely.   Follow Up Recommendations  SNF;Supervision/Assistance - 24 hour     Equipment Recommendations  None recommended by PT    Recommendations for Other Services       Precautions / Restrictions Precautions Precautions: Fall Restrictions Weight Bearing Restrictions: No    Mobility  Bed Mobility               General bed mobility comments: in chair on arrival  Transfers Overall transfer level: Needs assistance Equipment used: None Transfers: Sit to/from Stand Sit to Stand: Min guard         General transfer comment: Min guard for safety. VC's for hand placement. Stood from chair x3.  Ambulation/Gait Ambulation/Gait assistance: Min assist Ambulation Distance (Feet): 150 Feet (+50') Assistive device: None;Straight cane (rail.) Gait Pattern/deviations: Step-through pattern;Decreased stride  length;Decreased step length - left;Drifts right/left   Gait velocity interpretation: <1.8 ft/sec, indicative of risk for recurrent falls General Gait Details: Began with rail in hallway with therapist assisting with left knee control preventing partial knee buckling and hyperextension thrust. Encouraged increased gait speed. Progressed to [facility], balance improved intermittently however a few LOB posteriorly or when distracted.   Stairs            Wheelchair Mobility    Modified Rankin (Stroke Patients Only)       Balance Overall balance assessment: Needs assistance Sitting-balance support: Feet supported;No upper extremity supported Sitting balance-Leahy Scale: Good     Standing balance support: During functional activity Standing balance-Leahy Scale: Fair                      Cognition Arousal/Alertness: Awake/alert Behavior During Therapy: WFL for tasks assessed/performed Overall Cognitive Status: Within Functional Limits for tasks assessed                      Exercises      General Comments General comments (skin integrity, edema, etc.): Lengthy discussion with family and pt about disposition. Family not happy with SNF and really wants to take patient home if safe.       Pertinent Vitals/Pain Pain Assessment: No/denies pain    Home Living                      Prior Function            PT Goals (current goals can now be found in the care plan section) Progress towards PT goals: Progressing toward goals  Frequency  Min 4X/week    PT Plan Current plan remains appropriate    Co-evaluation             End of Session Equipment Utilized During Treatment: Gait belt Activity Tolerance: Patient tolerated treatment well Patient left: in chair;with call bell/phone within reach;with chair alarm set;with family/visitor present     Time: 0315-9458 PT Time Calculation (min) (ACUTE ONLY): 39 min  Charges:  $Gait Training: 23-37  mins $Therapeutic Exercise: 8-22 mins                    G CodesCandy Sledge A 2015/01/11, 11:39 AM  Candy Sledge, PT, DPT [phone]

## 2014-12-21 NOTE — Anesthesia Postprocedure Evaluation (Signed)
  Anesthesia Post-op Note  Patient: [facility]  Procedure(s) Performed: Procedure(s): BURR HOLES (Right)  Patient Location: NICU  Anesthesia Type:General  Level of Consciousness: awake and sedated  Airway and Oxygen Therapy: Patient Spontanous Breathing  Post-op Pain: mild  Post-op Assessment: Post-op Vital signs reviewed and Patient's Cardiovascular Status Stable  Post-op Vital Signs: stable  Last Vitals:  Filed Vitals:   12/21/14 0943  BP: 139/74  Pulse: 70  Temp: 37.1 C  Resp: 18    Complications: No apparent anesthesia complications

## 2014-12-22 NOTE — Progress Notes (Signed)
Occupational Therapy Treatment Patient Details Name: Patrick Torres MRN: [id number] DOB: 1961/09/05 Today's Date: 12/22/2014    History of present illness 54 year old male with history of a generalized seizure disorder and prior traumatic brain injury presents with increasing left-sided weakness and spasticity. Patient status post significant fall and/or seizure on December 6. Head CT at that time negative. Patient presented today with increasing weakness. Follow-up head CT scan at emergency department demonstrated a very large right frontal subacute subdural hematoma with mass effect.  Pt s/p burr hole procedure 12/28.   OT comments  Pt using RW throughout his room and bathroom without LOB.  Instructed sister in use of gait belt as she is concerned pt will fall at her home. Improvement in standing tolerance at sink for 3 activities x 5 minutes after toileting.  Educated sister and pt in availability of tub transfer bench, to practice next visit. Pt is able to verbalize balance deficit, but with decreased knowledge of its implication on ADL safety.  Follow Up Recommendations  Home health OT    Equipment Recommendations  3 in 1 bedside comode (will practice tub bench transfer next visit)    Recommendations for Other Services      Precautions / Restrictions Precautions Precautions: Fall       Mobility Bed Mobility               General bed mobility comments: in chair on arrival  Transfers Overall transfer level: Needs assistance Equipment used: Rolling walker (2 wheeled) Transfers: Sit to/from Stand Sit to Stand: Supervision         General transfer comment: good technique/hand placement    Balance     Sitting balance-Leahy Scale: Good       Standing balance-Leahy Scale: Fair                     ADL Overall ADL's : Needs assistance/impaired     Grooming: Oral care;Wash/dry hands;Wash/dry face;Supervision/safety;Standing Grooming Details (indicate cue  type and reason): stood x 5 minutes without fatigue     Lower Body Bathing: Supervison/ safety;Sit to/from stand       Lower Body Dressing: Supervision/safety;Sit to/from stand Lower Body Dressing Details (indicate cue type and reason): pt able to donn and doff socks independently Toilet Transfer: Supervision/safety;RW Toilet Transfer Details (indicate cue type and reason): stood to urinate Toileting- Water quality scientist and Hygiene: Supervision/safety;Sit to/from stand Toileting - Clothing Manipulation Details (indicate cue type and reason): to manage sweat pants   Tub/Shower Transfer Details (indicate cue type and reason): educated on availability of tub transfer bench, did not practice Functional mobility during ADLs: Supervision/safety;Rolling walker General ADL Comments: Sister anxious about pt falling.  Instructed her in use of gait belt at hips for safety.      Vision                     Perception     Praxis      Cognition   Behavior During Therapy: [facility] for tasks assessed/performed Overall Cognitive Status: Impaired/Different from baseline            Safety/Judgement: Decreased awareness of safety;Decreased awareness of deficits     General Comments: Pt able to state he had poor balance, but that he could do everything for himself.    Extremity/Trunk Assessment               Exercises     Shoulder Instructions       General  Comments      Pertinent Vitals/ Pain       Pain Assessment: No/denies pain  Home Living                                          Prior Functioning/Environment              Frequency Min 2X/week     Progress Toward Goals  OT Goals(current goals can now be found in the care plan section)  Progress towards OT goals: Progressing toward goals  Acute Rehab OT Goals Patient Stated Goal: wants to improve his balance Time For Goal Achievement: 01/04/15 Potential to Achieve Goals: Good   Plan Discharge plan remains appropriate    Co-evaluation                 End of Session Equipment Utilized During Treatment: Rolling walker;Gait belt   Activity Tolerance Patient tolerated treatment well   Patient Left in chair;with call bell/phone within reach;with chair alarm set;with family/visitor present   Nurse Communication          Time: 2297-9892 OT Time Calculation (min): 49 min  Charges: OT General Charges $OT Visit: 1 Procedure OT Treatments $Self Care/Home Management : 38-52 mins  Malka So 12/22/2014, 1:17 PM  [phone]

## 2014-12-22 NOTE — Progress Notes (Signed)
Physical Therapy Treatment Patient Details Name: Patrick Torres MRN: [id number] DOB: 1961-02-21 Today's Date: 12/22/2014    History of Present Illness 54 year old male with history of a generalized seizure disorder and prior traumatic brain injury presents with increasing left-sided weakness and spasticity. Patient status post significant fall and/or seizure on December 6. Head CT at that time negative. Patient presented today with increasing weakness. Follow-up head CT scan at emergency department demonstrated a very large right frontal subacute subdural hematoma with mass effect.  Pt s/p burr hole procedure 12/28.    PT Comments    Patient used RW for ambulation today with improved balance and safety. Making gains toward goals.  Follow Up Recommendations  Home health PT;Supervision/Assistance - 24 hour     Equipment Recommendations  None recommended by PT    Recommendations for Other Services       Precautions / Restrictions Precautions Precautions: Fall Restrictions Weight Bearing Restrictions: No    Mobility  Bed Mobility               General bed mobility comments: in chair on arrival  Transfers Overall transfer level: Needs assistance Equipment used: Rolling walker (2 wheeled) Transfers: Sit to/from Stand Sit to Stand: Min guard         General transfer comment: good technique/hand placement  Ambulation/Gait Ambulation/Gait assistance: Min assist Ambulation Distance (Feet): 150 Feet Assistive device: Rolling walker (2 wheeled) Gait Pattern/deviations: Step-through pattern;Decreased stride length;Ataxic Gait velocity: Decreased Gait velocity interpretation: Below normal speed for age/gender General Gait Details: Verbal cues for safe use of RW, including turns.  Assist with ambulation for balance and safety.  Noted knees buckling x2 during gait.  Patient reports this is a chronic issue.   Stairs            Wheelchair Mobility    Modified  Rankin (Stroke Patients Only)       Balance     Sitting balance-Leahy Scale: Good     Standing balance support: Single extremity supported Standing balance-Leahy Scale: Poor                      Cognition Arousal/Alertness: Awake/alert Behavior During Therapy: WFL for tasks assessed/performed Overall Cognitive Status: Impaired/Different from baseline Area of Impairment: Safety/judgement;Awareness         Safety/Judgement: Decreased awareness of safety;Decreased awareness of deficits Awareness: Emergent   General Comments: Pt able to state he had poor balance, but that he could do everything for himself.    Exercises      General Comments        Pertinent Vitals/Pain Pain Assessment: No/denies pain    Home Living                      Prior Function            PT Goals (current goals can now be found in the care plan section) Acute Rehab PT Goals Patient Stated Goal: wants to improve his balance Progress towards PT goals: Progressing toward goals    Frequency  Min 4X/week    PT Plan Discharge plan needs to be updated    Co-evaluation             End of Session Equipment Utilized During Treatment: Gait belt Activity Tolerance: Patient limited by fatigue Patient left: in chair;with call bell/phone within reach;with chair alarm set     Time: 1401-1414 PT Time Calculation (min) (ACUTE ONLY): 13 min  Charges:  $Gait  Training: 8-22 mins                    G Codes:      Patrick Torres January 08, 2015, 2:22 PM Patrick Torres. Patrick Torres, [place] Pager [phone]

## 2014-12-22 NOTE — Progress Notes (Signed)
Patient ID: Patrick Torres, male   DOB: [birth year], 54 y.o.   MRN: [id number] Subjective:  The patient is alert and pleasant and in no apparent distress.  Objective: Vital signs in last 24 hours: Temp:  [98.3 F (36.8 C)-98.6 F (37 C)] 98.4 F (36.9 C) (01/01 0500) Pulse Rate:  [64-83] 83 (01/01 0500) Resp:  [18-19] 19 (01/01 0500) BP: (111-148)/(60-79) 126/79 mmHg (01/01 0500) SpO2:  [94 %-98 %] 98 % (01/01 0500)  Intake/Output from previous day: 12/31 0701 - 01/01 0700 In: 120 [P.O.:120] Out: 100 [Urine:100] Intake/Output this shift:    Physical exam the patient is alert and oriented. His mentation is slow. He is moving all 4 extremities well. His wounds are healing well without signs of infection.  Lab Results: No results for input(s): WBC, HGB, HCT, PLT in the last 72 hours. BMET No results for input(s): NA, K, CL, CO2, GLUCOSE, BUN, CREATININE, CALCIUM in the last 72 hours.  Studies/Results: No results found.  Assessment/Plan: Postop day #4: We will continue to mobilize the patient with PT.  LOS: 4 days     Layken Beg D 12/22/2014, 10:29 AM

## 2014-12-23 NOTE — Progress Notes (Signed)
Patient ID: Patrick Torres, male   DOB: 1961/08/01, 54 y.o.   MRN: [id number] Patient doing well minimal headache  Awake alert oriented strength 5 out of 5 with no pronator drift incisions clean dry and intact  Continue physical therapy probable discharge on Monday

## 2014-12-23 NOTE — Progress Notes (Signed)
Physical Therapy Treatment Patient Details Name: Patrick Torres MRN: [id number] DOB: May 16, 1961 Today's Date: 12/23/2014    History of Present Illness 54 year old male with history of a generalized seizure disorder and prior traumatic brain injury presents with increasing left-sided weakness and spasticity. Patient status post significant fall and/or seizure on December 6. Head CT at that time negative. Patient presented today with increasing weakness. Follow-up head CT scan at emergency department demonstrated a very large right frontal subacute subdural hematoma with mass effect.  Pt s/p burr hole procedure 12/28.    PT Comments    Pt progressing with therapy. Able to mobilize at supervision level. Lt LE buckling at end of session secondary to fatigue. Sister present for session.   Follow Up Recommendations  Home health PT;Supervision/Assistance - 24 hour     Equipment Recommendations  None recommended by PT    Recommendations for Other Services       Precautions / Restrictions Precautions Precautions: Fall Precaution Comments: hx of seizures Restrictions Weight Bearing Restrictions: No    Mobility  Bed Mobility               General bed mobility comments: in chair  Transfers Overall transfer level: Needs assistance Equipment used: Rolling walker (2 wheeled) Transfers: Sit to/from Stand Sit to Stand: Supervision         General transfer comment: cues for technique and RW safety  Ambulation/Gait Ambulation/Gait assistance: Supervision Ambulation Distance (Feet): 180 Feet Assistive device: Rolling walker (2 wheeled) Gait Pattern/deviations: Step-through pattern;Decreased dorsiflexion - left (Lt LE ER) Gait velocity: Decreased Gait velocity interpretation: Below normal speed for age/gender General Gait Details: cues for technique with RW; Lt LE buckling at end of session due to fatigue    Stairs            Wheelchair Mobility    Modified Rankin  (Stroke Patients Only)       Balance Overall balance assessment: Needs assistance Sitting-balance support: Feet supported;Single extremity supported;Bilateral upper extremity supported Sitting balance-Leahy Scale: Good     Standing balance support: During functional activity;Bilateral upper extremity supported;Single extremity supported Standing balance-Leahy Scale: Poor Standing balance comment: pt using UE support at all times while performing ADLs in bathroom                    Cognition Arousal/Alertness: Awake/alert Behavior During Therapy: Impulsive Overall Cognitive Status: Impaired/Different from baseline Area of Impairment: Safety/judgement;Awareness         Safety/Judgement: Decreased awareness of safety;Decreased awareness of deficits     General Comments: pt impulsive at times; attempting to stand without RW in position     Exercises      General Comments General comments (skin integrity, edema, etc.): sister present with multiple questions regarding DME, home setup, educated pt and sister, all questions answered      Pertinent Vitals/Pain Pain Assessment: No/denies pain    Home Living                      Prior Function            PT Goals (current goals can now be found in the care plan section) Acute Rehab PT Goals PT Goal Formulation: With patient/family Time For Goal Achievement: 12/27/14 Potential to Achieve Goals: Good Progress towards PT goals: Progressing toward goals    Frequency  Min 4X/week    PT Plan Current plan remains appropriate    Co-evaluation  End of Session Equipment Utilized During Treatment: Gait belt Activity Tolerance: Patient tolerated treatment well Patient left: in chair;with call bell/phone within reach;with chair alarm set;with family/visitor present     Time: 9688-6484 PT Time Calculation (min) (ACUTE ONLY): 31 min  Charges:  [employer] Training: 23-37 mins                     G CodesGustavus Bryant, [place] 12/23/2014, 11:17 AM

## 2014-12-23 NOTE — Progress Notes (Signed)
Occupational Therapy Treatment Patient Details Name: Patrick Torres MRN: [id number] DOB: [birth year] Today's Date: 12/23/2014    History of present illness 54 year old male with history of a generalized seizure disorder and prior traumatic brain injury presents with increasing left-sided weakness and spasticity. Patient status post significant fall and/or seizure on December 6. Head CT at that time negative. Patient presented today with increasing weakness. Follow-up head CT scan at emergency department demonstrated a very large right frontal subacute subdural hematoma with mass effect.  Pt s/p burr hole procedure 12/28.   OT comments  Seen this pm with family for education. Pt improving and feel he is appropriate for D/C home with 24/7 S of family when medically stable. Family able to return demonstrate appropriate management during functional mobility @ RW level. Pt will need tub bench and 3 in 1 for home D/C. Pt will benefit from [facility] after D/C. Will follow acutely to facilitate safe D/C home.   Follow Up Recommendations  Home health OT;Supervision/Assistance - 24 hour    Equipment Recommendations  Tub/shower bench;3 in 1 bedside comode    Recommendations for Other Services      Precautions / Restrictions Precautions Precautions: Fall       Mobility Bed Mobility               General bed mobility comments: in chair  Transfers Overall transfer level: Needs assistance Equipment used: Rolling walker (2 wheeled) Transfers: Sit to/from [facility] Sit to Stand: Supervision Stand pivot transfers: Supervision            Balance             Standing balance-Leahy Scale: Poor                     ADL Overall ADL's : Needs assistance/impaired                                 Tub/ Shower Transfer: Min guard;Ambulation;Tub bench Tub/Shower Transfer Details (indicate cue type and reason): Completed tub bench trasnfer with sister and  nephew  Functional mobility during ADLs: Min guard;Rolling walker (due to L knee buckling earlier with PT) General ADL Comments: Educated sister to walk on pt's L side  Discussed importance of minimizing distractions during mobility to improve attention to task. Family verbalized understanding.  Sister asking about using a cane. Educated sister that the [place] would progress him with the AD.       Vision                     Perception     Praxis      Cognition   Behavior During Therapy: [name] for tasks assessed/performed Overall Cognitive Status: Impaired/Different from baseline Area of Impairment: Safety/judgement;Awareness     Memory: Decreased recall of precautions    Safety/Judgement: Decreased awareness of safety;Decreased awareness of deficits Awareness: Emergent        Extremity/Trunk Assessment               Exercises     Shoulder Instructions       General Comments      Pertinent Vitals/ Pain       Pain Assessment: No/denies pain  Home Living  Prior Functioning/Environment              Frequency Min 2X/week     Progress Toward Goals  OT Goals(current goals can now be found in the care plan section)  Progress towards OT goals: Progressing toward goals  Acute Rehab OT Goals Patient Stated Goal: wants to improve his balance OT Goal Formulation: With patient Time For Goal Achievement: 01/04/15 Potential to Achieve Goals: Good ADL Goals Pt Will Perform Grooming: with min guard assist;standing Pt Will Perform Upper Body Bathing: with supervision;standing;sitting Pt Will Perform Lower Body Bathing: with supervision;sit to/from stand Pt Will Transfer to Toilet: with supervision;bedside commode;ambulating Pt Will Perform Tub/Shower Transfer: Tub transfer;with supervision;ambulating;rolling walker Additional ADL Goal #1: Pt will complete adl at sink level with no LOB   Plan  Discharge plan remains appropriate    Co-evaluation                 End of Session Equipment Utilized During Treatment: Gait belt;Rolling walker   Activity Tolerance Patient tolerated treatment well   Patient Left in chair;with call bell/phone within reach;with chair alarm set;with family/visitor present   Nurse Communication Mobility status;Precautions        Time: 9643-8381 OT Time Calculation (min): 28 min  Charges: OT General Charges $OT Visit: 1 Procedure OT Treatments $Self Care/Home Management : 23-37 mins  Roshanda Balazs,HILLARY 12/23/2014, 3:21 PM   [facility], OTR/L  [phone] 12/23/2014

## 2014-12-23 NOTE — Progress Notes (Signed)
OT Cancellation Note  Patient Details Name: Patrick Torres MRN: [id number] DOB: [birth year]   Cancelled Treatment:    Reason Eval/Treat Not Completed: Other (comment) Attempted to see pt for family education. Family not in room. Will return @ 2:15 to work with pt's sister.  [place], OTR/L  [phone] 12/23/2014 12/23/2014, 11:38 AM

## 2014-12-24 NOTE — Progress Notes (Signed)
Doing well. No headache. No NTW. Incision CDI, moves all ext well, awake and alert. Hopefully home tomorrow.

## 2014-12-25 MED ORDER — HYDROCODONE-ACETAMINOPHEN 5-325 MG PO TABS: 1.0000 | ORAL_TABLET | ORAL | Status: DC | PRN

## 2014-12-25 NOTE — Progress Notes (Signed)
Talked to patient about DCP/ [place] choices, patient requested [facility] for HHPT/ OT; DME ordered - pt requested rolling walker and 3:1; [facility] called for arrangements; Aneta Mins [phone]

## 2014-12-25 NOTE — Discharge Summary (Signed)
Physician Discharge Summary  Patient ID: Patrick Torres MRN: [id number] DOB/AGE: May 11, 1961 54 y.o.  Admit date: 12/18/2014 Discharge date: 12/25/2014  Admission Diagnoses: Right subdural hematoma  Discharge Diagnoses: The same Active Problems:   Subdural hemorrhage   Subdural hematoma   Discharged Condition: good  Hospital Course: Dr. Annette Stable performed a right burr hole for evacuation of subdural hematoma on the patient on 12/18/2014.  The patient's postoperative course was unremarkable. On postoperative day #7 the patient requested discharge to home. He was given oral and written discharge instructions. All his questions were answered.  Consults: None Significant Diagnostic Studies: Head CT Treatments: Right burr hole for subdural hematoma Discharge Exam: Blood pressure 127/65, pulse 55, temperature 97.4 F (36.3 C), temperature source Oral, resp. rate 20, height 5\' 8"  (1.727 m), weight 95.6 kg (210 lb 12.2 oz), SpO2 99 %. The patient is alert and oriented 3. His wounds are healing well. His speech is normal. His strength is normal.  Disposition: Home  Discharge Instructions    Call MD for:  difficulty breathing, headache or visual disturbances    Complete by:  As directed      Call MD for:  extreme fatigue    Complete by:  As directed      Call MD for:  hives    Complete by:  As directed      Call MD for:  persistant dizziness or light-headedness    Complete by:  As directed      Call MD for:  persistant nausea and vomiting    Complete by:  As directed      Call MD for:  redness, tenderness, or signs of infection (pain, swelling, redness, odor or green/yellow discharge around incision site)    Complete by:  As directed      Call MD for:  severe uncontrolled pain    Complete by:  As directed      Call MD for:  temperature >100.4    Complete by:  As directed      Diet - low sodium heart healthy    Complete by:  As directed      Discharge instructions    Complete by:   As directed   Call [phone] for a followup appointment. Take a stool softener while you are using pain medications.     Driving Restrictions    Complete by:  As directed   Do not drive for 2 weeks.     Increase activity slowly    Complete by:  As directed      Lifting restrictions    Complete by:  As directed   Do not lift more than 5 pounds. No excessive bending or twisting.     May shower / Bathe    Complete by:  As directed   He may shower after the pain she is removed 3 days after surgery. Leave the incision alone.     No dressing needed    Complete by:  As directed             Medication List    STOP taking these medications        naproxen 500 MG tablet  Commonly known as:  NAPROSYN     naproxen sodium 220 MG tablet  Commonly known as:  ANAPROX      TAKE these medications        carbamazepine 400 MG 12 hr tablet  Commonly known as:  TEGRETOL XR  Take 1 tablet (400 mg total)  by mouth 2 (two) times daily.     HYDROcodone-acetaminophen 5-325 MG per tablet  Commonly known as:  NORCO/VICODIN  Take 1 tablet by mouth every 4 (four) hours as needed for moderate pain.     levETIRAcetam 1000 MG tablet  Commonly known as:  KEPPRA  Take 1 tablet (1,000 mg total) by mouth 2 (two) times daily.         SignedOphelia Charter 12/25/2014, 1:24 PM

## 2014-12-25 NOTE — Progress Notes (Signed)
Physical Therapy Treatment Patient Details Name: Marciano Mundt MRN: [id number] DOB: 02/14/1961 Today's Date: 12/25/2014    History of Present Illness 54 year old male with history of a generalized seizure disorder and prior traumatic brain injury presents with increasing left-sided weakness and spasticity. Patient status post significant fall and/or seizure on December 6. Head CT at that time negative. Patient presented today with increasing weakness. Follow-up head CT scan at emergency department demonstrated a very large right frontal subacute subdural hematoma with mass effect.  Pt s/p burr hole procedure 12/28.    PT Comments    Patient progressing well with mobility. Continues to require cues for safety when using RW (stop if needing to take hands off walker handles). Will be staying with family members at discharge to have 24/7 supervision. Tolerated stair negotiation with Min guard for safety. Will continue to follow acutely.   Follow Up Recommendations  Home health PT;Supervision/Assistance - 24 hour     Equipment Recommendations  None recommended by PT    Recommendations for Other Services       Precautions / Restrictions Precautions Precautions: Fall Precaution Comments: hx of seizures Restrictions Weight Bearing Restrictions: No    Mobility  Bed Mobility               General bed mobility comments: received sitting in chair upon PT arrival.  Transfers Overall transfer level: Needs assistance Equipment used: Rolling walker (2 wheeled) Transfers: Sit to/from Stand Sit to Stand: Supervision         General transfer comment: Supervision for safety. Stood from Youth worker, from bench x2.  Ambulation/Gait Ambulation/Gait assistance: Supervision Ambulation Distance (Feet): 180 Feet Assistive device: Rolling walker (2 wheeled) Gait Pattern/deviations: Step-through pattern;Decreased dorsiflexion - left (Lft LLE ER) Gait velocity: .67 ft/sec Gait velocity  interpretation: <1.8 ft/sec, indicative of risk for recurrent falls General Gait Details: VC's for RW safety as pt with tendency to take UEs off walker during gait. Partial knee buckling LLE towards end of gait due to fatigue   Stairs Stairs: Yes Stairs assistance: Min guard Stair Management: Two rails Number of Stairs: 3 (+ 2 steps x2 bouts) General stair comments: Min guard for safety. Cues for technique.  Wheelchair Mobility    Modified Rankin (Stroke Patients Only)       Balance Overall balance assessment: Needs assistance Sitting-balance support: Feet supported;No upper extremity supported Sitting balance-Leahy Scale: Good     Standing balance support: During functional activity Standing balance-Leahy Scale: Poor                      Cognition Arousal/Alertness: Awake/alert Behavior During Therapy: WFL for tasks assessed/performed Overall Cognitive Status: Impaired/Different from baseline Area of Impairment: Safety/judgement;Awareness         Safety/Judgement: Decreased awareness of safety;Decreased awareness of deficits     General Comments: pt impulsive at times; attempting to stand without RW in position     Exercises      General Comments        Pertinent Vitals/Pain Pain Assessment: No/denies pain    Home Living                      Prior Function            PT Goals (current goals can now be found in the care plan section) Progress towards PT goals: Progressing toward goals    Frequency  Min 4X/week    PT Plan Current plan remains appropriate  Co-evaluation             End of Session Equipment Utilized During Treatment: Gait belt Activity Tolerance: Patient tolerated treatment well Patient left: in chair;with call bell/phone within reach;with chair alarm set     Time: 1287-8676 PT Time Calculation (min) (ACUTE ONLY): 27 min  Charges:  $Gait Training: 23-37 mins                    G CodesCandy Sledge A 2014-12-26, 12:34 PM  Candy Sledge, [place], DPT [phone]

## 2014-12-25 NOTE — Progress Notes (Signed)
Discharge orders received, pt for discharge home today with home health PT per [place], DME to be delivered to the home today, IV D/C with incision CDI to R scalp.  D/C instructions and Rx given with verbalized understanding.  Family at bedside to assist pt with discharge. Staff brought pt downstairs via wheelchair.

## 2014-12-25 NOTE — Progress Notes (Signed)
Patient's insurance provider is [employer]- they contract with [employer] for DME; orders faxed for rolling walker and 3:1 to [employer] - they will deliver the equipment to the patient's home this afternoon; B [employer]

## 2014-12-26 DIAGNOSIS — S065X0S Traumatic subdural hemorrhage without loss of consciousness, sequela: Secondary | ICD-10-CM | POA: Diagnosis not present

## 2014-12-26 DIAGNOSIS — G40909 Epilepsy, unspecified, not intractable, without status epilepticus: Secondary | ICD-10-CM | POA: Diagnosis not present

## 2014-12-26 DIAGNOSIS — R1310 Dysphagia, unspecified: Secondary | ICD-10-CM | POA: Diagnosis not present

## 2014-12-26 DIAGNOSIS — R260 Ataxic gait: Secondary | ICD-10-CM | POA: Diagnosis not present

## 2014-12-26 DIAGNOSIS — G8194 Hemiplegia, unspecified affecting left nondominant side: Secondary | ICD-10-CM | POA: Diagnosis not present

## 2014-12-26 DIAGNOSIS — R41841 Cognitive communication deficit: Secondary | ICD-10-CM | POA: Diagnosis not present

## 2014-12-27 DIAGNOSIS — I6200 Nontraumatic subdural hemorrhage, unspecified: Secondary | ICD-10-CM | POA: Diagnosis not present

## 2014-12-28 DIAGNOSIS — S065X0S Traumatic subdural hemorrhage without loss of consciousness, sequela: Secondary | ICD-10-CM | POA: Diagnosis not present

## 2014-12-28 DIAGNOSIS — G40909 Epilepsy, unspecified, not intractable, without status epilepticus: Secondary | ICD-10-CM | POA: Diagnosis not present

## 2014-12-28 DIAGNOSIS — R41841 Cognitive communication deficit: Secondary | ICD-10-CM | POA: Diagnosis not present

## 2014-12-28 DIAGNOSIS — R260 Ataxic gait: Secondary | ICD-10-CM | POA: Diagnosis not present

## 2014-12-28 DIAGNOSIS — R1310 Dysphagia, unspecified: Secondary | ICD-10-CM | POA: Diagnosis not present

## 2014-12-28 DIAGNOSIS — G8194 Hemiplegia, unspecified affecting left nondominant side: Secondary | ICD-10-CM | POA: Diagnosis not present

## 2014-12-29 DIAGNOSIS — S065X0S Traumatic subdural hemorrhage without loss of consciousness, sequela: Secondary | ICD-10-CM | POA: Diagnosis not present

## 2014-12-29 DIAGNOSIS — R41841 Cognitive communication deficit: Secondary | ICD-10-CM | POA: Diagnosis not present

## 2014-12-29 DIAGNOSIS — G40909 Epilepsy, unspecified, not intractable, without status epilepticus: Secondary | ICD-10-CM | POA: Diagnosis not present

## 2014-12-29 DIAGNOSIS — R1310 Dysphagia, unspecified: Secondary | ICD-10-CM | POA: Diagnosis not present

## 2014-12-29 DIAGNOSIS — G8194 Hemiplegia, unspecified affecting left nondominant side: Secondary | ICD-10-CM | POA: Diagnosis not present

## 2014-12-29 DIAGNOSIS — R260 Ataxic gait: Secondary | ICD-10-CM | POA: Diagnosis not present

## 2015-01-01 DIAGNOSIS — S065X0S Traumatic subdural hemorrhage without loss of consciousness, sequela: Secondary | ICD-10-CM | POA: Diagnosis not present

## 2015-01-01 DIAGNOSIS — G8194 Hemiplegia, unspecified affecting left nondominant side: Secondary | ICD-10-CM | POA: Diagnosis not present

## 2015-01-01 DIAGNOSIS — R260 Ataxic gait: Secondary | ICD-10-CM | POA: Diagnosis not present

## 2015-01-01 DIAGNOSIS — R41841 Cognitive communication deficit: Secondary | ICD-10-CM | POA: Diagnosis not present

## 2015-01-01 DIAGNOSIS — G40909 Epilepsy, unspecified, not intractable, without status epilepticus: Secondary | ICD-10-CM | POA: Diagnosis not present

## 2015-01-01 DIAGNOSIS — R1310 Dysphagia, unspecified: Secondary | ICD-10-CM | POA: Diagnosis not present

## 2015-01-02 DIAGNOSIS — G8194 Hemiplegia, unspecified affecting left nondominant side: Secondary | ICD-10-CM | POA: Diagnosis not present

## 2015-01-02 DIAGNOSIS — R1310 Dysphagia, unspecified: Secondary | ICD-10-CM | POA: Diagnosis not present

## 2015-01-02 DIAGNOSIS — S065X0S Traumatic subdural hemorrhage without loss of consciousness, sequela: Secondary | ICD-10-CM | POA: Diagnosis not present

## 2015-01-02 DIAGNOSIS — R260 Ataxic gait: Secondary | ICD-10-CM | POA: Diagnosis not present

## 2015-01-02 DIAGNOSIS — R41841 Cognitive communication deficit: Secondary | ICD-10-CM | POA: Diagnosis not present

## 2015-01-02 DIAGNOSIS — G40909 Epilepsy, unspecified, not intractable, without status epilepticus: Secondary | ICD-10-CM | POA: Diagnosis not present

## 2015-01-03 DIAGNOSIS — R41841 Cognitive communication deficit: Secondary | ICD-10-CM | POA: Diagnosis not present

## 2015-01-03 DIAGNOSIS — R260 Ataxic gait: Secondary | ICD-10-CM | POA: Diagnosis not present

## 2015-01-03 DIAGNOSIS — G8194 Hemiplegia, unspecified affecting left nondominant side: Secondary | ICD-10-CM | POA: Diagnosis not present

## 2015-01-03 DIAGNOSIS — R1310 Dysphagia, unspecified: Secondary | ICD-10-CM | POA: Diagnosis not present

## 2015-01-03 DIAGNOSIS — G40909 Epilepsy, unspecified, not intractable, without status epilepticus: Secondary | ICD-10-CM | POA: Diagnosis not present

## 2015-01-03 DIAGNOSIS — S065X0S Traumatic subdural hemorrhage without loss of consciousness, sequela: Secondary | ICD-10-CM | POA: Diagnosis not present

## 2015-01-05 DIAGNOSIS — R260 Ataxic gait: Secondary | ICD-10-CM | POA: Diagnosis not present

## 2015-01-05 DIAGNOSIS — G40909 Epilepsy, unspecified, not intractable, without status epilepticus: Secondary | ICD-10-CM | POA: Diagnosis not present

## 2015-01-05 DIAGNOSIS — R41841 Cognitive communication deficit: Secondary | ICD-10-CM | POA: Diagnosis not present

## 2015-01-05 DIAGNOSIS — R1310 Dysphagia, unspecified: Secondary | ICD-10-CM | POA: Diagnosis not present

## 2015-01-05 DIAGNOSIS — S065X0S Traumatic subdural hemorrhage without loss of consciousness, sequela: Secondary | ICD-10-CM | POA: Diagnosis not present

## 2015-01-05 DIAGNOSIS — G8194 Hemiplegia, unspecified affecting left nondominant side: Secondary | ICD-10-CM | POA: Diagnosis not present

## 2015-01-08 DIAGNOSIS — S065X0S Traumatic subdural hemorrhage without loss of consciousness, sequela: Secondary | ICD-10-CM | POA: Diagnosis not present

## 2015-01-08 DIAGNOSIS — R260 Ataxic gait: Secondary | ICD-10-CM | POA: Diagnosis not present

## 2015-01-08 DIAGNOSIS — R1310 Dysphagia, unspecified: Secondary | ICD-10-CM | POA: Diagnosis not present

## 2015-01-08 DIAGNOSIS — R41841 Cognitive communication deficit: Secondary | ICD-10-CM | POA: Diagnosis not present

## 2015-01-08 DIAGNOSIS — G40909 Epilepsy, unspecified, not intractable, without status epilepticus: Secondary | ICD-10-CM | POA: Diagnosis not present

## 2015-01-08 DIAGNOSIS — G8194 Hemiplegia, unspecified affecting left nondominant side: Secondary | ICD-10-CM | POA: Diagnosis not present

## 2015-01-09 DIAGNOSIS — R1310 Dysphagia, unspecified: Secondary | ICD-10-CM | POA: Diagnosis not present

## 2015-01-09 DIAGNOSIS — R260 Ataxic gait: Secondary | ICD-10-CM | POA: Diagnosis not present

## 2015-01-09 DIAGNOSIS — R41841 Cognitive communication deficit: Secondary | ICD-10-CM | POA: Diagnosis not present

## 2015-01-09 DIAGNOSIS — G40909 Epilepsy, unspecified, not intractable, without status epilepticus: Secondary | ICD-10-CM | POA: Diagnosis not present

## 2015-01-09 DIAGNOSIS — S065X0S Traumatic subdural hemorrhage without loss of consciousness, sequela: Secondary | ICD-10-CM | POA: Diagnosis not present

## 2015-01-09 DIAGNOSIS — G8194 Hemiplegia, unspecified affecting left nondominant side: Secondary | ICD-10-CM | POA: Diagnosis not present

## 2015-01-10 DIAGNOSIS — R41841 Cognitive communication deficit: Secondary | ICD-10-CM | POA: Diagnosis not present

## 2015-01-10 DIAGNOSIS — R1310 Dysphagia, unspecified: Secondary | ICD-10-CM | POA: Diagnosis not present

## 2015-01-10 DIAGNOSIS — S065X0S Traumatic subdural hemorrhage without loss of consciousness, sequela: Secondary | ICD-10-CM | POA: Diagnosis not present

## 2015-01-10 DIAGNOSIS — R260 Ataxic gait: Secondary | ICD-10-CM | POA: Diagnosis not present

## 2015-01-10 DIAGNOSIS — G8194 Hemiplegia, unspecified affecting left nondominant side: Secondary | ICD-10-CM | POA: Diagnosis not present

## 2015-01-10 DIAGNOSIS — G40909 Epilepsy, unspecified, not intractable, without status epilepticus: Secondary | ICD-10-CM | POA: Diagnosis not present

## 2015-01-11 DIAGNOSIS — R260 Ataxic gait: Secondary | ICD-10-CM | POA: Diagnosis not present

## 2015-01-11 DIAGNOSIS — G40909 Epilepsy, unspecified, not intractable, without status epilepticus: Secondary | ICD-10-CM | POA: Diagnosis not present

## 2015-01-11 DIAGNOSIS — S065X0S Traumatic subdural hemorrhage without loss of consciousness, sequela: Secondary | ICD-10-CM | POA: Diagnosis not present

## 2015-01-11 DIAGNOSIS — G8194 Hemiplegia, unspecified affecting left nondominant side: Secondary | ICD-10-CM | POA: Diagnosis not present

## 2015-01-11 DIAGNOSIS — R1310 Dysphagia, unspecified: Secondary | ICD-10-CM | POA: Diagnosis not present

## 2015-01-11 DIAGNOSIS — R41841 Cognitive communication deficit: Secondary | ICD-10-CM | POA: Diagnosis not present

## 2015-01-15 DIAGNOSIS — S065X0S Traumatic subdural hemorrhage without loss of consciousness, sequela: Secondary | ICD-10-CM | POA: Diagnosis not present

## 2015-01-15 DIAGNOSIS — G40909 Epilepsy, unspecified, not intractable, without status epilepticus: Secondary | ICD-10-CM | POA: Diagnosis not present

## 2015-01-15 DIAGNOSIS — R41841 Cognitive communication deficit: Secondary | ICD-10-CM | POA: Diagnosis not present

## 2015-01-15 DIAGNOSIS — R260 Ataxic gait: Secondary | ICD-10-CM | POA: Diagnosis not present

## 2015-01-15 DIAGNOSIS — R1310 Dysphagia, unspecified: Secondary | ICD-10-CM | POA: Diagnosis not present

## 2015-01-15 DIAGNOSIS — G8194 Hemiplegia, unspecified affecting left nondominant side: Secondary | ICD-10-CM | POA: Diagnosis not present

## 2015-01-16 DIAGNOSIS — G40909 Epilepsy, unspecified, not intractable, without status epilepticus: Secondary | ICD-10-CM | POA: Diagnosis not present

## 2015-01-16 DIAGNOSIS — G8194 Hemiplegia, unspecified affecting left nondominant side: Secondary | ICD-10-CM | POA: Diagnosis not present

## 2015-01-16 DIAGNOSIS — R1310 Dysphagia, unspecified: Secondary | ICD-10-CM | POA: Diagnosis not present

## 2015-01-16 DIAGNOSIS — S065X0S Traumatic subdural hemorrhage without loss of consciousness, sequela: Secondary | ICD-10-CM | POA: Diagnosis not present

## 2015-01-16 DIAGNOSIS — R260 Ataxic gait: Secondary | ICD-10-CM | POA: Diagnosis not present

## 2015-01-16 DIAGNOSIS — R41841 Cognitive communication deficit: Secondary | ICD-10-CM | POA: Diagnosis not present

## 2015-01-17 DIAGNOSIS — R260 Ataxic gait: Secondary | ICD-10-CM | POA: Diagnosis not present

## 2015-01-17 DIAGNOSIS — R41841 Cognitive communication deficit: Secondary | ICD-10-CM | POA: Diagnosis not present

## 2015-01-17 DIAGNOSIS — G40909 Epilepsy, unspecified, not intractable, without status epilepticus: Secondary | ICD-10-CM | POA: Diagnosis not present

## 2015-01-17 DIAGNOSIS — R1310 Dysphagia, unspecified: Secondary | ICD-10-CM | POA: Diagnosis not present

## 2015-01-17 DIAGNOSIS — S065X0S Traumatic subdural hemorrhage without loss of consciousness, sequela: Secondary | ICD-10-CM | POA: Diagnosis not present

## 2015-01-17 DIAGNOSIS — G8194 Hemiplegia, unspecified affecting left nondominant side: Secondary | ICD-10-CM | POA: Diagnosis not present

## 2015-01-18 DIAGNOSIS — G40909 Epilepsy, unspecified, not intractable, without status epilepticus: Secondary | ICD-10-CM | POA: Diagnosis not present

## 2015-01-18 DIAGNOSIS — G8194 Hemiplegia, unspecified affecting left nondominant side: Secondary | ICD-10-CM | POA: Diagnosis not present

## 2015-01-18 DIAGNOSIS — S065X0S Traumatic subdural hemorrhage without loss of consciousness, sequela: Secondary | ICD-10-CM | POA: Diagnosis not present

## 2015-01-18 DIAGNOSIS — R1310 Dysphagia, unspecified: Secondary | ICD-10-CM | POA: Diagnosis not present

## 2015-01-18 DIAGNOSIS — R41841 Cognitive communication deficit: Secondary | ICD-10-CM | POA: Diagnosis not present

## 2015-01-18 DIAGNOSIS — R260 Ataxic gait: Secondary | ICD-10-CM | POA: Diagnosis not present

## 2015-01-22 DIAGNOSIS — G40909 Epilepsy, unspecified, not intractable, without status epilepticus: Secondary | ICD-10-CM | POA: Diagnosis not present

## 2015-01-22 DIAGNOSIS — S065X0S Traumatic subdural hemorrhage without loss of consciousness, sequela: Secondary | ICD-10-CM | POA: Diagnosis not present

## 2015-01-22 DIAGNOSIS — R260 Ataxic gait: Secondary | ICD-10-CM | POA: Diagnosis not present

## 2015-01-22 DIAGNOSIS — G8194 Hemiplegia, unspecified affecting left nondominant side: Secondary | ICD-10-CM | POA: Diagnosis not present

## 2015-01-22 DIAGNOSIS — R41841 Cognitive communication deficit: Secondary | ICD-10-CM | POA: Diagnosis not present

## 2015-01-22 DIAGNOSIS — R1310 Dysphagia, unspecified: Secondary | ICD-10-CM | POA: Diagnosis not present

## 2015-01-23 DIAGNOSIS — R260 Ataxic gait: Secondary | ICD-10-CM | POA: Diagnosis not present

## 2015-01-23 DIAGNOSIS — S065X0S Traumatic subdural hemorrhage without loss of consciousness, sequela: Secondary | ICD-10-CM | POA: Diagnosis not present

## 2015-01-23 DIAGNOSIS — G8194 Hemiplegia, unspecified affecting left nondominant side: Secondary | ICD-10-CM | POA: Diagnosis not present

## 2015-01-23 DIAGNOSIS — R41841 Cognitive communication deficit: Secondary | ICD-10-CM | POA: Diagnosis not present

## 2015-01-23 DIAGNOSIS — G40909 Epilepsy, unspecified, not intractable, without status epilepticus: Secondary | ICD-10-CM | POA: Diagnosis not present

## 2015-01-23 DIAGNOSIS — R1310 Dysphagia, unspecified: Secondary | ICD-10-CM | POA: Diagnosis not present

## 2015-01-24 DIAGNOSIS — R260 Ataxic gait: Secondary | ICD-10-CM | POA: Diagnosis not present

## 2015-01-24 DIAGNOSIS — G40909 Epilepsy, unspecified, not intractable, without status epilepticus: Secondary | ICD-10-CM | POA: Diagnosis not present

## 2015-01-24 DIAGNOSIS — R1310 Dysphagia, unspecified: Secondary | ICD-10-CM | POA: Diagnosis not present

## 2015-01-24 DIAGNOSIS — R41841 Cognitive communication deficit: Secondary | ICD-10-CM | POA: Diagnosis not present

## 2015-01-24 DIAGNOSIS — G8194 Hemiplegia, unspecified affecting left nondominant side: Secondary | ICD-10-CM | POA: Diagnosis not present

## 2015-01-24 DIAGNOSIS — S065X0S Traumatic subdural hemorrhage without loss of consciousness, sequela: Secondary | ICD-10-CM | POA: Diagnosis not present

## 2015-01-25 DIAGNOSIS — R260 Ataxic gait: Secondary | ICD-10-CM | POA: Diagnosis not present

## 2015-01-25 DIAGNOSIS — G8194 Hemiplegia, unspecified affecting left nondominant side: Secondary | ICD-10-CM | POA: Diagnosis not present

## 2015-01-25 DIAGNOSIS — S065X0S Traumatic subdural hemorrhage without loss of consciousness, sequela: Secondary | ICD-10-CM | POA: Diagnosis not present

## 2015-01-25 DIAGNOSIS — R1310 Dysphagia, unspecified: Secondary | ICD-10-CM | POA: Diagnosis not present

## 2015-01-25 DIAGNOSIS — R41841 Cognitive communication deficit: Secondary | ICD-10-CM | POA: Diagnosis not present

## 2015-01-25 DIAGNOSIS — G40909 Epilepsy, unspecified, not intractable, without status epilepticus: Secondary | ICD-10-CM | POA: Diagnosis not present

## 2015-01-30 DIAGNOSIS — R41841 Cognitive communication deficit: Secondary | ICD-10-CM | POA: Diagnosis not present

## 2015-01-30 DIAGNOSIS — G8194 Hemiplegia, unspecified affecting left nondominant side: Secondary | ICD-10-CM | POA: Diagnosis not present

## 2015-01-30 DIAGNOSIS — S065X0S Traumatic subdural hemorrhage without loss of consciousness, sequela: Secondary | ICD-10-CM | POA: Diagnosis not present

## 2015-01-30 DIAGNOSIS — R260 Ataxic gait: Secondary | ICD-10-CM | POA: Diagnosis not present

## 2015-01-30 DIAGNOSIS — R1310 Dysphagia, unspecified: Secondary | ICD-10-CM | POA: Diagnosis not present

## 2015-01-30 DIAGNOSIS — G40909 Epilepsy, unspecified, not intractable, without status epilepticus: Secondary | ICD-10-CM | POA: Diagnosis not present

## 2015-01-31 DIAGNOSIS — R1310 Dysphagia, unspecified: Secondary | ICD-10-CM | POA: Diagnosis not present

## 2015-01-31 DIAGNOSIS — R6889 Other general symptoms and signs: Secondary | ICD-10-CM | POA: Diagnosis not present

## 2015-01-31 DIAGNOSIS — R41841 Cognitive communication deficit: Secondary | ICD-10-CM | POA: Diagnosis not present

## 2015-01-31 DIAGNOSIS — R260 Ataxic gait: Secondary | ICD-10-CM | POA: Diagnosis not present

## 2015-01-31 DIAGNOSIS — G40909 Epilepsy, unspecified, not intractable, without status epilepticus: Secondary | ICD-10-CM | POA: Diagnosis not present

## 2015-01-31 DIAGNOSIS — S065X0S Traumatic subdural hemorrhage without loss of consciousness, sequela: Secondary | ICD-10-CM | POA: Diagnosis not present

## 2015-01-31 DIAGNOSIS — G8194 Hemiplegia, unspecified affecting left nondominant side: Secondary | ICD-10-CM | POA: Diagnosis not present

## 2015-02-01 DIAGNOSIS — G40909 Epilepsy, unspecified, not intractable, without status epilepticus: Secondary | ICD-10-CM | POA: Diagnosis not present

## 2015-02-01 DIAGNOSIS — R41841 Cognitive communication deficit: Secondary | ICD-10-CM | POA: Diagnosis not present

## 2015-02-01 DIAGNOSIS — G8194 Hemiplegia, unspecified affecting left nondominant side: Secondary | ICD-10-CM | POA: Diagnosis not present

## 2015-02-01 DIAGNOSIS — R260 Ataxic gait: Secondary | ICD-10-CM | POA: Diagnosis not present

## 2015-02-01 DIAGNOSIS — R1310 Dysphagia, unspecified: Secondary | ICD-10-CM | POA: Diagnosis not present

## 2015-02-01 DIAGNOSIS — S065X0S Traumatic subdural hemorrhage without loss of consciousness, sequela: Secondary | ICD-10-CM | POA: Diagnosis not present

## 2015-02-02 DIAGNOSIS — R260 Ataxic gait: Secondary | ICD-10-CM | POA: Diagnosis not present

## 2015-02-02 DIAGNOSIS — G40909 Epilepsy, unspecified, not intractable, without status epilepticus: Secondary | ICD-10-CM | POA: Diagnosis not present

## 2015-02-02 DIAGNOSIS — G8194 Hemiplegia, unspecified affecting left nondominant side: Secondary | ICD-10-CM | POA: Diagnosis not present

## 2015-02-02 DIAGNOSIS — R1310 Dysphagia, unspecified: Secondary | ICD-10-CM | POA: Diagnosis not present

## 2015-02-02 DIAGNOSIS — S065X0S Traumatic subdural hemorrhage without loss of consciousness, sequela: Secondary | ICD-10-CM | POA: Diagnosis not present

## 2015-02-02 DIAGNOSIS — R41841 Cognitive communication deficit: Secondary | ICD-10-CM | POA: Diagnosis not present

## 2015-03-30 DIAGNOSIS — Z13220 Encounter for screening for lipoid disorders: Secondary | ICD-10-CM | POA: Diagnosis not present

## 2015-03-30 DIAGNOSIS — G40901 Epilepsy, unspecified, not intractable, with status epilepticus: Secondary | ICD-10-CM | POA: Diagnosis not present

## 2015-03-30 DIAGNOSIS — Z125 Encounter for screening for malignant neoplasm of prostate: Secondary | ICD-10-CM | POA: Diagnosis not present

## 2015-03-30 DIAGNOSIS — Z131 Encounter for screening for diabetes mellitus: Secondary | ICD-10-CM | POA: Diagnosis not present

## 2015-03-30 DIAGNOSIS — S065X0D Traumatic subdural hemorrhage without loss of consciousness, subsequent encounter: Secondary | ICD-10-CM | POA: Diagnosis not present

## 2015-04-04 ENCOUNTER — Other Ambulatory Visit: Payer: Self-pay

## 2015-04-04 NOTE — Patient Outreach (Signed)
This RNCM was successful in making contact with patient to schedule home visit for April 10, 2015 Patient was agreeable to review his medication for changes and compliance.

## 2015-04-10 ENCOUNTER — Other Ambulatory Visit: Payer: Self-pay

## 2015-04-10 NOTE — Patient Instructions (Addendum)
Patient to call [facility] for  further Case Management. Patient and this RNCM reviewed [facility] referral system, including ability to self refer

## 2015-04-10 NOTE — Patient Outreach (Signed)
[facility]) Care Management  04/10/2015  Patrick Torres [birth year] [id number]   This patient and RNCM reviewed goals and objectives.   Patient states being comfortable with discharge plan. This RNCM and patient reviewed referral process for [facility], including self referral This RNCM reviewed with patient contact for [facility]

## 2015-04-17 NOTE — Patient Outreach (Signed)
[facility]) Care Management  04/17/2015  Srinivas Lippman 1961-07-30 [id number]   Received notification from Loni Muse, RN to close case due goals met.  [name]. [place] CM Assistant Phone: [phone] Fax: [phone]

## 2015-04-19 ENCOUNTER — Encounter: Payer: Self-pay | Admitting: Internal Medicine

## 2015-07-02 DIAGNOSIS — Z1211 Encounter for screening for malignant neoplasm of colon: Secondary | ICD-10-CM | POA: Diagnosis not present

## 2015-07-02 DIAGNOSIS — S065X0D Traumatic subdural hemorrhage without loss of consciousness, subsequent encounter: Secondary | ICD-10-CM | POA: Diagnosis not present

## 2015-07-02 DIAGNOSIS — G40901 Epilepsy, unspecified, not intractable, with status epilepticus: Secondary | ICD-10-CM | POA: Diagnosis not present

## 2015-07-03 ENCOUNTER — Ambulatory Visit (INDEPENDENT_AMBULATORY_CARE_PROVIDER_SITE_OTHER): Payer: Commercial Managed Care - HMO | Admitting: Internal Medicine

## 2015-07-03 ENCOUNTER — Encounter: Payer: Self-pay | Admitting: Internal Medicine

## 2015-07-03 VITALS — BP 120/86 | HR 56 | Ht 67.0 in | Wt 214.1 lb

## 2015-07-03 DIAGNOSIS — Z1211 Encounter for screening for malignant neoplasm of colon: Secondary | ICD-10-CM

## 2015-07-03 MED ORDER — PEG-KCL-NACL-NASULF-NA ASC-C 100 G PO SOLR: 1.0000 | Freq: Once | ORAL | Status: DC
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
## 2015-07-03 NOTE — Patient Instructions (Addendum)
You have been scheduled for a colonoscopy. Please follow written instructions given to you at your visit today.  Please pick up your prep supplies at the pharmacy within the next 1-3 days. If you use inhalers (even only as needed), please bring them with you on the day of your procedure.    Dr Jackson Latino

## 2015-07-06 ENCOUNTER — Telehealth: Payer: Self-pay | Admitting: Internal Medicine

## 2015-07-06 NOTE — Telephone Encounter (Signed)
Called patient to inform him i  will give him a sample prep kit  When they come in.......Marland Kitchen

## 2015-07-09 ENCOUNTER — Telehealth: Payer: Self-pay | Admitting: Neurology

## 2015-07-09 NOTE — Telephone Encounter (Signed)
Patient called to give an update on levETIRAcetam (KEPPRA) 1000 MG tablet stating it had been changed to 1000mg  2x day. Patrick Torres it was 500mg  2 tab 2x day. Just an FYI. Prescibed by GI Dr. At [employer].

## 2015-07-26 ENCOUNTER — Telehealth: Payer: Self-pay

## 2015-07-26 NOTE — Telephone Encounter (Signed)
Talked to pt to pick up prep

## 2015-07-27 ENCOUNTER — Emergency Department (HOSPITAL_COMMUNITY): Payer: Commercial Managed Care - HMO

## 2015-07-27 ENCOUNTER — Emergency Department (HOSPITAL_COMMUNITY)
Admission: EM | Admit: 2015-07-27 | Discharge: 2015-07-28 | Disposition: A | Discharge status: Home / Self Care | Payer: Commercial Managed Care - HMO | Attending: Emergency Medicine | Admitting: Emergency Medicine

## 2015-07-27 ENCOUNTER — Encounter (HOSPITAL_COMMUNITY): Payer: Self-pay | Admitting: *Deleted

## 2015-07-27 DIAGNOSIS — Z8639 Personal history of other endocrine, nutritional and metabolic disease: Secondary | ICD-10-CM | POA: Insufficient documentation

## 2015-07-27 DIAGNOSIS — Z87820 Personal history of traumatic brain injury: Secondary | ICD-10-CM | POA: Insufficient documentation

## 2015-07-27 DIAGNOSIS — G40909 Epilepsy, unspecified, not intractable, without status epilepticus: Secondary | ICD-10-CM | POA: Diagnosis not present

## 2015-07-27 DIAGNOSIS — K5900 Constipation, unspecified: Secondary | ICD-10-CM

## 2015-07-27 DIAGNOSIS — Z79899 Other long term (current) drug therapy: Secondary | ICD-10-CM | POA: Diagnosis not present

## 2015-07-27 DIAGNOSIS — N132 Hydronephrosis with renal and ureteral calculous obstruction: Secondary | ICD-10-CM | POA: Diagnosis not present

## 2015-07-27 DIAGNOSIS — N200 Calculus of kidney: Secondary | ICD-10-CM | POA: Diagnosis not present

## 2015-07-27 DIAGNOSIS — R1110 Vomiting, unspecified: Secondary | ICD-10-CM | POA: Diagnosis present

## 2015-07-27 LAB — COMPREHENSIVE METABOLIC PANEL
ALT: 25 U/L (ref 17–63)
ANION GAP: 9 (ref 5–15)
AST: 41 U/L (ref 15–41)
Albumin: 4.1 g/dL (ref 3.5–5.0)
Alkaline Phosphatase: 58 U/L (ref 38–126)
BUN: 7 mg/dL (ref 6–20)
CALCIUM: 9.3 mg/dL (ref 8.9–10.3)
CHLORIDE: 108 mmol/L (ref 101–111)
CO2: 22 mmol/L (ref 22–32)
Creatinine, Ser: 1.31 mg/dL — ABNORMAL HIGH (ref 0.61–1.24)
GFR calc Af Amer: >60 mL/min (ref >60)
GFR calc non Af Amer: 60 mL/min — ABNORMAL LOW (ref >60)
Glucose, Bld: 110 mg/dL — ABNORMAL HIGH (ref 65–99)
Potassium: 5 mmol/L (ref 3.5–5.1)
Sodium: 139 mmol/L (ref 135–145)
TOTAL PROTEIN: 7.4 g/dL (ref 6.5–8.1)
Total Bilirubin: 0.9 mg/dL (ref 0.3–1.2)

## 2015-07-27 LAB — CBC
HEMATOCRIT: 46.7 % (ref 39.0–52.0)
HEMOGLOBIN: 16.1 g/dL (ref 13.0–17.0)
MCH: 31.2 pg (ref 26.0–34.0)
MCHC: 34.5 g/dL (ref 30.0–36.0)
MCV: 90.5 fL (ref 78.0–100.0)
PLATELETS: 176 10*3/uL (ref 150–400)
RBC: 5.16 MIL/uL (ref 4.22–5.81)
RDW: 13.2 % (ref 11.5–15.5)
WBC: 9.8 10*3/uL (ref 4.0–10.5)

## 2015-07-27 LAB — URINALYSIS, ROUTINE W REFLEX MICROSCOPIC
Bilirubin Urine: NEGATIVE
GLUCOSE, UA: NEGATIVE mg/dL
KETONES UR: NEGATIVE mg/dL
LEUKOCYTES UA: NEGATIVE
NITRITE: NEGATIVE
Protein, ur: 30 mg/dL — AB
Specific Gravity, Urine: 1.02 (ref 1.005–1.030)
UROBILINOGEN UA: 1 mg/dL (ref 0.0–1.0)
pH: 7.5 (ref 5.0–8.0)

## 2015-07-27 LAB — POC OCCULT BLOOD, ED: Fecal Occult Bld: NEGATIVE

## 2015-07-27 LAB — URINE MICROSCOPIC-ADD ON

## 2015-07-27 LAB — LIPASE, BLOOD: LIPASE: 51 U/L (ref 22–51)

## 2015-07-27 MED ORDER — MORPHINE SULFATE 4 MG/ML IJ SOLN
4.0000 mg | Freq: Once | INTRAMUSCULAR | Status: AC
  Administered 2015-07-27: 4 mg via INTRAVENOUS
  Filled 2015-07-27: qty 1

## 2015-07-27 MED ORDER — ONDANSETRON HCL 4 MG/2ML IJ SOLN
4.0000 mg | Freq: Once | INTRAMUSCULAR | Status: AC
  Administered 2015-07-27: 4 mg via INTRAVENOUS
  Filled 2015-07-27: qty 2

## 2015-07-27 MED ORDER — SODIUM CHLORIDE 0.9 % IV BOLUS (SEPSIS)
1000.0000 mL | Freq: Once | INTRAVENOUS | Status: AC
  Administered 2015-07-27: 1000 mL via INTRAVENOUS

## 2015-07-27 MED ORDER — IOHEXOL 300 MG/ML  SOLN
100.0000 mL | Freq: Once | INTRAMUSCULAR | Status: AC | PRN
  Administered 2015-07-27: 100 mL via INTRAVENOUS

## 2015-07-27 MED ORDER — OXYCODONE-ACETAMINOPHEN 5-325 MG PO TABS
1.0000 | ORAL_TABLET | Freq: Once | ORAL | Status: AC
  Administered 2015-07-27: 1 via ORAL
  Filled 2015-07-27: qty 1

## 2015-07-27 MED ORDER — IOHEXOL 300 MG/ML  SOLN
25.0000 mL | Freq: Once | INTRAMUSCULAR | Status: AC | PRN
  Administered 2015-07-27: 25 mL via ORAL
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
## 2015-07-27 NOTE — ED Notes (Signed)
Pt reports having constipation. Last bm was yesterday but it was very small amounts. Reports n/v that started today and also having difficulty urinating, only able to go small amounts each time.

## 2015-07-28 DIAGNOSIS — K5900 Constipation, unspecified: Secondary | ICD-10-CM | POA: Diagnosis not present

## 2015-07-28 DIAGNOSIS — Z87820 Personal history of traumatic brain injury: Secondary | ICD-10-CM | POA: Diagnosis not present

## 2015-07-28 DIAGNOSIS — Z79899 Other long term (current) drug therapy: Secondary | ICD-10-CM | POA: Diagnosis not present

## 2015-07-28 DIAGNOSIS — Z8639 Personal history of other endocrine, nutritional and metabolic disease: Secondary | ICD-10-CM | POA: Diagnosis not present

## 2015-07-28 DIAGNOSIS — G40909 Epilepsy, unspecified, not intractable, without status epilepticus: Secondary | ICD-10-CM | POA: Diagnosis not present

## 2015-07-28 DIAGNOSIS — N200 Calculus of kidney: Secondary | ICD-10-CM | POA: Diagnosis not present

## 2015-07-28 MED ORDER — OXYCODONE-ACETAMINOPHEN 5-325 MG PO TABS: 1.0000 | ORAL_TABLET | Freq: Four times a day (QID) | ORAL | Status: DC | PRN

## 2015-07-28 MED ORDER — POLYETHYLENE GLYCOL 3350 17 G PO PACK: 17.0000 g | PACK | Freq: Every day | ORAL | Status: DC

## 2015-07-28 MED ORDER — TAMSULOSIN HCL 0.4 MG PO CAPS: 0.4000 mg | ORAL_CAPSULE | Freq: Every day | ORAL | Status: DC

## 2015-07-28 NOTE — ED Notes (Signed)
Provided teaching with discharge instructions, and supplies to strain urine.

## 2015-07-28 NOTE — Discharge Instructions (Signed)
You have a 38mm kidney stone on the left side.  Use urine strainer to capture the stone and bring to urologist for further analysis.  Take pain medication as needed.  You have constipation, take miralax as it can help with your constipation.  Return to ER if your condition worsen or if you have other concerns.  Kidney Stones Kidney stones (urolithiasis) are deposits that form inside your kidneys. The intense pain is caused by the stone moving through the urinary tract. When the stone moves, the ureter goes into spasm around the stone. The stone is usually passed in the urine.  CAUSES   A disorder that makes certain neck glands produce too much parathyroid hormone (primary hyperparathyroidism).  A buildup of uric acid crystals, similar to gout in your joints.  Narrowing (stricture) of the ureter.  A kidney obstruction present at birth (congenital obstruction).  Previous surgery on the kidney or ureters.  Numerous kidney infections. SYMPTOMS   Feeling sick to your stomach (nauseous).  Throwing up (vomiting).  Blood in the urine (hematuria).  Pain that usually spreads (radiates) to the groin.  Frequency or urgency of urination. DIAGNOSIS   Taking a history and physical exam.  Blood or urine tests.  CT scan.  Occasionally, an examination of the inside of the urinary bladder (cystoscopy) is performed. TREATMENT   Observation.  Increasing your fluid intake.  Extracorporeal shock wave lithotripsy--This is a noninvasive procedure that uses shock waves to break up kidney stones.  Surgery may be needed if you have severe pain or persistent obstruction. There are various surgical procedures. Most of the procedures are performed with the use of small instruments. Only small incisions are needed to accommodate these instruments, so recovery time is minimized. The size, location, and chemical composition are all important variables that will determine the proper choice of action for  you. Talk to your health care provider to better understand your situation so that you will minimize the risk of injury to yourself and your kidney.  HOME CARE INSTRUCTIONS   Drink enough water and fluids to keep your urine clear or pale yellow. This will help you to pass the stone or stone fragments.  Strain all urine through the provided strainer. Keep all particulate matter and stones for your health care provider to see. The stone causing the pain may be as small as a grain of salt. It is very important to use the strainer each and every time you pass your urine. The collection of your stone will allow your health care provider to analyze it and verify that a stone has actually passed. The stone analysis will often identify what you can do to reduce the incidence of recurrences.  Only take over-the-counter or prescription medicines for pain, discomfort, or fever as directed by your health care provider.  Make a follow-up appointment with your health care provider as directed.  Get follow-up X-rays if required. The absence of pain does not always mean that the stone has passed. It may have only stopped moving. If the urine remains completely obstructed, it can cause loss of kidney function or even complete destruction of the kidney. It is your responsibility to make sure X-rays and follow-ups are completed. Ultrasounds of the kidney can show blockages and the status of the kidney. Ultrasounds are not associated with any radiation and can be performed easily in a matter of minutes. SEEK MEDICAL CARE IF:  You experience pain that is progressive and unresponsive to any pain medicine you have  been prescribed. SEEK IMMEDIATE MEDICAL CARE IF:   Pain cannot be controlled with the prescribed medicine.  You have a fever or shaking chills.  The severity or intensity of pain increases over 18 hours and is not relieved by pain medicine.  You develop a new onset of abdominal pain.  You feel faint or  pass out.  You are unable to urinate. MAKE SURE YOU:   Understand these instructions.  Will watch your condition.  Will get help right away if you are not doing well or get worse. Document Released: 12/08/2005 Document Revised: 08/10/2013 Document Reviewed: 05/11/2013 [facility] Patient Information 2015 [place], [place]. This information is not intended to replace advice given to you by your health care provider. Make sure you discuss any questions you have with your health care provider.  Constipation Constipation is when a person:  Poops (has a bowel movement) less than 3 times a week.  Has a hard time pooping.  Has poop that is dry, hard, or bigger than normal. HOME CARE   Eat foods with a lot of fiber in them. This includes fruits, vegetables, beans, and whole grains such as brown rice.  Avoid fatty foods and foods with a lot of sugar. This includes french fries, hamburgers, cookies, candy, and soda.  If you are not getting enough fiber from food, take products with added fiber in them (supplements).  Drink enough fluid to keep your pee (urine) clear or pale yellow.  Exercise on a regular basis, or as told by your doctor.  Go to the restroom when you feel like you need to poop. Do not hold it.  Only take medicine as told by your doctor. Do not take medicines that help you poop (laxatives) without talking to your doctor first. GET HELP RIGHT AWAY IF:   You have bright red blood in your poop (stool).  Your constipation lasts more than 4 days or gets worse.  You have belly (abdominal) or butt (rectal) pain.  You have thin poop (as thin as a pencil).  You lose weight, and it cannot be explained. MAKE SURE YOU:   Understand these instructions.  Will watch your condition.  Will get help right away if you are not doing well or get worse. Document Released: 05/26/2008 Document Revised: 12/13/2013 Document Reviewed: 09/19/2013 [facility] Patient Information 2015  [place], [place]. This information is not intended to replace advice given to you by your health care provider. Make sure you discuss any questions you have with your health care provider.

## 2015-07-29 ENCOUNTER — Telehealth: Payer: Self-pay | Admitting: *Deleted

## 2015-07-29 NOTE — Telephone Encounter (Signed)
Made Pharmacist aware that unless pt appears incapable of being able to manage written prescriptions he would need to find the written prescriptions he was given. Our policy is that we do not reissue these for any reason. They are never sent electronically and are given to the pt to be carried to the pharmacy of their choice.

## 2015-08-16 DIAGNOSIS — R6889 Other general symptoms and signs: Secondary | ICD-10-CM | POA: Diagnosis not present

## 2015-08-16 DIAGNOSIS — N201 Calculus of ureter: Secondary | ICD-10-CM | POA: Diagnosis not present

## 2015-08-23 HISTORY — PX: OTHER SURGICAL HISTORY: SHX169

## 2015-08-29 ENCOUNTER — Encounter: Payer: Self-pay | Admitting: Internal Medicine

## 2015-09-12 ENCOUNTER — Other Ambulatory Visit: Payer: Self-pay | Admitting: Nurse Practitioner

## 2015-09-13 DIAGNOSIS — N201 Calculus of ureter: Secondary | ICD-10-CM | POA: Diagnosis not present

## 2015-09-13 DIAGNOSIS — R6889 Other general symptoms and signs: Secondary | ICD-10-CM | POA: Diagnosis not present

## 2015-09-13 NOTE — Telephone Encounter (Signed)
I called pt and asked if he needed this refill prior to appt.  He stated that he would like it.  I told him I would do one month.  He was ok with this.  Has appt 09-25-15.

## 2015-09-17 ENCOUNTER — Telehealth: Payer: Self-pay | Admitting: Gastroenterology

## 2015-09-17 NOTE — Telephone Encounter (Signed)
All instructions reviewed and questions answered. Verified that patient has a driver to bring him and stay with him to take him home tomorrow as he had to take the bus today. He states his nephew will be bringing him and staying as he has his procedure and then bringing him home.

## 2015-09-17 NOTE — Telephone Encounter (Signed)
Returned phone call to patient. He states he is confused about instructions. While talking to him I discovered that he was given a Suprep sample but given instructions for Moviprep. He is going to come into [facility] today for me to review instructions with him for Suprep.

## 2015-09-18 ENCOUNTER — Encounter: Payer: Self-pay | Admitting: Gastroenterology

## 2015-09-18 ENCOUNTER — Ambulatory Visit (AMBULATORY_SURGERY_CENTER): Payer: Commercial Managed Care - HMO | Admitting: Gastroenterology

## 2015-09-18 VITALS — BP 132/72 | HR 54 | Temp 97.2°F | Resp 25 | Ht 67.0 in | Wt 214.0 lb

## 2015-09-18 DIAGNOSIS — K635 Polyp of colon: Secondary | ICD-10-CM | POA: Diagnosis not present

## 2015-09-18 DIAGNOSIS — D122 Benign neoplasm of ascending colon: Secondary | ICD-10-CM

## 2015-09-18 DIAGNOSIS — Z1211 Encounter for screening for malignant neoplasm of colon: Secondary | ICD-10-CM

## 2015-09-18 DIAGNOSIS — G40909 Epilepsy, unspecified, not intractable, without status epilepticus: Secondary | ICD-10-CM | POA: Diagnosis not present

## 2015-09-18 DIAGNOSIS — D127 Benign neoplasm of rectosigmoid junction: Secondary | ICD-10-CM | POA: Diagnosis not present

## 2015-09-18 DIAGNOSIS — D123 Benign neoplasm of transverse colon: Secondary | ICD-10-CM | POA: Diagnosis not present

## 2015-09-18 DIAGNOSIS — E669 Obesity, unspecified: Secondary | ICD-10-CM | POA: Diagnosis not present

## 2015-09-18 DIAGNOSIS — D125 Benign neoplasm of sigmoid colon: Secondary | ICD-10-CM

## 2015-09-18 MED ORDER — SODIUM CHLORIDE 0.9 % IV SOLN: 500.0000 mL | INTRAVENOUS | Status: DC

## 2015-09-18 NOTE — Patient Instructions (Addendum)
YOU HAD AN ENDOSCOPIC PROCEDURE TODAY AT [facility]:   Refer to the procedure report that was given to you for any specific questions about what was found during the examination.  If the procedure report does not answer your questions, please call your gastroenterologist to clarify.  If you requested that your care partner not be given the details of your procedure findings, then the procedure report has been included in a sealed envelope for you to review at your convenience later.  YOU SHOULD EXPECT: Some feelings of bloating in the abdomen. Passage of more gas than usual.  Walking can help get rid of the air that was put into your GI tract during the procedure and reduce the bloating. If you had a lower endoscopy (such as a colonoscopy or flexible sigmoidoscopy) you may notice spotting of blood in your stool or on the toilet paper. If you underwent a bowel prep for your procedure, you may not have a normal bowel movement for a few days.  Please Note:  You might notice some irritation and congestion in your nose or some drainage.  This is from the oxygen used during your procedure.  There is no need for concern and it should clear up in a day or so.  SYMPTOMS TO REPORT IMMEDIATELY:   Following lower endoscopy (colonoscopy or flexible sigmoidoscopy):  Excessive amounts of blood in the stool  Significant tenderness or worsening of abdominal pains  Swelling of the abdomen that is new, acute  Fever of 100F or higher   For urgent or emergent issues, a gastroenterologist can be reached at any hour by calling [phone].   DIET: Your first meal following the procedure should be a small meal and then it is ok to progress to your normal diet. Heavy or fried foods are harder to digest and may make you feel nauseous or bloated.  Likewise, meals heavy in dairy and vegetables can increase bloating.  Drink plenty of fluids but you should avoid alcoholic beverages for 24  hours.  ACTIVITY:  You should plan to take it easy for the rest of today and you should NOT DRIVE or use heavy machinery until tomorrow (because of the sedation medicines used during the test).    FOLLOW UP: Our staff will call the number listed on your records the next business day following your procedure to check on you and address any questions or concerns that you may have regarding the information given to you following your procedure. If we do not reach you, we will leave a message.  However, if you are feeling well and you are not experiencing any problems, there is no need to return our call.  We will assume that you have returned to your regular daily activities without incident.  If any biopsies were taken you will be contacted by phone or by letter within the next 1-3 weeks.  Please call us at [phone] if you have not heard about the biopsies in 3 weeks.    SIGNATURES/CONFIDENTIALITY: You and/or your care partner have signed paperwork which will be entered into your electronic medical record.  These signatures attest to the fact that that the information above on your After Visit Summary has been reviewed and is understood.  Full responsibility of the confidentiality of this discharge information lies with you and/or your care-partner.   Handouts were given to your care partner on polyps, hemorrhoids, and a high fiber diet with liberal fluid intake. Hold aspirin and all  NSAIDS for 2 weeks due to polyps that were removed. You may resume your other current medications today. Await biopsy results. Please call if any questions or concerns.

## 2015-09-18 NOTE — Progress Notes (Signed)
Called to room to assist during endoscopic procedure.  Patient ID and intended procedure confirmed with present staff. Received instructions for my participation in the procedure from the performing physician.  

## 2015-09-18 NOTE — Op Note (Signed)
[facility]  [address] [place], [address]   COLONOSCOPY PROCEDURE REPORT  PATIENT: Stokes, Rattigan  MR#: [id number] BIRTHDATE: April 04, 1961 , 28  yrs. old GENDER: male ENDOSCOPIST: Butler Cellar, MD REFERRED BY: Nolene Ebbs MD PROCEDURE DATE:  09/18/2015 PROCEDURE:   Colonoscopy with snare polypectomy First Screening Colonoscopy - Avg.  risk and is 50 yrs.  old or older Yes.  Prior Negative Screening - Now for repeat screening. N/A  History of Adenoma - Now for follow-up colonoscopy & has been > or = to 3 yrs.  N/A  Polyps removed today? Yes ASA CLASS:   Class II INDICATIONS:Screening for colonic neoplasia and Colorectal Neoplasm Risk Assessment for this procedure is average risk. MEDICATIONS: Propofol 260 mg IV  DESCRIPTION OF PROCEDURE:   After the risks benefits and alternatives of the procedure were thoroughly explained, informed consent was obtained.  The digital rectal exam revealed no abnormalities of the rectum.   The [id number]  endoscope was introduced through the anus and advanced to the cecum, which was identified by both the appendix and ileocecal valve. No adverse events experienced.   The quality of the prep was good.  The instrument was then slowly withdrawn as the colon was fully examined. Estimated blood loss is zero unless otherwise noted in this procedure report.  COLON FINDINGS: A sessile polyp measuring 3 mm in size was found in the ascending colon.  A polypectomy was performed with a cold snare.  The resection was complete, the polyp tissue was completely retrieved and sent to histology.   A sessile polyp measuring 4 mm in size was found in the rectosigmoid colon.  A polypectomy was performed with a cold snare.  The resection was complete, the polyp tissue was completely retrieved and sent to histology.   A semi-pedunculated polyp measuring 7 mm in size was found in the rectosigmoid colon.  A polypectomy was performed  using snare cautery.  The resection was complete, the polyp tissue was completely retrieved and sent to histology.   The surgical anastomosis was noted in the rectosigmoid colon and appeared healthy and patent.  The remainder of the examined colon was normal.  Retroflexed views revealed internal hemorrhoids. The time to cecum = 4.8 Withdrawal time = 14.3   The scope was withdrawn and the procedure completed. COMPLICATIONS: There were no immediate complications.  ENDOSCOPIC IMPRESSION: 1.   Sessile polyp was found in the ascending colon; polypectomy was performed with a cold snare 2.   Sessile polyp was found in the rectosigmoid colon; polypectomy was performed with a cold snare 3.   Semi-pedunculated polyp was found in the rectosigmoid colon; polypectomy was performed using snare cautery 4.   The surgical anastomosis was noted in the rectosigmoid colon and appeared healthy and patent.  The remainder of the examined colon was normal  RECOMMENDATIONS: 1.  Hold Aspirin and all other NSAIDS for 2 weeks. 2.  Await pathology results 3.  Resume diet 4.  Resume medications  eSigned:  [place] Cellar, MD 09/18/2015 1:46 PM   cc: Nolene Ebbs, MD  , the patient   PATIENT NAME:  Offie, Waide MR#: [id number]

## 2015-09-18 NOTE — Progress Notes (Signed)
Procedure ends, to recovery, report to Jannifer Franklin, Therapist, sports, VSS

## 2015-09-18 NOTE — Progress Notes (Signed)
Pt to the restroom before discharge.  maw

## 2015-09-18 NOTE — Progress Notes (Signed)
No problems noted in the recovery room. maw 

## 2015-09-19 ENCOUNTER — Telehealth: Payer: Self-pay

## 2015-09-19 ENCOUNTER — Telehealth: Payer: Self-pay | Admitting: Gastroenterology

## 2015-09-19 NOTE — Telephone Encounter (Signed)
  Follow up Call-  Call back number 09/18/2015  Post procedure Call Back phone  # [phone]  Permission to leave phone message Yes     Patient questions:  Do you have a fever, pain , or abdominal swelling? No. Pain Score  0 *  Have you tolerated food without any problems? Yes.    Have you been able to return to your normal activities? Yes.    Do you have any questions about your discharge instructions: Diet   No. Medications  No. Follow up visit  No.  Do you have questions or concerns about your Care? No.  Actions: * If pain score is 4 or above: No action needed, pain <4.  No problems per the pt. maw

## 2015-09-19 NOTE — Telephone Encounter (Signed)
Spoke with patient and answered his questions about polyps.

## 2015-09-24 ENCOUNTER — Encounter: Payer: Self-pay | Admitting: Gastroenterology

## 2015-09-25 ENCOUNTER — Ambulatory Visit: Payer: Medicare HMO | Admitting: Nurse Practitioner

## 2015-09-26 ENCOUNTER — Encounter: Payer: Self-pay | Admitting: Nurse Practitioner

## 2015-09-26 NOTE — Patient Outreach (Addendum)
[facility]) Care Management  09/26/2015  Patrick Torres December 16, 1961 [id number]   Referral from [facility] tier 4 list, assigned to Dannielle Huh, RN for patient outreach.  Eilleen Davoli L. Paulyne Mooty, [facility] Care Management Assistant

## 2015-09-27 ENCOUNTER — Other Ambulatory Visit: Payer: Self-pay

## 2015-09-27 ENCOUNTER — Encounter: Payer: Self-pay | Admitting: Nurse Practitioner

## 2015-09-27 ENCOUNTER — Ambulatory Visit (INDEPENDENT_AMBULATORY_CARE_PROVIDER_SITE_OTHER): Payer: Commercial Managed Care - HMO | Admitting: Nurse Practitioner

## 2015-09-27 ENCOUNTER — Other Ambulatory Visit: Payer: Self-pay | Admitting: *Deleted

## 2015-09-27 VITALS — BP 127/77 | HR 58 | Ht 68.0 in | Wt 213.2 lb

## 2015-09-27 DIAGNOSIS — G40309 Generalized idiopathic epilepsy and epileptic syndromes, not intractable, without status epilepticus: Secondary | ICD-10-CM

## 2015-09-27 DIAGNOSIS — S069X0S Unspecified intracranial injury without loss of consciousness, sequela: Secondary | ICD-10-CM

## 2015-09-27 DIAGNOSIS — Z5181 Encounter for therapeutic drug level monitoring: Secondary | ICD-10-CM | POA: Diagnosis not present

## 2015-09-27 MED ORDER — CARBAMAZEPINE ER 400 MG PO TB12: 400.0000 mg | ORAL_TABLET | Freq: Two times a day (BID) | ORAL | Status: DC

## 2015-09-27 MED ORDER — LEVETIRACETAM 1000 MG PO TABS: 1000.0000 mg | ORAL_TABLET | Freq: Two times a day (BID) | ORAL | Status: DC

## 2015-09-27 NOTE — Telephone Encounter (Signed)
Patient prefers to use mail order.  [facility] cancelled their Rx's.

## 2015-09-27 NOTE — Patient Instructions (Signed)
Continue Tegretol at current dose will refill Continue Keppra current dose will refill  carbamazepine level Call for seizure activity Follow-up yearly and when necessary
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
## 2015-09-28 LAB — BASIC METABOLIC PANEL
BUN/Creatinine Ratio: 10 (ref 9–20)
BUN: 10 mg/dL (ref 6–24)
CO2: 20 mmol/L (ref 18–29)
Calcium: 9.5 mg/dL (ref 8.7–10.2)
Chloride: 105 mmol/L (ref 97–108)
Creatinine, Ser: 0.96 mg/dL (ref 0.76–1.27)
GFR, EST AFRICAN AMERICAN: 102 mL/min/{1.73_m2} (ref >59)
GFR, EST NON AFRICAN AMERICAN: 88 mL/min/{1.73_m2} (ref >59)
Glucose: 91 mg/dL (ref 65–99)
Potassium: 4 mmol/L (ref 3.5–5.2)
Sodium: 142 mmol/L (ref 134–144)

## 2015-09-28 NOTE — Progress Notes (Signed)
I reviewed note and agree with plan.   Penni Bombard, MD 65/03/6502, 5:46 PM Certified in Neurology, Neurophysiology and Neuroimaging  [facility] [address] [place], [place] [address] [phone]

## 2015-10-01 ENCOUNTER — Other Ambulatory Visit: Payer: Self-pay | Admitting: *Deleted

## 2015-10-01 NOTE — Patient Outreach (Signed)
[facility]) Care Management  10/01/2015  Patrick Torres 02-02-1961 [id number]   Assessment: Telephone screen follow-up Unable to receive any call back from patient as stated last week. Call placed to patient but unable to reach him. HIPAA compliant voice message left with name and contact information.  Plan: Will await for return call. If unable to receive call back from patient, will schedule for the next outreach call.   France Lusty A. Owynn Mosqueda, BSN, RN-BC [place] Management Coordinator Cell: [phone]

## 2015-10-02 ENCOUNTER — Other Ambulatory Visit: Payer: Self-pay | Admitting: *Deleted

## 2015-10-02 NOTE — Patient Outreach (Signed)
[facility]) Care Management  10/02/2015  RODDRICK SHARRON 20-Dec-1961 [id number]   Assessment: Telephone screen - 2nd attempt Unable to receive a call back from patient to message left yesterday. Call placed to patient but unable to reach him. HIPAA compliant voice message with name and contact information. Call placed to emergency contact Milagros Reap) but patient is not there. Left message to call back.  Plan: Will await for return call. If unable to receive a call back, will schedule patient for next outreach call.  Ladan Vanderzanden A. Shakaya Bhullar, BSN, RN-BC [place] Management Coordinator Cell: [phone]

## 2015-10-03 ENCOUNTER — Other Ambulatory Visit: Payer: Self-pay | Admitting: *Deleted

## 2015-10-03 ENCOUNTER — Encounter: Payer: Self-pay | Admitting: *Deleted

## 2015-10-03 NOTE — Patient Outreach (Signed)
[facility]) Care Management  10/03/2015  Patrick Torres [birth year] [id number]    Assessment: Telephone screen - third attempt Unable to receive return call from patient to message left yesterday.  Call placed and spoke with patient. Identity verified. Care management coordinator introduced self and purpose of the call. Patient denies any health needs at this time. He states that he "lives alone and is very independent". Patient also reports that he cooks and prepares his own food and "no family members supporting him".  Mr. Vasil reports that he has last seen his primary care provider "a month or so ago before colonoscopy". Patient verbalize "no problem with transportation" because doctor's office is just walking distance" from where he lives. Patient reports " I got all my medicines which are prescribed by neurologist" whom he saw a week ago as stated. He continued to say "I have all my medicine supplies".  Patient verbalize to care management coordinator that he has "no health concerns", "I'm alright so far, right now" as stated. He also mentioned not being interested with [facility] services at the moment.  [facility], care management coordinator and 24-hour nurse line contact numbers provided to patient in case he changes his mind or if future needs arise. Received a staff message that patient called [facility] and said he does not want [place] to call him.   Plan: Will close case.  Will notify primary care provider of case closure.    Thad Osoria A. Derreck Wiltsey, BSN, RN-BC [place] Management Coordinator Cell: [phone]

## 2015-10-04 NOTE — Patient Outreach (Signed)
[facility]) Care Management  10/04/2015  Patrick Torres 1961/01/15 [id number]   Notification from Dannielle Huh, RN to close case due to patient refused [name] Management services.  Thanks, [name]. [place], [place] Assistant Phone: [phone] Fax: [phone]

## 2015-11-26 DIAGNOSIS — Z719 Counseling, unspecified: Secondary | ICD-10-CM | POA: Diagnosis not present

## 2015-11-26 DIAGNOSIS — G40901 Epilepsy, unspecified, not intractable, with status epilepticus: Secondary | ICD-10-CM | POA: Diagnosis not present

## 2015-11-26 DIAGNOSIS — Z23 Encounter for immunization: Secondary | ICD-10-CM | POA: Diagnosis not present

## 2015-11-26 DIAGNOSIS — S065X0D Traumatic subdural hemorrhage without loss of consciousness, subsequent encounter: Secondary | ICD-10-CM | POA: Diagnosis not present

## 2015-12-13 DIAGNOSIS — Z0000 Encounter for general adult medical examination without abnormal findings: Secondary | ICD-10-CM | POA: Diagnosis not present

## 2015-12-13 DIAGNOSIS — G40804 Other epilepsy, intractable, without status epilepticus: Secondary | ICD-10-CM | POA: Diagnosis not present

## 2015-12-13 DIAGNOSIS — E669 Obesity, unspecified: Secondary | ICD-10-CM | POA: Diagnosis not present

## 2015-12-13 DIAGNOSIS — Z6831 Body mass index (BMI) 31.0-31.9, adult: Secondary | ICD-10-CM | POA: Diagnosis not present
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
## 2016-07-15 ENCOUNTER — Telehealth: Payer: Self-pay | Admitting: Nurse Practitioner

## 2016-07-15 NOTE — Telephone Encounter (Signed)
Pt request refill for carbamazepine (TEGRETOL XR) 400 MG 12 hr tablet . Please send to [place]

## 2016-07-16 MED ORDER — CARBAMAZEPINE ER 400 MG PO TB12
400.0000 mg | ORAL_TABLET | Freq: Two times a day (BID) | ORAL | 0 refills | Status: DC
Start: 2016-07-16 — End: 2016-10-03

## 2016-07-16 NOTE — Telephone Encounter (Signed)
I called and LMVM for pt that will refill for 3 months.   Has appt in 09-30-16 (keep appt).  LMVM for pt to call back about his dose (just to confirm, carbamazepine XR 400mg  po bid.  I had spoken to [facility] about his refill status, and they stated he did not have refill for this medication.

## 2016-07-16 NOTE — Telephone Encounter (Signed)
Pt called back.  Confirmed that he is taking carbamazepine 400mg  po bid.  He has refill on file for this medication.  Confirmed appt date and time.

## 2016-07-29 ENCOUNTER — Other Ambulatory Visit: Payer: Self-pay | Admitting: Nurse Practitioner

## 2016-09-30 ENCOUNTER — Ambulatory Visit: Payer: Self-pay | Admitting: Nurse Practitioner

## 2016-10-01 ENCOUNTER — Encounter: Payer: Self-pay | Admitting: Nurse Practitioner

## 2016-10-01 ENCOUNTER — Telehealth: Payer: Self-pay | Admitting: Nurse Practitioner

## 2016-10-01 NOTE — Telephone Encounter (Signed)
Spoke to pt and he has appt 10-03-16 at 1000.  He has enough to get by until then.  [facility] is his new drug store.

## 2016-10-01 NOTE — Telephone Encounter (Signed)
[facility] [phone] called to advise, patient has transferred prescriptions to their pharmacy, request refills of carbamazepine (TEGRETOL XR) 400 MG 12 hr tablet and levETIRAcetam (KEPPRA) 1000 MG tablet.

## 2016-10-02 ENCOUNTER — Ambulatory Visit: Payer: Self-pay | Admitting: Nurse Practitioner

## 2016-10-03 ENCOUNTER — Ambulatory Visit (INDEPENDENT_AMBULATORY_CARE_PROVIDER_SITE_OTHER): Payer: Medicare HMO | Admitting: Nurse Practitioner

## 2016-10-03 ENCOUNTER — Encounter: Payer: Self-pay | Admitting: Nurse Practitioner

## 2016-10-03 VITALS — BP 124/79 | HR 43 | Ht 68.0 in | Wt 219.4 lb

## 2016-10-03 DIAGNOSIS — G40309 Generalized idiopathic epilepsy and epileptic syndromes, not intractable, without status epilepticus: Secondary | ICD-10-CM | POA: Diagnosis not present

## 2016-10-03 DIAGNOSIS — S069X0S Unspecified intracranial injury without loss of consciousness, sequela: Secondary | ICD-10-CM | POA: Diagnosis not present

## 2016-10-03 DIAGNOSIS — Z5181 Encounter for therapeutic drug level monitoring: Secondary | ICD-10-CM

## 2016-10-03 MED ORDER — CARBAMAZEPINE ER 400 MG PO TB12: 400.0000 mg | ORAL_TABLET | Freq: Two times a day (BID) | ORAL | 3 refills | Status: DC

## 2016-10-03 MED ORDER — LEVETIRACETAM 1000 MG PO TABS: 1000.0000 mg | ORAL_TABLET | Freq: Two times a day (BID) | ORAL | 3 refills | Status: DC
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
## 2016-10-03 NOTE — Patient Instructions (Signed)
Continue Tegretol at current dose will refill Continue Keppra at current dose will refill Will check labs today, CBZ CBC and CMP Call for  seizure activity Followup yearly and when necessary

## 2016-10-04 LAB — COMPREHENSIVE METABOLIC PANEL
ALK PHOS: 53 IU/L (ref 39–117)
ALT: 17 IU/L (ref 0–44)
AST: 17 IU/L (ref 0–40)
Albumin/Globulin Ratio: 1.6 (ref 1.2–2.2)
Albumin: 4.1 g/dL (ref 3.5–5.5)
BILIRUBIN TOTAL: 0.4 mg/dL (ref 0.0–1.2)
BUN/Creatinine Ratio: 9 (ref 9–20)
BUN: 8 mg/dL (ref 6–24)
CHLORIDE: 106 mmol/L (ref 96–106)
CO2: 24 mmol/L (ref 18–29)
Calcium: 9.2 mg/dL (ref 8.7–10.2)
Creatinine, Ser: 0.94 mg/dL (ref 0.76–1.27)
GFR calc Af Amer: 104 mL/min/{1.73_m2} (ref >59)
GFR calc non Af Amer: 90 mL/min/{1.73_m2} (ref >59)
GLUCOSE: 96 mg/dL (ref 65–99)
Globulin, Total: 2.6 g/dL (ref 1.5–4.5)
Potassium: 4 mmol/L (ref 3.5–5.2)
Sodium: 142 mmol/L (ref 134–144)
Total Protein: 6.7 g/dL (ref 6.0–8.5)

## 2016-10-04 LAB — CBC WITH DIFFERENTIAL/PLATELET
BASOS ABS: 0 10*3/uL (ref 0.0–0.2)
Basos: 1 %
EOS (ABSOLUTE): 0.4 10*3/uL (ref 0.0–0.4)
Eos: 8 %
HEMOGLOBIN: 14.1 g/dL (ref 12.6–17.7)
Hematocrit: 41.6 % (ref 37.5–51.0)
IMMATURE GRANS (ABS): 0 10*3/uL (ref 0.0–0.1)
IMMATURE GRANULOCYTES: 0 %
Lymphocytes Absolute: 1.8 10*3/uL (ref 0.7–3.1)
Lymphs: 35 %
MCH: 30.7 pg (ref 26.6–33.0)
MCHC: 33.9 g/dL (ref 31.5–35.7)
MCV: 90 fL (ref 79–97)
MONOCYTES: 10 %
Monocytes Absolute: 0.5 10*3/uL (ref 0.1–0.9)
NEUTROS PCT: 46 %
Neutrophils Absolute: 2.3 10*3/uL (ref 1.4–7.0)
Platelets: 166 10*3/uL (ref 150–379)
RBC: 4.6 x10E6/uL (ref 4.14–5.80)
RDW: 14.2 % (ref 12.3–15.4)
WBC: 5.1 10*3/uL (ref 3.4–10.8)

## 2016-10-04 LAB — CARBAMAZEPINE LEVEL, TOTAL: CARBAMAZEPINE LVL: 12.1 ug/mL — AB (ref 4.0–12.0)

## 2016-10-07 ENCOUNTER — Telehealth: Payer: Self-pay | Admitting: *Deleted

## 2016-10-07 NOTE — Telephone Encounter (Signed)
-----   Message from Dennie Bible, NP sent at 10/06/2016  8:22 AM EDT ----- Labs ok please call the patient

## 2016-10-07 NOTE — Telephone Encounter (Signed)
I LMVM for pt that was calling with lab results.   At his convenience to call us back.   If he does you may let him know that his lab results were ok.

## 2016-10-08 ENCOUNTER — Encounter: Payer: Self-pay | Admitting: *Deleted

## 2016-10-08 NOTE — Telephone Encounter (Signed)
Mailed results letter to pt

## 2016-10-09 NOTE — Progress Notes (Signed)
I agree with the above plan 

## 2016-12-30 ENCOUNTER — Encounter (HOSPITAL_COMMUNITY): Payer: Self-pay | Admitting: Emergency Medicine

## 2016-12-30 ENCOUNTER — Emergency Department (HOSPITAL_COMMUNITY)
Admission: EM | Admit: 2016-12-30 | Discharge: 2016-12-30 | Disposition: A | Discharge status: Home / Self Care | Payer: PPO | Attending: Emergency Medicine | Admitting: Emergency Medicine

## 2016-12-30 DIAGNOSIS — R1110 Vomiting, unspecified: Secondary | ICD-10-CM | POA: Diagnosis not present

## 2016-12-30 DIAGNOSIS — R12 Heartburn: Secondary | ICD-10-CM | POA: Diagnosis not present

## 2016-12-30 LAB — URINALYSIS, ROUTINE W REFLEX MICROSCOPIC
BACTERIA UA: NONE SEEN
GLUCOSE, UA: NEGATIVE mg/dL
HGB URINE DIPSTICK: NEGATIVE
KETONES UR: NEGATIVE mg/dL
LEUKOCYTES UA: NEGATIVE
NITRITE: NEGATIVE
PROTEIN: 100 mg/dL — AB
Specific Gravity, Urine: 1.031 — ABNORMAL HIGH (ref 1.005–1.030)
pH: 5 (ref 5.0–8.0)

## 2016-12-30 LAB — COMPREHENSIVE METABOLIC PANEL
ALBUMIN: 4.1 g/dL (ref 3.5–5.0)
ALT: 22 U/L (ref 17–63)
ANION GAP: 8 (ref 5–15)
AST: 24 U/L (ref 15–41)
Alkaline Phosphatase: 54 U/L (ref 38–126)
BILIRUBIN TOTAL: 0.7 mg/dL (ref 0.3–1.2)
BUN: 8 mg/dL (ref 6–20)
CO2: 29 mmol/L (ref 22–32)
Calcium: 9.8 mg/dL (ref 8.9–10.3)
Chloride: 105 mmol/L (ref 101–111)
Creatinine, Ser: 1.2 mg/dL (ref 0.61–1.24)
GFR calc non Af Amer: >60 mL/min (ref >60)
GLUCOSE: 111 mg/dL — AB (ref 65–99)
POTASSIUM: 3.9 mmol/L (ref 3.5–5.1)
Sodium: 142 mmol/L (ref 135–145)
TOTAL PROTEIN: 7.7 g/dL (ref 6.5–8.1)

## 2016-12-30 LAB — CBC
HEMATOCRIT: 46.8 % (ref 39.0–52.0)
HEMOGLOBIN: 16.2 g/dL (ref 13.0–17.0)
MCH: 31.2 pg (ref 26.0–34.0)
MCHC: 34.6 g/dL (ref 30.0–36.0)
MCV: 90.2 fL (ref 78.0–100.0)
Platelets: 186 10*3/uL (ref 150–400)
RBC: 5.19 MIL/uL (ref 4.22–5.81)
RDW: 12.3 % (ref 11.5–15.5)
WBC: 6.7 10*3/uL (ref 4.0–10.5)

## 2016-12-30 LAB — LIPASE, BLOOD: Lipase: 16 U/L (ref 11–51)

## 2016-12-30 MED ORDER — ONDANSETRON 4 MG PO TBDP
8.0000 mg | ORAL_TABLET | Freq: Once | ORAL | Status: AC
  Administered 2016-12-30: 8 mg via ORAL
  Filled 2016-12-30: qty 2

## 2016-12-30 MED ORDER — GI COCKTAIL ~~LOC~~
30.0000 mL | Freq: Once | ORAL | Status: AC
  Administered 2016-12-30: 30 mL via ORAL
  Filled 2016-12-30: qty 30

## 2016-12-30 NOTE — ED Notes (Signed)
Pt states he has had vomiting since Saturday and now is having burning in his chest while drinking.

## 2016-12-30 NOTE — ED Notes (Signed)
Pt given water 

## 2016-12-30 NOTE — ED Notes (Signed)
Pt is in stable condition upon d/c and is escorted from ED via wheelchair. 
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
## 2016-12-30 NOTE — Discharge Instructions (Signed)
For the discomfort, which you're having, take Maalox or Tums, before meals and at bedtime for 1 week.  Try eating 3 regular meals each day.  Drink plenty of water each day to keep yourself well hydrated.

## 2016-12-30 NOTE — ED Notes (Signed)
Gave pt water, per Elmyra Ricks - RN.

## 2016-12-30 NOTE — ED Triage Notes (Signed)
Pt c/o throwing up a lot, 3-4. Times. Started Saturday. Denies blood in vomit. C/o center belly pain.

## 2016-12-30 NOTE — ED Notes (Signed)
Pt tolerated oral challenge well. No emesis or increased nausea noted.

## 2017-01-01 ENCOUNTER — Telehealth: Payer: Self-pay | Admitting: Family Medicine

## 2017-01-01 NOTE — Telephone Encounter (Signed)
I cannot make any further recommendations since I have not examined him. Thanks.

## 2017-01-01 NOTE — Telephone Encounter (Signed)
(  Pt has a new pt appointment with you on Tues 01/06/17) Pt called & states he went to hospital since then he has had vomiting and still had stomach pain.  Wanted to speak with Vickie & see what she recommended.  He states that the vomiting has now stopped and he has started having bowel movements. He said the hospital said it was heartburn and recommended Tums and Maalox, but he hasn't tried the Maalox.   I advised you didn't have any sooner appointments and I recommend a BRAT diet and try the Maalox that the hospital recommended.  I also recommended if he got any worse to return to the hospital.  Please advise if you recommend anything further.

## 2017-01-06 ENCOUNTER — Encounter: Payer: Self-pay | Admitting: Family Medicine

## 2017-01-06 ENCOUNTER — Ambulatory Visit (INDEPENDENT_AMBULATORY_CARE_PROVIDER_SITE_OTHER): Payer: PPO | Admitting: Family Medicine

## 2017-01-06 VITALS — BP 110/78 | HR 61 | Wt 216.2 lb

## 2017-01-06 DIAGNOSIS — R809 Proteinuria, unspecified: Secondary | ICD-10-CM

## 2017-01-06 DIAGNOSIS — G40909 Epilepsy, unspecified, not intractable, without status epilepticus: Secondary | ICD-10-CM

## 2017-01-06 DIAGNOSIS — Z7689 Persons encountering health services in other specified circumstances: Secondary | ICD-10-CM | POA: Diagnosis not present

## 2017-01-06 LAB — POCT URINALYSIS DIPSTICK
Bilirubin, UA: NEGATIVE
GLUCOSE UA: NEGATIVE
KETONES UA: NEGATIVE
Leukocytes, UA: NEGATIVE
Nitrite, UA: NEGATIVE
RBC UA: NEGATIVE
UROBILINOGEN UA: NEGATIVE
pH, UA: 5.5

## 2017-01-06 NOTE — Progress Notes (Signed)
   Subjective:    Patient ID: Patrick Torres, male    DOB: 09-Nov-1961, 56 y.o.   MRN: [id number]  HPI Chief Complaint  Patient presents with  . new pt    new pt, get established.    He is new to the practice and here to establish care.  Previous medical care at Dr. Ralph Leyden and is switching to me due to new Medicare.  He has no concerns or complaints today. He was recently seen in the ED for GERD and viral illness. States symptoms have resolved.   Discussed that review of his labs reveals that he has a history of protein in his urine. He was not aware of this.   His history if significant for seizure disorder. This is managed by his neurologist.  Other providers: Dr. Leonie Man- neurologist.  GI: Dr. Olevia Perches.   Lives alone. Divorced. 3 kids. His kids are in [place], [place], and [place].   Denies smoking, drinking alcohol, drug use.   Reviewed allergies, medications, past medical, surgical, and social history.    Review of Systems Pertinent positives and negatives in the history of present illness.     Objective:   Physical Exam BP 110/78   Pulse 61   Wt 216 lb 3.2 oz (98.1 kg)   BMI 32.87 kg/m   Alert and oriented and in no acute distress. Not otherwise examined.       Assessment & Plan:  Proteinuria, unspecified type - Plan: POCT urinalysis dipstick, Protein, urine, 24 hour  Seizure disorder ([place])  Encounter to establish care  Review of labs shows history of proteinuria and UA dipstick today shows a trace of protein.  normal serum protein on 12/30/2016. Plan to order a 24 hour urine protein.   He will continue seeing neuro for seizure disorder.

## 2017-01-09 ENCOUNTER — Encounter: Payer: Self-pay | Admitting: Family Medicine

## 2017-01-09 ENCOUNTER — Other Ambulatory Visit: Payer: Self-pay | Admitting: Family Medicine

## 2017-01-09 ENCOUNTER — Telehealth: Payer: Self-pay | Admitting: Family Medicine

## 2017-01-09 DIAGNOSIS — R801 Persistent proteinuria, unspecified: Secondary | ICD-10-CM

## 2017-01-09 NOTE — Telephone Encounter (Signed)
Please call and let him know that we need to have him come in to get instructions and supplies to do a 24 urine collection since he has had protein in his urine. He can come in today or Monday for a lab visit.

## 2017-01-14 ENCOUNTER — Telehealth: Payer: Self-pay | Admitting: Internal Medicine

## 2017-01-14 NOTE — Telephone Encounter (Signed)
Pt will come in to pick up 24 hour urine on 1/24

## 2017-01-19 ENCOUNTER — Telehealth: Payer: Self-pay

## 2017-01-19 ENCOUNTER — Other Ambulatory Visit: Payer: PPO

## 2017-01-19 DIAGNOSIS — R801 Persistent proteinuria, unspecified: Secondary | ICD-10-CM | POA: Diagnosis not present

## 2017-01-19 NOTE — Telephone Encounter (Signed)
Records placed in your folder for review. /RLB  

## 2017-01-20 LAB — PROTEIN, URINE, 24 HOUR
PROTEIN 24H UR: 128 mg/(24.h) (ref <150)
PROTEIN, URINE: 16 mg/dL (ref 5–25)

## 2017-02-09 ENCOUNTER — Telehealth: Payer: Self-pay | Admitting: Neurology

## 2017-02-09 NOTE — Telephone Encounter (Signed)
Spoke to pt.  He picked up his prescription at [facility] for carbamazepine (generic).  He is concerned that it is not the same.  I told him that generic manufacturers can change.  Usually the pharmacy's call us if a change.   He has gotten previously at [facility].  He will check the bottle and see what manufacturer is on the bottle. I spoke to [place] is the manufacturer [facility] [id number].

## 2017-02-09 NOTE — Telephone Encounter (Signed)
Pt says carbamazepine (TEGRETOL XR) 400 MG 12 hr tablet looks different, he wants to make sure it is the same as before. He has changed pharmacy and was advised in October that it was a Risk analyst. Pt said he is ahead on his refills, he's had this bottle of medication since 10/17 and will start taking it in March. Pt needs confirmation from clinical staff that nothing has changed. Please call

## 2017-02-09 NOTE — Telephone Encounter (Signed)
Pt called back and he is not aware from looking on the bottle from [facility],  the manufacturer.  I told him that different pharmacy's have use different drugs.  Continue with using up [employer] and then switch to [facility], [employer] brand and know that there is a risk of sz activity with generic changes.   He stated that the pharmacist told this to him as well.  He verbalized  Understanding.  Will call back for more questions.

## 2017-02-16 ENCOUNTER — Encounter: Payer: Self-pay | Admitting: Family Medicine

## 2017-03-04 ENCOUNTER — Inpatient Hospital Stay (HOSPITAL_COMMUNITY)
Admission: EM | Admit: 2017-03-04 | Discharge: 2017-03-06 | DRG: 193 | Disposition: A | Discharge status: Home / Self Care | Payer: PPO | Attending: Internal Medicine | Admitting: Internal Medicine

## 2017-03-04 ENCOUNTER — Emergency Department (HOSPITAL_COMMUNITY): Payer: PPO

## 2017-03-04 ENCOUNTER — Encounter (HOSPITAL_COMMUNITY): Payer: Self-pay | Admitting: *Deleted

## 2017-03-04 DIAGNOSIS — G40409 Other generalized epilepsy and epileptic syndromes, not intractable, without status epilepticus: Secondary | ICD-10-CM | POA: Diagnosis present

## 2017-03-04 DIAGNOSIS — R05 Cough: Secondary | ICD-10-CM | POA: Diagnosis not present

## 2017-03-04 DIAGNOSIS — R509 Fever, unspecified: Secondary | ICD-10-CM | POA: Diagnosis not present

## 2017-03-04 DIAGNOSIS — J189 Pneumonia, unspecified organism: Secondary | ICD-10-CM | POA: Diagnosis not present

## 2017-03-04 DIAGNOSIS — G40309 Generalized idiopathic epilepsy and epileptic syndromes, not intractable, without status epilepticus: Secondary | ICD-10-CM | POA: Diagnosis not present

## 2017-03-04 DIAGNOSIS — Z8249 Family history of ischemic heart disease and other diseases of the circulatory system: Secondary | ICD-10-CM

## 2017-03-04 DIAGNOSIS — S065X9A Traumatic subdural hemorrhage with loss of consciousness of unspecified duration, initial encounter: Secondary | ICD-10-CM | POA: Diagnosis present

## 2017-03-04 DIAGNOSIS — J1000 Influenza due to other identified influenza virus with unspecified type of pneumonia: Principal | ICD-10-CM | POA: Diagnosis present

## 2017-03-04 DIAGNOSIS — J181 Lobar pneumonia, unspecified organism: Secondary | ICD-10-CM | POA: Diagnosis not present

## 2017-03-04 DIAGNOSIS — J9601 Acute respiratory failure with hypoxia: Secondary | ICD-10-CM | POA: Diagnosis present

## 2017-03-04 DIAGNOSIS — Z992 Dependence on renal dialysis: Secondary | ICD-10-CM

## 2017-03-04 DIAGNOSIS — Y95 Nosocomial condition: Secondary | ICD-10-CM | POA: Diagnosis not present

## 2017-03-04 DIAGNOSIS — S065XAA Traumatic subdural hemorrhage with loss of consciousness status unknown, initial encounter: Secondary | ICD-10-CM | POA: Diagnosis present

## 2017-03-04 DIAGNOSIS — N186 End stage renal disease: Secondary | ICD-10-CM | POA: Diagnosis not present

## 2017-03-04 DIAGNOSIS — Z933 Colostomy status: Secondary | ICD-10-CM

## 2017-03-04 DIAGNOSIS — J101 Influenza due to other identified influenza virus with other respiratory manifestations: Secondary | ICD-10-CM | POA: Diagnosis not present

## 2017-03-04 DIAGNOSIS — R0602 Shortness of breath: Secondary | ICD-10-CM | POA: Diagnosis not present

## 2017-03-04 LAB — COMPREHENSIVE METABOLIC PANEL
ALT: 21 U/L (ref 17–63)
ANION GAP: 10 (ref 5–15)
AST: 35 U/L (ref 15–41)
Albumin: 4 g/dL (ref 3.5–5.0)
Alkaline Phosphatase: 47 U/L (ref 38–126)
BILIRUBIN TOTAL: 0.9 mg/dL (ref 0.3–1.2)
BUN: 10 mg/dL (ref 6–20)
CHLORIDE: 107 mmol/L (ref 101–111)
CO2: 23 mmol/L (ref 22–32)
Calcium: 9 mg/dL (ref 8.9–10.3)
Creatinine, Ser: 1.16 mg/dL (ref 0.61–1.24)
Glucose, Bld: 104 mg/dL — ABNORMAL HIGH (ref 65–99)
POTASSIUM: 3.9 mmol/L (ref 3.5–5.1)
Sodium: 140 mmol/L (ref 135–145)
TOTAL PROTEIN: 7.5 g/dL (ref 6.5–8.1)

## 2017-03-04 LAB — URINALYSIS, ROUTINE W REFLEX MICROSCOPIC
BACTERIA UA: NONE SEEN
Bilirubin Urine: NEGATIVE
Glucose, UA: NEGATIVE mg/dL
Ketones, ur: NEGATIVE mg/dL
Leukocytes, UA: NEGATIVE
Nitrite: NEGATIVE
PH: 5 (ref 5.0–8.0)
Protein, ur: 100 mg/dL — AB
SPECIFIC GRAVITY, URINE: 1.032 — AB (ref 1.005–1.030)

## 2017-03-04 LAB — CBC WITH DIFFERENTIAL/PLATELET
Basophils Absolute: 0 10*3/uL (ref 0.0–0.1)
Basophils Relative: 0 %
EOS PCT: 1 %
Eosinophils Absolute: 0.1 10*3/uL (ref 0.0–0.7)
HEMATOCRIT: 43.1 % (ref 39.0–52.0)
Hemoglobin: 14.4 g/dL (ref 13.0–17.0)
LYMPHS ABS: 0.7 10*3/uL (ref 0.7–4.0)
LYMPHS PCT: 12 %
MCH: 30.3 pg (ref 26.0–34.0)
MCHC: 33.4 g/dL (ref 30.0–36.0)
MCV: 90.7 fL (ref 78.0–100.0)
MONO ABS: 0.7 10*3/uL (ref 0.1–1.0)
MONOS PCT: 13 %
NEUTROS ABS: 4 10*3/uL (ref 1.7–7.7)
Neutrophils Relative %: 74 %
PLATELETS: 144 10*3/uL — AB (ref 150–400)
RBC: 4.75 MIL/uL (ref 4.22–5.81)
RDW: 13.4 % (ref 11.5–15.5)
WBC: 5.4 10*3/uL (ref 4.0–10.5)

## 2017-03-04 LAB — I-STAT TROPONIN, ED: TROPONIN I, POC: 0 ng/mL (ref 0.00–0.08)

## 2017-03-04 LAB — INFLUENZA PANEL BY PCR (TYPE A & B)
INFLAPCR: POSITIVE — AB
INFLBPCR: NEGATIVE

## 2017-03-04 LAB — STREP PNEUMONIAE URINARY ANTIGEN: STREP PNEUMO URINARY ANTIGEN: NEGATIVE

## 2017-03-04 LAB — I-STAT CG4 LACTIC ACID, ED: LACTIC ACID, VENOUS: 0.89 mmol/L (ref 0.5–1.9)

## 2017-03-04 MED ORDER — CARBAMAZEPINE ER 200 MG PO TB12
400.0000 mg | ORAL_TABLET | Freq: Two times a day (BID) | ORAL | Status: DC
  Administered 2017-03-05 – 2017-03-06 (×4): 400 mg via ORAL
  Filled 2017-03-04 (×3): qty 1
  Filled 2017-03-04 (×2): qty 2

## 2017-03-04 MED ORDER — ENOXAPARIN SODIUM 40 MG/0.4ML ~~LOC~~ SOLN
40.0000 mg | SUBCUTANEOUS | Status: DC
  Administered 2017-03-04 – 2017-03-05 (×3): 40 mg via SUBCUTANEOUS
  Filled 2017-03-04 (×3): qty 0.4

## 2017-03-04 MED ORDER — ACETAMINOPHEN 325 MG PO TABS
650.0000 mg | ORAL_TABLET | Freq: Once | ORAL | Status: AC | PRN
  Administered 2017-03-04: 650 mg via ORAL

## 2017-03-04 MED ORDER — ACETAMINOPHEN 325 MG PO TABS
650.0000 mg | ORAL_TABLET | Freq: Once | ORAL | Status: AC
  Administered 2017-03-04: 650 mg via ORAL
  Filled 2017-03-04: qty 2

## 2017-03-04 MED ORDER — AZITHROMYCIN 500 MG IV SOLR
500.0000 mg | INTRAVENOUS | Status: DC
  Administered 2017-03-04: 500 mg via INTRAVENOUS
  Filled 2017-03-04 (×2): qty 500

## 2017-03-04 MED ORDER — SODIUM CHLORIDE 0.9 % IV BOLUS (SEPSIS)
1000.0000 mL | Freq: Once | INTRAVENOUS | Status: AC
  Administered 2017-03-04: 1000 mL via INTRAVENOUS

## 2017-03-04 MED ORDER — OSELTAMIVIR PHOSPHATE 75 MG PO CAPS
75.0000 mg | ORAL_CAPSULE | Freq: Two times a day (BID) | ORAL | Status: DC
Start: 2017-03-04 — End: 2017-03-06
  Administered 2017-03-04 – 2017-03-06 (×4): 75 mg via ORAL
  Filled 2017-03-04 (×4): qty 1

## 2017-03-04 MED ORDER — CEFTRIAXONE SODIUM 1 G IJ SOLR
1.0000 g | INTRAMUSCULAR | Status: DC
  Administered 2017-03-04 – 2017-03-06 (×3): 1 g via INTRAVENOUS
  Filled 2017-03-04 (×3): qty 10

## 2017-03-04 MED ORDER — SODIUM CHLORIDE 0.9 % IV BOLUS (SEPSIS)
500.0000 mL | Freq: Once | INTRAVENOUS | Status: AC
  Administered 2017-03-04: 500 mL via INTRAVENOUS

## 2017-03-04 MED ORDER — LEVETIRACETAM 500 MG PO TABS
1000.0000 mg | ORAL_TABLET | Freq: Two times a day (BID) | ORAL | Status: DC
  Administered 2017-03-04 – 2017-03-06 (×4): 1000 mg via ORAL
  Filled 2017-03-04 (×4): qty 2

## 2017-03-04 MED ORDER — SODIUM CHLORIDE 0.9 % IV SOLN
INTRAVENOUS | Status: DC
  Administered 2017-03-04 – 2017-03-06 (×4): via INTRAVENOUS

## 2017-03-04 MED ORDER — TAMSULOSIN HCL 0.4 MG PO CAPS
0.4000 mg | ORAL_CAPSULE | Freq: Every day | ORAL | Status: DC
  Administered 2017-03-04 – 2017-03-05 (×2): 0.4 mg via ORAL
  Filled 2017-03-04 (×2): qty 1

## 2017-03-04 MED ORDER — ACETAMINOPHEN 325 MG PO TABS
ORAL_TABLET | ORAL | Status: AC
  Filled 2017-03-04: qty 2

## 2017-03-04 NOTE — ED Notes (Signed)
IV team at bedside 
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
## 2017-03-04 NOTE — ED Notes (Signed)
Pt placed on 2L of oxygen for comfort
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
## 2017-03-04 NOTE — ED Notes (Signed)
Attempted IV x2 on Left side.

## 2017-03-04 NOTE — ED Notes (Signed)
Pt oxygen sat dropped to 85% on room air when sitting up on the side of bed.

## 2017-03-04 NOTE — ED Triage Notes (Addendum)
Pt arrived by [facility] for cough/fever x 2 days. Reports cough is productive with clear sputum.  Reports no relief with otc cough meds. Pt is febrile at triage.

## 2017-03-05 ENCOUNTER — Telehealth: Payer: Self-pay | Admitting: Neurology

## 2017-03-05 DIAGNOSIS — J101 Influenza due to other identified influenza virus with other respiratory manifestations: Secondary | ICD-10-CM

## 2017-03-05 LAB — CBC
HCT: 40.9 % (ref 39.0–52.0)
HEMOGLOBIN: 13.5 g/dL (ref 13.0–17.0)
MCH: 30.2 pg (ref 26.0–34.0)
MCHC: 33 g/dL (ref 30.0–36.0)
MCV: 91.5 fL (ref 78.0–100.0)
PLATELETS: 122 10*3/uL — AB (ref 150–400)
RBC: 4.47 MIL/uL (ref 4.22–5.81)
RDW: 13.7 % (ref 11.5–15.5)
WBC: 3.9 10*3/uL — ABNORMAL LOW (ref 4.0–10.5)

## 2017-03-05 LAB — COMPREHENSIVE METABOLIC PANEL
ALBUMIN: 3.4 g/dL — AB (ref 3.5–5.0)
ALK PHOS: 41 U/L (ref 38–126)
ALT: 19 U/L (ref 17–63)
AST: 38 U/L (ref 15–41)
Anion gap: 8 (ref 5–15)
BUN: 9 mg/dL (ref 6–20)
CHLORIDE: 106 mmol/L (ref 101–111)
CO2: 25 mmol/L (ref 22–32)
Calcium: 8.5 mg/dL — ABNORMAL LOW (ref 8.9–10.3)
Creatinine, Ser: 1.1 mg/dL (ref 0.61–1.24)
GFR calc non Af Amer: >60 mL/min (ref >60)
GLUCOSE: 93 mg/dL (ref 65–99)
POTASSIUM: 3.5 mmol/L (ref 3.5–5.1)
SODIUM: 139 mmol/L (ref 135–145)
Total Bilirubin: 0.6 mg/dL (ref 0.3–1.2)
Total Protein: 6.5 g/dL (ref 6.5–8.1)

## 2017-03-05 LAB — HIV ANTIBODY (ROUTINE TESTING W REFLEX): HIV Screen 4th Generation wRfx: NONREACTIVE

## 2017-03-05 MED ORDER — AZITHROMYCIN 500 MG PO TABS
500.0000 mg | ORAL_TABLET | Freq: Every day | ORAL | Status: DC
  Administered 2017-03-05 – 2017-03-06 (×2): 500 mg via ORAL
  Filled 2017-03-05 (×2): qty 1

## 2017-03-05 NOTE — Progress Notes (Addendum)
[facility] PROGRESS NOTE  Patrick Torres BMW:[id number] DOB: 01/10/1961 DOA: 03/04/2017   PCP: Harland Dingwall, NP-C     Assessment/Plan: Principal Problem:   CAP (community acquired pneumonia) Active Problems:   Generalized convulsive epilepsy ([place])   Subdural hematoma ([place])   Influenza A   Pneumonia   56 year old male with a history of seizure disorder, traumatic brain injury, subdural hematoma, mild cognitive impairment, who presents to the ER today with chief complaint cough, fever 2 days. The cough is productive with yellow sputum. Patient also has had some pleuritic chest pain, which started yesterday. Patient admitted for hypoxemic respiratory failure and pneumonia    Assessment and plan CAP (community acquired pneumonia) Pneumonia focused order set has been initiated Continue Rocephin/azithromycin, day #2, still needing oxygen and respiratory status still not at baseline   HIV antibody -pending,   Urine antigen for strep pneumonia -negative Blood culture 2, no growth so far Wean oxygen as able Supportive therapy with nebulizer treatments  Influenza A Patient has been started on Tamiflu 75 mg twice a day for 5 days, day #2    Generalized convulsive epilepsy (HCC)-no history of recent seizures Continue Tegretol, Keppra,  Subdural hematoma (HCC)-fall precautions      DVT prophylaxsis  Lovenox  Code Status:  Full code    Family Communication: Discussed in detail with the patient, all imaging results, lab results explained to the patient   Disposition Plan: Anticipate discharge tomorrow      Consultants:  None  Procedures:  None  Antibiotics: Anti-infectives    Start     Dose/Rate Route Frequency Ordered Stop   03/04/17 1615  oseltamivir (TAMIFLU) capsule 75 mg     75 mg Oral 2 times daily 03/04/17 1559 03/09/17 2159   03/04/17 1215  cefTRIAXone (ROCEPHIN) 1 g in dextrose 5 % 50 mL IVPB     1 g 100 mL/hr over 30 Minutes  Intravenous Every 24 hours 03/04/17 1208 03/11/17 1214   03/04/17 1215  azithromycin (ZITHROMAX) 500 mg in dextrose 5 % 250 mL IVPB     500 mg 250 mL/hr over 60 Minutes Intravenous Every 24 hours 03/04/17 1208 03/11/17 1214         HPI/Subjective: Febrile , but less sob now , denies any chest pain  Objective: Vitals:   03/04/17 1615 03/04/17 2018 03/04/17 2043 03/05/17 0514  BP: 154/90  (!) 153/91 132/82  Pulse: 78  78 79  Resp: 25  17 18   Temp:  101.5 F (38.6 C) (!) 101.5 F (38.6 C) 99.8 F (37.7 C)  TempSrc:  Oral Oral Oral  SpO2: 94%  99% 95%  Weight:   99.3 kg (218 lb 14.7 oz)   Height:   5\' 8"  (1.727 m)     Intake/Output Summary (Last 24 hours) at 03/05/17 0924 Last data filed at 03/05/17 0604  Gross per 24 hour  Intake          2318.75 ml  Output              300 ml  Net          2018.75 ml    Exam:  Examination:  General exam: Appears calm and comfortable  Respiratory system: Clear to auscultation. Respiratory effort normal. Cardiovascular system: S1 & S2 heard, RRR. No JVD, murmurs, rubs, gallops or clicks. No pedal edema. Gastrointestinal system: Abdomen is nondistended, soft and nontender. No organomegaly or masses felt. Normal bowel sounds heard. Central nervous  system: Alert and oriented. No focal neurological deficits. Extremities: Symmetric 5 x 5 power. Skin: No rashes, lesions or ulcers Psychiatry: Judgement and insight appear normal. Mood & affect appropriate.     Data Reviewed: I have personally reviewed following labs and imaging studies  Micro Results No results found for this or any previous visit (from the past 240 hour(s)).  Radiology Reports Dg Chest 2 View  Result Date: 03/04/2017 CLINICAL DATA:  Cough, shortness of breath and body aches for several days. History of encephalopathy and seizure disorder, nonsmoker. EXAM: CHEST  2 VIEW COMPARISON:  Portable chest x-ray of November 26, 2014 FINDINGS: The lungs are reasonably well  inflated. There is no focal infiltrate. There is no pleural effusion. The interstitial markings are coarse greatest on the right inferiorly. The cardiac silhouette is enlarged. The pulmonary vascularity is normal. There is calcification in the wall of the aortic arch. The trachea is midline. The bony thorax exhibits no acute abnormality. IMPRESSION: Coarse lung markings in the right infrahilar region posteriorly suggests atelectasis or early interstitial pneumonia. Followup PA and lateral chest X-ray is recommended in 3-4 weeks following trial of antibiotic therapy to ensure resolution and exclude underlying malignancy. Thoracic aortic atherosclerosis. Electronically Signed   By: David  Martinique M.D.   On: 03/04/2017 11:21     CBC  Recent Labs Lab 03/04/17 1335 03/05/17 0539  WBC 5.4 3.9*  HGB 14.4 13.5  HCT 43.1 40.9  PLT 144* 122*  MCV 90.7 91.5  MCH 30.3 30.2  MCHC 33.4 33.0  RDW 13.4 13.7  LYMPHSABS 0.7  --   MONOABS 0.7  --   EOSABS 0.1  --   BASOSABS 0.0  --     Chemistries   Recent Labs Lab 03/04/17 1335 03/05/17 0539  NA 140 139  K 3.9 3.5  CL 107 106  CO2 23 25  GLUCOSE 104* 93  BUN 10 9  CREATININE 1.16 1.10  CALCIUM 9.0 8.5*  AST 35 38  ALT 21 19  ALKPHOS 47 41  BILITOT 0.9 0.6   ------------------------------------------------------------------------------------------------------------------ estimated creatinine clearance is 84.7 mL/min (by C-G formula based on SCr of 1.1 mg/dL). ------------------------------------------------------------------------------------------------------------------ No results for input(s): HGBA1C in the last 72 hours. ------------------------------------------------------------------------------------------------------------------ No results for input(s): CHOL, HDL, LDLCALC, TRIG, CHOLHDL, LDLDIRECT in the last 72  hours. ------------------------------------------------------------------------------------------------------------------ No results for input(s): TSH, T4TOTAL, T3FREE, THYROIDAB in the last 72 hours.  Invalid input(s): FREET3 ------------------------------------------------------------------------------------------------------------------ No results for input(s): VITAMINB12, FOLATE, FERRITIN, TIBC, IRON, RETICCTPCT in the last 72 hours.  Coagulation profile No results for input(s): INR, PROTIME in the last 168 hours.  No results for input(s): DDIMER in the last 72 hours.  Cardiac Enzymes No results for input(s): CKMB, TROPONINI, MYOGLOBIN in the last 168 hours.  Invalid input(s): CK ------------------------------------------------------------------------------------------------------------------ Invalid input(s): POCBNP   CBG: No results for input(s): GLUCAP in the last 168 hours.     Studies: Dg Chest 2 View  Result Date: 03/04/2017 CLINICAL DATA:  Cough, shortness of breath and body aches for several days. History of encephalopathy and seizure disorder, nonsmoker. EXAM: CHEST  2 VIEW COMPARISON:  Portable chest x-ray of November 26, 2014 FINDINGS: The lungs are reasonably well inflated. There is no focal infiltrate. There is no pleural effusion. The interstitial markings are coarse greatest on the right inferiorly. The cardiac silhouette is enlarged. The pulmonary vascularity is normal. There is calcification in the wall of the aortic arch. The trachea is midline. The bony thorax exhibits no acute abnormality. IMPRESSION: Coarse  lung markings in the right infrahilar region posteriorly suggests atelectasis or early interstitial pneumonia. Followup PA and lateral chest X-ray is recommended in 3-4 weeks following trial of antibiotic therapy to ensure resolution and exclude underlying malignancy. Thoracic aortic atherosclerosis. Electronically Signed   By: David  Martinique M.D.   On:  03/04/2017 11:21      Lab Results  Component Value Date   HGBA1C 5.7 (H) 03/29/2012   Lab Results  Component Value Date   LDLCALC 109 (H) 03/29/2012   CREATININE 1.10 03/05/2017       Scheduled Meds: . azithromycin  500 mg Intravenous Q24H  . carbamazepine  400 mg Oral BID  . cefTRIAXone (ROCEPHIN)  IV  1 g Intravenous Q24H  . enoxaparin (LOVENOX) injection  40 mg Subcutaneous Q24H  . levETIRAcetam  1,000 mg Oral BID  . oseltamivir  75 mg Oral BID  . tamsulosin  0.4 mg Oral QPC supper   Continuous Infusions: . sodium chloride 75 mL/hr at 03/05/17 0111     LOS: 1 day    Time spent: >30 MINS    [facility]  [facility] Pager [phone]. If 7PM-7AM, please contact night-coverage at [email or web address], password [facility] 03/05/2017, 9:24 AM  LOS: 1 day

## 2017-03-05 NOTE — Telephone Encounter (Signed)
Rn call Erline Levine at Dr .Chase Caller office. Dr. Chase Caller has consulted and spoke with Dr. Leonie Man today concerning pt and research trial.This note will be closed.

## 2017-03-05 NOTE — Telephone Encounter (Signed)
[place] Board (with Pulmonary Research Dept from Dr office of Dr Chase Caller) called inquiring if this pt is eligible for a Clinical trial for the flu.  Mar[place] Board can be reached at [phone]

## 2017-03-05 NOTE — Progress Notes (Signed)
Patient requested to have keppra & tegretol given same time as he takes it at home (8am &11pm). Pharmacy notified.

## 2017-03-05 NOTE — Progress Notes (Signed)
New Admission Note:  Arrival Method: On stretch from ED Mental Orientation: Alert and Oriented X4 Telemetry: On. CCMD verified.  Assessment: Completed Skin: Assessed, refer to Flowsheet IV: Left AC Pain: 0/10 Tubes: None Safety Measures: Safety Fall Prevention Plan discussed with patient. Admission: Completed [address] Orientation: Patient has been orientated to the room, unit and the staff.  Orders have been reviewed and implemented. Will continue to monitor the patient. Call light has been placed within reach and bed alarm has been activated.   Vassie Moselle, RN  Phone Number: [phone]

## 2017-03-05 NOTE — Progress Notes (Signed)
PT Cancellation Note  Patient Details Name: ABDULMALIK DARCO MRN: [id number] DOB: December 25, 1960   Cancelled Treatment:    Reason Eval/Treat Not Completed: Patient declined, no reason specified .  Pt declined evaluation stating he was fine and independent.  Will sign off. 03/05/2017  Donnella Sham, [place] [phone]  (pager)  Tessie Fass Arleigh Odowd 03/05/2017, 5:51 PM

## 2017-03-06 MED ORDER — AZITHROMYCIN 500 MG PO TABS: 500.0000 mg | ORAL_TABLET | Freq: Every day | ORAL | 0 refills | Status: AC

## 2017-03-06 MED ORDER — CEFDINIR 300 MG PO CAPS: 300.0000 mg | ORAL_CAPSULE | Freq: Two times a day (BID) | ORAL | 0 refills | Status: AC

## 2017-03-06 MED ORDER — OSELTAMIVIR PHOSPHATE 75 MG PO CAPS: 75.0000 mg | ORAL_CAPSULE | Freq: Two times a day (BID) | ORAL | 0 refills | Status: AC

## 2017-03-06 NOTE — Progress Notes (Signed)
Patient called me while I was in the isolation room with a a patient,reception was very bed.I requested him to give his number([phone] ) I called him back three times but this number was just keep on ringing.I called his contact number,Torita, but she said its a wrong number,I tried his sister cell phone number and left a voicemail messages.I called Enid Derry but the phone just keep ringing.I asked unit secretary the ,he call us back here,get his number and transfer his call to me.

## 2017-03-06 NOTE — Discharge Summary (Signed)
Physician Discharge Summary  Patrick Torres MRN: [id number] DOB/AGE: 58-Sep-1962 58 y.o.  PCP: Harland Dingwall, NP-C   Admit date: 03/04/2017 Discharge date: 03/06/2017  Discharge Diagnoses:    Principal Problem:   CAP (community acquired pneumonia) Active Problems:   Generalized convulsive epilepsy ([place])   Subdural hematoma ([place])   Influenza A   Pneumonia    Follow-up recommendations Follow-up with PCP in 3-5 days , including all  additional recommended appointments as below Follow-up CBC, CMP in 3-5 days       Current Discharge Medication List    START taking these medications   Details  azithromycin (ZITHROMAX) 500 MG tablet Take 1 tablet (500 mg total) by mouth daily. Qty: 5 tablet, Refills: 0    cefdinir (OMNICEF) 300 MG capsule Take 1 capsule (300 mg total) by mouth 2 (two) times daily. Qty: 10 capsule, Refills: 0    oseltamivir (TAMIFLU) 75 MG capsule Take 1 capsule (75 mg total) by mouth 2 (two) times daily. Qty: 6 capsule, Refills: 0      CONTINUE these medications which have NOT CHANGED   Details  carbamazepine (TEGRETOL XR) 400 MG 12 hr tablet Take 1 tablet (400 mg total) by mouth 2 (two) times daily. Qty: 180 tablet, Refills: 3    levETIRAcetam (KEPPRA) 1000 MG tablet Take 1 tablet (1,000 mg total) by mouth 2 (two) times daily. Qty: 180 tablet, Refills: 3    Multiple Vitamin (MULTIVITAMIN) capsule Take 1 capsule by mouth daily.    tamsulosin (FLOMAX) 0.4 MG CAPS capsule Take 0.4 mg by mouth daily after supper.         Discharge Condition:  Stable   Discharge Instructions Get Medicines reviewed and adjusted: Please take all your medications with you for your next visit with your Primary MD  Please request your Primary MD to go over all hospital tests and procedure/radiological results at the follow up, please ask your Primary MD to get all Hospital records sent to his/her office.  If you experience worsening of your admission symptoms,  develop shortness of breath, life threatening emergency, suicidal or homicidal thoughts you must seek medical attention immediately by calling 911 or calling your MD immediately if symptoms less severe.  You must read complete instructions/literature along with all the possible adverse reactions/side effects for all the Medicines you take and that have been prescribed to you. Take any new Medicines after you have completely understood and accpet all the possible adverse reactions/side effects.   Do not drive when taking Pain medications.   Do not take more than prescribed Pain, Sleep and Anxiety Medications  Special Instructions: If you have smoked or chewed Tobacco in the last 2 yrs please stop smoking, stop any regular Alcohol and or any Recreational drug use.  Wear Seat belts while driving.  Please note  You were cared for by a hospitalist during your hospital stay. Once you are discharged, your primary care physician will handle any further medical issues. Please note that NO REFILLS for any discharge medications will be authorized once you are discharged, as it is imperative that you return to your primary care physician (or establish a relationship with a primary care physician if you do not have one) for your aftercare needs so that they can reassess your need for medications and monitor your lab values.     No Known Allergies    Disposition: 01-Home or Self Care   Consults:      Significant Diagnostic Studies:  Dg Chest 2  View  Result Date: 03/04/2017 CLINICAL DATA:  Cough, shortness of breath and body aches for several days. History of encephalopathy and seizure disorder, nonsmoker. EXAM: CHEST  2 VIEW COMPARISON:  Portable chest x-ray of November 26, 2014 FINDINGS: The lungs are reasonably well inflated. There is no focal infiltrate. There is no pleural effusion. The interstitial markings are coarse greatest on the right inferiorly. The cardiac silhouette is enlarged. The  pulmonary vascularity is normal. There is calcification in the wall of the aortic arch. The trachea is midline. The bony thorax exhibits no acute abnormality. IMPRESSION: Coarse lung markings in the right infrahilar region posteriorly suggests atelectasis or early interstitial pneumonia. Followup PA and lateral chest X-ray is recommended in 3-4 weeks following trial of antibiotic therapy to ensure resolution and exclude underlying malignancy. Thoracic aortic atherosclerosis. Electronically Signed   By: David  Martinique M.D.   On: 03/04/2017 11:21      Filed Weights   03/04/17 1142 03/04/17 2043 03/05/17 2215  Weight: 99.8 kg (220 lb) 99.3 kg (218 lb 14.7 oz) 99.6 kg (219 lb 9.3 oz)     Microbiology: Recent Results (from the past 240 hour(s))  Culture, blood (routine x 2)     Status: None (Preliminary result)   Collection Time: 03/04/17 12:21 PM  Result Value Ref Range Status   Specimen Description BLOOD RIGHT ANTECUBITAL  Final   Special Requests IN PEDIATRIC BOTTLE 2CC  Final   Culture NO GROWTH 1 DAY  Final   Report Status PENDING  Incomplete  Culture, blood (routine x 2)     Status: None (Preliminary result)   Collection Time: 03/04/17 12:49 PM  Result Value Ref Range Status   Specimen Description BLOOD LEFT HAND  Final   Special Requests IN PEDIATRIC BOTTLE 1CC  Final   Culture NO GROWTH 1 DAY  Final   Report Status PENDING  Incomplete       Blood Culture    Component Value Date/Time   SDES BLOOD LEFT HAND 03/04/2017 1249   SPECREQUEST IN PEDIATRIC BOTTLE 1CC 03/04/2017 1249   CULT NO GROWTH 1 DAY 03/04/2017 1249   REPTSTATUS PENDING 03/04/2017 1249      Labs: Results for orders placed or performed during the hospital encounter of 03/04/17 (from the past 48 hour(s))  Culture, blood (routine x 2)     Status: None (Preliminary result)   Collection Time: 03/04/17 12:21 PM  Result Value Ref Range   Specimen Description BLOOD RIGHT ANTECUBITAL    Special Requests IN  PEDIATRIC BOTTLE 2CC    Culture NO GROWTH 1 DAY    Report Status PENDING   Influenza panel by PCR (type A & B)     Status: Abnormal   Collection Time: 03/04/17 12:47 PM  Result Value Ref Range   Influenza A By PCR POSITIVE (A) NEGATIVE   Influenza B By PCR NEGATIVE NEGATIVE    Comment: (NOTE) The Xpert Xpress Flu assay is intended as an aid in the diagnosis of  influenza and should not be used as a sole basis for treatment.  This  assay is FDA approved for nasopharyngeal swab specimens only. Nasal  washings and aspirates are unacceptable for Xpert Xpress Flu testing.   Culture, blood (routine x 2)     Status: None (Preliminary result)   Collection Time: 03/04/17 12:49 PM  Result Value Ref Range   Specimen Description BLOOD LEFT HAND    Special Requests IN PEDIATRIC BOTTLE 1CC    Culture NO GROWTH 1  DAY    Report Status PENDING   Urinalysis, Routine w reflex microscopic     Status: Abnormal   Collection Time: 03/04/17  1:13 PM  Result Value Ref Range   Color, Urine AMBER (A) YELLOW    Comment: BIOCHEMICALS MAY BE AFFECTED BY COLOR   APPearance CLEAR CLEAR   Specific Gravity, Urine 1.032 (H) 1.005 - 1.030   pH 5.0 5.0 - 8.0   Glucose, UA NEGATIVE NEGATIVE mg/dL   Hgb urine dipstick MODERATE (A) NEGATIVE   Bilirubin Urine NEGATIVE NEGATIVE   Ketones, ur NEGATIVE NEGATIVE mg/dL   Protein, ur 100 (A) NEGATIVE mg/dL   Nitrite NEGATIVE NEGATIVE   Leukocytes, UA NEGATIVE NEGATIVE   RBC / HPF 0-5 0 - 5 RBC/hpf   WBC, UA 0-5 0 - 5 WBC/hpf   Bacteria, UA NONE SEEN NONE SEEN   Squamous Epithelial / LPF 0-5 (A) NONE SEEN   Mucous PRESENT   Strep pneumoniae urinary antigen     Status: None   Collection Time: 03/04/17  1:13 PM  Result Value Ref Range   Strep Pneumo Urinary Antigen NEGATIVE NEGATIVE    Comment:        Infection due to S. pneumoniae cannot be absolutely ruled out since the antigen present may be below the detection limit of the test.   CBC with Differential      Status: Abnormal   Collection Time: 03/04/17  1:35 PM  Result Value Ref Range   WBC 5.4 4.0 - 10.5 K/uL   RBC 4.75 4.22 - 5.81 MIL/uL   Hemoglobin 14.4 13.0 - 17.0 g/dL   HCT 43.1 39.0 - 52.0 %   MCV 90.7 78.0 - 100.0 fL   MCH 30.3 26.0 - 34.0 pg   MCHC 33.4 30.0 - 36.0 g/dL   RDW 13.4 11.5 - 15.5 %   Platelets 144 (L) 150 - 400 K/uL   Neutrophils Relative % 74 %   Neutro Abs 4.0 1.7 - 7.7 K/uL   Lymphocytes Relative 12 %   Lymphs Abs 0.7 0.7 - 4.0 K/uL   Monocytes Relative 13 %   Monocytes Absolute 0.7 0.1 - 1.0 K/uL   Eosinophils Relative 1 %   Eosinophils Absolute 0.1 0.0 - 0.7 K/uL   Basophils Relative 0 %   Basophils Absolute 0.0 0.0 - 0.1 K/uL  Comprehensive metabolic panel     Status: Abnormal   Collection Time: 03/04/17  1:35 PM  Result Value Ref Range   Sodium 140 135 - 145 mmol/L   Potassium 3.9 3.5 - 5.1 mmol/L    Comment: SLIGHT HEMOLYSIS   Chloride 107 101 - 111 mmol/L   CO2 23 22 - 32 mmol/L   Glucose, Bld 104 (H) 65 - 99 mg/dL   BUN 10 6 - 20 mg/dL   Creatinine, Ser 1.16 0.61 - 1.24 mg/dL   Calcium 9.0 8.9 - 10.3 mg/dL   Total Protein 7.5 6.5 - 8.1 g/dL   Albumin 4.0 3.5 - 5.0 g/dL   AST 35 15 - 41 U/L   ALT 21 17 - 63 U/L   Alkaline Phosphatase 47 38 - 126 U/L   Total Bilirubin 0.9 0.3 - 1.2 mg/dL   GFR calc non Af Amer >60 >60 mL/min   GFR calc Af Amer >60 >60 mL/min    Comment: (NOTE) The eGFR has been calculated using the CKD EPI equation. This calculation has not been validated in all clinical situations. eGFR's persistently <60 mL/min signify possible Chronic Kidney  Disease.    Anion gap 10 5 - 15  I-stat troponin, ED     Status: None   Collection Time: 03/04/17  1:43 PM  Result Value Ref Range   Troponin i, poc 0.00 0.00 - 0.08 ng/mL   Comment 3            Comment: Due to the release kinetics of cTnI, a negative result within the first hours of the onset of symptoms does not rule out myocardial infarction with certainty. If myocardial  infarction is still suspected, repeat the test at appropriate intervals.   I-Stat CG4 Lactic Acid, ED     Status: None   Collection Time: 03/04/17  3:36 PM  Result Value Ref Range   Lactic Acid, Venous 0.89 0.5 - 1.9 mmol/L  CBC     Status: Abnormal   Collection Time: 03/05/17  5:39 AM  Result Value Ref Range   WBC 3.9 (L) 4.0 - 10.5 K/uL   RBC 4.47 4.22 - 5.81 MIL/uL   Hemoglobin 13.5 13.0 - 17.0 g/dL   HCT 40.9 39.0 - 52.0 %   MCV 91.5 78.0 - 100.0 fL   MCH 30.2 26.0 - 34.0 pg   MCHC 33.0 30.0 - 36.0 g/dL   RDW 13.7 11.5 - 15.5 %   Platelets 122 (L) 150 - 400 K/uL  Comprehensive metabolic panel     Status: Abnormal   Collection Time: 03/05/17  5:39 AM  Result Value Ref Range   Sodium 139 135 - 145 mmol/L   Potassium 3.5 3.5 - 5.1 mmol/L   Chloride 106 101 - 111 mmol/L   CO2 25 22 - 32 mmol/L   Glucose, Bld 93 65 - 99 mg/dL   BUN 9 6 - 20 mg/dL   Creatinine, Ser 1.10 0.61 - 1.24 mg/dL   Calcium 8.5 (L) 8.9 - 10.3 mg/dL   Total Protein 6.5 6.5 - 8.1 g/dL   Albumin 3.4 (L) 3.5 - 5.0 g/dL   AST 38 15 - 41 U/L   ALT 19 17 - 63 U/L   Alkaline Phosphatase 41 38 - 126 U/L   Total Bilirubin 0.6 0.3 - 1.2 mg/dL   GFR calc non Af Amer >60 >60 mL/min   GFR calc Af Amer >60 >60 mL/min    Comment: (NOTE) The eGFR has been calculated using the CKD EPI equation. This calculation has not been validated in all clinical situations. eGFR's persistently <60 mL/min signify possible Chronic Kidney Disease.    Anion gap 8 5 - 15  HIV antibody     Status: None   Collection Time: 03/05/17  5:39 AM  Result Value Ref Range   HIV Screen 4th Generation wRfx Non Reactive Non Reactive    Comment: (NOTE) Performed At: [facility] [place], [place] [id number] Lindon Romp MD IH:[id number]      Lipid Panel     Component Value Date/Time   CHOL 189 03/29/2012 1100   TRIG 63 03/29/2012 1100   HDL 67 03/29/2012 1100   CHOLHDL 2.8 03/29/2012 1100   VLDL 13  03/29/2012 1100   LDLCALC 109 (H) 03/29/2012 1100     Lab Results  Component Value Date   HGBA1C 5.7 (H) 03/29/2012     Lab Results  Component Value Date   LDLCALC 109 (H) 03/29/2012   CREATININE 1.10 03/05/2017     HPI :  56 year old male with a history of seizure disorder, traumatic brain injury, subdural hematoma, mild cognitive impairment,  who presents to the ER today with chief complaint cough, fever 2 days. The cough is productive with yellow sputum. Patient also has had some pleuritic chest pain, which started yesterday. He denies any sick contacts. Denies any recent history of travel ED course He was found to be 85% on room air, and was placed on 2 L of oxygen 142/97  Pulse 81  Temp 102.9 F (39.4 C) (Oral)  Resp (!) 27  Influenza A+, WBC 5.4, trop negative, CXR with possible early interstitial pna. Given fever and decreased O2, patient received Rocephin, azithromycin, Tamiflu and admitted for hypoxic respiratory failure and tenuous respiratory status  HOSPITAL COURSE:   CAP (community acquired pneumonia) Pneumonia focused order set has been initiated Continue Rocephin/azithromycin, day #3  Patient has now been switched to azithromycin and Omnicef 5 days HIV nonreactive   Urine antigen for strep pneumonia -negative Blood culture 2, no growth so far Wean oxygen as able Supportive therapy with nebulizer treatments  Influenza A Patient has been started on Tamiflu 75 mg twice a day for 5 days total, continue for another 3 days    Generalized convulsive epilepsy (HCC)-no history of recent seizures Continue Tegretol, Keppra,  Subdural hematoma (HCC)-fall precautions   Discharge Exam:   Blood pressure (!) 147/82, pulse 70, temperature 98.6 F (37 C), temperature source Oral, resp. rate 19, height _0  (1.727 m), weight 99.6 kg (219 lb 9.3 oz), SpO2 96 %.  Constitutional: NAD, calm, comfortable Eyes: PERRL, lids and conjunctivae normal ENMT: Mucous  membranes are moist. Posterior pharynx clear of any exudate or lesions.Normal dentition.  Neck: normal, supple, no masses, no thyromegaly Pulmonary/Chest: Effort normal. He has decreased breath soundsin the right lower fieldand the left lower field. He has no wheezes. He has no rhonchi. He has no rales.   Cardiovascular: Regular rate and rhythm, no murmurs / rubs / gallops. No extremity edema. 2+ pedal pulses. No carotid bruits.  Abdomen: no tenderness, no masses palpated. No hepatosplenomegaly. Bowel sounds positive.  Musculoskeletal: no clubbing / cyanosis. No joint deformity upper and lower extremities. Good ROM, no contractures. Normal muscle tone.  Skin: no rashes, lesions, ulcers. No induration    Follow-up Information    Harland Dingwall, NP-C. Call.   Specialty:  Family Medicine Why:  Hospital follow-up in 3-5 days Contact information: [place] [address] [phone]           Signed: Reyne Dumas 03/06/2017, 9:37 AM        Time spent >45 mins

## 2017-03-09 ENCOUNTER — Encounter: Payer: Self-pay | Admitting: Family Medicine

## 2017-03-09 ENCOUNTER — Ambulatory Visit (INDEPENDENT_AMBULATORY_CARE_PROVIDER_SITE_OTHER): Payer: PPO | Admitting: Family Medicine

## 2017-03-09 VITALS — BP 110/70 | HR 62 | Temp 97.9°F | Resp 16 | Wt 198.8 lb

## 2017-03-09 DIAGNOSIS — J101 Influenza due to other identified influenza virus with other respiratory manifestations: Secondary | ICD-10-CM

## 2017-03-09 DIAGNOSIS — J189 Pneumonia, unspecified organism: Secondary | ICD-10-CM

## 2017-03-09 DIAGNOSIS — I700 Atherosclerosis of aorta: Secondary | ICD-10-CM | POA: Insufficient documentation

## 2017-03-09 DIAGNOSIS — Z09 Encounter for follow-up examination after completed treatment for conditions other than malignant neoplasm: Secondary | ICD-10-CM | POA: Diagnosis not present

## 2017-03-09 LAB — COMPREHENSIVE METABOLIC PANEL
ALK PHOS: 45 U/L (ref 40–115)
ALT: 46 U/L (ref 9–46)
AST: 33 U/L (ref 10–35)
Albumin: 4 g/dL (ref 3.6–5.1)
BILIRUBIN TOTAL: 0.6 mg/dL (ref 0.2–1.2)
BUN: 9 mg/dL (ref 7–25)
CO2: 24 mmol/L (ref 20–31)
Calcium: 9 mg/dL (ref 8.6–10.3)
Chloride: 108 mmol/L (ref 98–110)
Creat: 0.78 mg/dL (ref 0.70–1.33)
GLUCOSE: 100 mg/dL — AB (ref 65–99)
Potassium: 4 mmol/L (ref 3.5–5.3)
Sodium: 138 mmol/L (ref 135–146)
Total Protein: 7 g/dL (ref 6.1–8.1)

## 2017-03-09 LAB — CULTURE, BLOOD (ROUTINE X 2)
CULTURE: NO GROWTH
Culture: NO GROWTH

## 2017-03-09 NOTE — Patient Instructions (Signed)
Continue to rest, hydrate and take good care of yourself. Take good deep breaths several times per day to expand your lungs as discussed.  Complete all antibiotics as prescribed.   If you develop chest pain, fever, shortness of breath then you will need to call or return or go to the emergency department. I expect that you will continue to improve and not get any worse.   Return in 4 weeks. We will repeat a chest XR at that time.
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
## 2017-04-06 ENCOUNTER — Ambulatory Visit (INDEPENDENT_AMBULATORY_CARE_PROVIDER_SITE_OTHER): Payer: PPO | Admitting: Family Medicine

## 2017-04-06 ENCOUNTER — Encounter: Payer: Self-pay | Admitting: Family Medicine

## 2017-04-06 VITALS — BP 124/80 | HR 56 | Ht 67.75 in | Wt 223.0 lb

## 2017-04-06 DIAGNOSIS — J189 Pneumonia, unspecified organism: Secondary | ICD-10-CM

## 2017-04-06 DIAGNOSIS — I700 Atherosclerosis of aorta: Secondary | ICD-10-CM | POA: Diagnosis not present

## 2017-04-06 DIAGNOSIS — Z1159 Encounter for screening for other viral diseases: Secondary | ICD-10-CM | POA: Diagnosis not present

## 2017-04-06 DIAGNOSIS — G40909 Epilepsy, unspecified, not intractable, without status epilepticus: Secondary | ICD-10-CM

## 2017-04-06 DIAGNOSIS — Z79899 Other long term (current) drug therapy: Secondary | ICD-10-CM

## 2017-04-06 DIAGNOSIS — Z0000 Encounter for general adult medical examination without abnormal findings: Secondary | ICD-10-CM | POA: Diagnosis not present

## 2017-04-06 LAB — POCT URINALYSIS DIPSTICK
BILIRUBIN UA: NEGATIVE
Blood, UA: NEGATIVE
GLUCOSE UA: NEGATIVE
Ketones, UA: NEGATIVE
LEUKOCYTES UA: NEGATIVE
NITRITE UA: NEGATIVE
PH UA: 6 (ref 5.0–8.0)
Spec Grav, UA: >=1.03 — AB (ref 1.010–1.025)
Urobilinogen, UA: NEGATIVE E.U./dL — AB

## 2017-04-06 LAB — COMPREHENSIVE METABOLIC PANEL
ALBUMIN: 3.7 g/dL (ref 3.6–5.1)
ALT: 13 U/L (ref 9–46)
AST: 14 U/L (ref 10–35)
Alkaline Phosphatase: 47 U/L (ref 40–115)
BILIRUBIN TOTAL: 0.4 mg/dL (ref 0.2–1.2)
BUN: 9 mg/dL (ref 7–25)
CHLORIDE: 107 mmol/L (ref 98–110)
CO2: 24 mmol/L (ref 20–31)
Calcium: 9.1 mg/dL (ref 8.6–10.3)
Creat: 0.84 mg/dL (ref 0.70–1.33)
Glucose, Bld: 95 mg/dL (ref 65–99)
Potassium: 4.1 mmol/L (ref 3.5–5.3)
Sodium: 138 mmol/L (ref 135–146)
Total Protein: 7 g/dL (ref 6.1–8.1)

## 2017-04-06 LAB — CBC WITH DIFFERENTIAL/PLATELET
Basophils Absolute: 44 cells/uL (ref 0–200)
Basophils Relative: 1 %
EOS ABS: 176 {cells}/uL (ref 15–500)
Eosinophils Relative: 4 %
HEMATOCRIT: 43 % (ref 38.5–50.0)
HEMOGLOBIN: 14.7 g/dL (ref 13.2–17.1)
Lymphocytes Relative: 42 %
Lymphs Abs: 1848 cells/uL (ref 850–3900)
MCH: 30.8 pg (ref 27.0–33.0)
MCHC: 34.2 g/dL (ref 32.0–36.0)
MCV: 90 fL (ref 80.0–100.0)
MONO ABS: 572 {cells}/uL (ref 200–950)
MPV: 11 fL (ref 7.5–12.5)
Monocytes Relative: 13 %
NEUTROS ABS: 1760 {cells}/uL (ref 1500–7800)
NEUTROS PCT: 40 %
Platelets: 151 10*3/uL (ref 140–400)
RBC: 4.78 MIL/uL (ref 4.20–5.80)
RDW: 14.2 % (ref 11.0–15.0)
WBC: 4.4 10*3/uL (ref 4.0–10.5)

## 2017-04-06 LAB — LIPID PANEL
Cholesterol: 192 mg/dL (ref <200)
HDL: 59 mg/dL (ref >40)
LDL Cholesterol: 108 mg/dL — ABNORMAL HIGH (ref <100)
TRIGLYCERIDES: 126 mg/dL (ref <150)
Total CHOL/HDL Ratio: 3.3 Ratio (ref <5.0)
VLDL: 25 mg/dL (ref <30)

## 2017-04-06 MED ORDER — ATORVASTATIN CALCIUM 20 MG PO TABS: 20.0000 mg | ORAL_TABLET | Freq: Every day | ORAL | 3 refills | Status: DC

## 2017-04-06 NOTE — Patient Instructions (Signed)
We will call you with your lab and XR result.

## 2017-04-06 NOTE — Progress Notes (Addendum)
Patrick Torres is a 56 y.o. male who presents for annual wellness visit and follow-up on chronic medical conditions.  He has the following concerns:  Recent CAP and was hospitalized for this in mid March. Needs follow up CXR.  States he feels back to normal. Denies fever, chills, cough, shortness of breath.   He is not fasting today but states he only ate a cup of cereal and milk 4-5 hours ago.     There is no immunization history on file for this patient. he reports Tdap 2 years ago.  Last colonoscopy: 2016- due in 2021 Last PSA: 05/2016 and normal.  Dentist: no teeth but states he manages fine. States he needs implants on lower because his gums cannot hold dentures.  Ophtho:[employer]-  6 months ago  Exercise: 2 days a week at the [facility]  He lives alone. Divorced. 3 kids. His children are in [place], [place], and [place].   Other doctors caring for patient include: Dr. Leonie Man- neurologist. Dr. Havery Moros- GI    Depression screen:  See questionnaire below.     Depression screen [facility] 2/9 04/06/2017 04/10/2015  Decreased Interest 0 0  Down, Depressed, Hopeless 0 0  PHQ - 2 Score 0 0    Fall Screen: See Questionaire below.   Fall Risk  04/06/2017 04/10/2015  Falls in the past year? No Yes  Number falls in past yr: - 1  Injury with Fall? - No  Risk for fall due to : - History of fall(s);Impaired balance/gait;Impaired mobility;Impaired vision  Follow up - Education provided;Falls prevention discussed    ADL screen:  See questionnaire below.  Functional Status Survey: Is the patient deaf or have difficulty hearing?: No Does the patient have difficulty seeing, even when wearing glasses/contacts?: No Does the patient have difficulty concentrating, remembering, or making decisions?: Yes (having trouble focusing on things) Does the patient have difficulty walking or climbing stairs?: Yes Does the patient have difficulty dressing or bathing?: No Does the patient have difficulty doing errands alone  such as visiting a doctor's office or shopping?: No   End of Life Discussion:  Patient does not have a living will and medical power of attorney. Paperwork given and discussed.  His sister and niece are the next of kin.    Review of Systems  Constitutional: -fever, -chills, -sweats, -unexpected weight change, -anorexia, -fatigue Allergy: -sneezing, -itching, -congestion Dermatology: denies changing moles, rash, lumps, new worrisome lesions ENT: -runny nose, -ear pain, -sore throat, -hoarseness, -sinus pain, -teeth pain, -tinnitus, -hearing loss, -epistaxis Cardiology:  -chest pain, -palpitations, -edema, -orthopnea, -paroxysmal nocturnal dyspnea Respiratory: -cough, -shortness of breath, -dyspnea on exertion, -wheezing, -hemoptysis Gastroenterology: -abdominal pain, -nausea, -vomiting, -diarrhea, -constipation, -blood in stool, -changes in bowel movement, -dysphagia Hematology: -bleeding or bruising problems Musculoskeletal: -arthralgias, -myalgias, -joint swelling, -back pain, -neck pain, -cramping, -gait changes Ophthalmology: -vision changes, -eye redness, -itching, -discharge Urology: -dysuria, -difficulty urinating, -hematuria, -urinary frequency, -urgency, incontinence Neurology: -headache, -weakness, -tingling, -numbness, -speech abnormality, -memory loss, -falls, -dizziness Psychology:  -depressed mood, -agitation, -sleep problems   PHYSICAL EXAM:  BP 124/80   Pulse (!) 56   Ht 5' 7.75" (1.721 m)   Wt 223 lb (101.2 kg)   BMI 34.16 kg/m   General Appearance: Alert, cooperative, no distress, appears older than stated age Head: Normocephalic, without obvious abnormality, atraumatic Eyes: PERRL, conjunctiva/corneas clear, EOM's intact, fundi benign Ears: Normal TM's and external ear canals Nose: Nares normal, mucosa normal, no drainage or sinus  tenderness Throat: Lips, mucosa, and tongue  normal; teeth are missing and gums normal Neck: Supple, no lymphadenopathy, thyroid:no  enlargement/tenderness/nodules; no carotid bruit or JVD Back: Spine nontender, no curvature, ROM normal, no CVA tenderness Lungs: Clear to auscultation bilaterally without wheezes, rales or ronchi; respirations unlabored Chest Wall: No tenderness or deformity Heart: Regular rate and rhythm, S1 and S2 normal, no murmur, rub or gallop Breast Exam: No chest wall tenderness, masses or gynecomastia Abdomen: multiple scars,Soft, non-tender, nondistended, normoactive bowel sounds, no masses, no hepatosplenomegaly Genitalia: refuses Rectal: refuses. Done at June 2017 appointment per patient.  Extremities: No clubbing, cyanosis or edema Pulses: 2+ and symmetric all extremities Skin: Skin color, texture, turgor normal, no rashes or lesions Lymph nodes: Cervical, supraclavicular, and axillary nodes normal Neurologic: CNII-XII intact, normal strength, sensation and abnormal gait- amublates with a cane; reflexes 2+ and symmetric throughout   Psych: Normal mood, affect, hygiene and grooming  ASSESSMENT/PLAN: Routine general medical examination at a health care facility - Plan: Urinalysis Dipstick, CBC with Differential/Platelet, Comprehensive metabolic panel, Lipid panel  Medicare annual wellness visit, initial  Community acquired pneumonia, unspecified laterality - Plan: DG Chest 2 View  Seizure disorder ([place]) - Plan: Carbamazepine level, total  Need for hepatitis C screening test - Plan: Hepatitis C antibody  Medication management - Plan: Carbamazepine level, total  Thoracic aortic atherosclerosis ([place]) - Plan: atorvastatin (LIPITOR) 20 MG tablet   Plan to repeat CXR to ensure CAP has resolved. He appears back to baseline and reports feeling "good".   Seizure disorder. Has appt with Dr. Leonie Man in October. Will check Carbamazepine level. Last one was in June 2017 and normal.  One time Hepatitis C screening test ordered.  He will start taking Aspirin 81 mg daily.  He is exercising at the [facility]  twice weekly and encouraged him to continue this and eat a healthy diet.    Discussed PSA screening (risks/benefits), recommended at least 30 minutes of aerobic activity at least 5 days/week; proper sunscreen use reviewed; healthy diet and alcohol recommendations (less than or equal to 2 drinks/day) reviewed; regular seatbelt use; changing batteries in smoke detectors. Immunization recommendations discussed.  Colonoscopy recommendations reviewed.   Medicare Attestation I have personally reviewed: The patient's medical and social history Their use of alcohol, tobacco or illicit drugs Their current medications and supplements The patient's functional ability including ADLs,fall risks, home safety risks, cognitive, and hearing and visual impairment Diet and physical activities Evidence for depression or mood disorders  The patient's weight, height, and BMI have been recorded in the chart.  I have made referrals, counseling, and provided education to the patient based on review of the above and I have provided the patient with a written personalized care plan for preventive services.     Harland Dingwall, NP-C   04/06/2017

## 2017-04-07 LAB — CARBAMAZEPINE LEVEL, TOTAL: Carbamazepine Lvl: 12.2 mg/L — ABNORMAL HIGH (ref 4.0–12.0)

## 2017-04-07 LAB — HEPATITIS C ANTIBODY: HCV Ab: NEGATIVE

## 2017-04-07 NOTE — Addendum Note (Signed)
Addended by: Girtha Rm on: 04/07/2017 08:11 AM   Modules accepted: Orders

## 2017-04-14 ENCOUNTER — Ambulatory Visit
Admission: RE | Admit: 2017-04-14 | Discharge: 2017-04-14 | Disposition: A | Discharge status: Home / Self Care | Payer: PPO | Source: Ambulatory Visit | Attending: Family Medicine | Admitting: Family Medicine

## 2017-04-14 DIAGNOSIS — J189 Pneumonia, unspecified organism: Secondary | ICD-10-CM

## 2017-07-30 ENCOUNTER — Encounter: Payer: Self-pay | Admitting: Family Medicine

## 2017-07-30 ENCOUNTER — Ambulatory Visit (INDEPENDENT_AMBULATORY_CARE_PROVIDER_SITE_OTHER): Payer: PPO | Admitting: Family Medicine

## 2017-07-30 ENCOUNTER — Ambulatory Visit
Admission: RE | Admit: 2017-07-30 | Discharge: 2017-07-30 | Disposition: A | Discharge status: Home / Self Care | Payer: PPO | Source: Ambulatory Visit | Attending: Family Medicine | Admitting: Family Medicine

## 2017-07-30 VITALS — BP 120/80 | HR 62 | Wt 202.6 lb

## 2017-07-30 DIAGNOSIS — S8992XA Unspecified injury of left lower leg, initial encounter: Secondary | ICD-10-CM | POA: Diagnosis not present

## 2017-07-30 DIAGNOSIS — M2342 Loose body in knee, left knee: Secondary | ICD-10-CM

## 2017-07-30 DIAGNOSIS — M25362 Other instability, left knee: Secondary | ICD-10-CM

## 2017-07-30 DIAGNOSIS — M25462 Effusion, left knee: Secondary | ICD-10-CM

## 2017-07-30 DIAGNOSIS — M25562 Pain in left knee: Secondary | ICD-10-CM | POA: Diagnosis not present

## 2017-07-30 DIAGNOSIS — Z9181 History of falling: Secondary | ICD-10-CM | POA: Diagnosis not present

## 2017-07-30 DIAGNOSIS — R531 Weakness: Secondary | ICD-10-CM
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
## 2017-08-04 ENCOUNTER — Other Ambulatory Visit: Payer: Self-pay | Admitting: Nurse Practitioner

## 2017-08-12 ENCOUNTER — Encounter (INDEPENDENT_AMBULATORY_CARE_PROVIDER_SITE_OTHER): Payer: Self-pay | Admitting: Orthopedic Surgery

## 2017-08-12 ENCOUNTER — Ambulatory Visit (INDEPENDENT_AMBULATORY_CARE_PROVIDER_SITE_OTHER): Payer: PPO | Admitting: Orthopedic Surgery

## 2017-08-12 DIAGNOSIS — G8929 Other chronic pain: Secondary | ICD-10-CM | POA: Diagnosis not present

## 2017-08-12 DIAGNOSIS — M25562 Pain in left knee: Secondary | ICD-10-CM | POA: Diagnosis not present
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
## 2017-08-31 ENCOUNTER — Ambulatory Visit (INDEPENDENT_AMBULATORY_CARE_PROVIDER_SITE_OTHER): Payer: Self-pay | Admitting: Orthopedic Surgery

## 2017-08-31 ENCOUNTER — Ambulatory Visit
Admission: RE | Admit: 2017-08-31 | Discharge: 2017-08-31 | Disposition: A | Discharge status: Home / Self Care | Payer: PPO | Source: Ambulatory Visit | Attending: Orthopedic Surgery | Admitting: Orthopedic Surgery

## 2017-08-31 DIAGNOSIS — M25562 Pain in left knee: Secondary | ICD-10-CM | POA: Diagnosis not present

## 2017-08-31 DIAGNOSIS — G8929 Other chronic pain: Secondary | ICD-10-CM

## 2017-08-31 DIAGNOSIS — M25462 Effusion, left knee: Secondary | ICD-10-CM | POA: Diagnosis not present

## 2017-09-03 ENCOUNTER — Ambulatory Visit (INDEPENDENT_AMBULATORY_CARE_PROVIDER_SITE_OTHER): Payer: PPO | Admitting: Orthopedic Surgery

## 2017-09-03 ENCOUNTER — Encounter (INDEPENDENT_AMBULATORY_CARE_PROVIDER_SITE_OTHER): Payer: Self-pay | Admitting: Orthopedic Surgery

## 2017-09-03 DIAGNOSIS — M1712 Unilateral primary osteoarthritis, left knee: Secondary | ICD-10-CM

## 2017-09-05 DIAGNOSIS — M1712 Unilateral primary osteoarthritis, left knee: Secondary | ICD-10-CM | POA: Diagnosis not present

## 2017-09-05 MED ORDER — METHYLPREDNISOLONE ACETATE 40 MG/ML IJ SUSP
40.0000 mg | INTRAMUSCULAR | Status: AC | PRN
  Administered 2017-09-05: 40 mg via INTRA_ARTICULAR

## 2017-09-05 MED ORDER — LIDOCAINE HCL 1 % IJ SOLN
5.0000 mL | INTRAMUSCULAR | Status: AC | PRN
  Administered 2017-09-05: 5 mL

## 2017-09-05 MED ORDER — BUPIVACAINE HCL 0.25 % IJ SOLN
4.0000 mL | INTRAMUSCULAR | Status: AC | PRN
  Administered 2017-09-05: 4 mL via INTRA_ARTICULAR
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
## 2017-09-07 ENCOUNTER — Ambulatory Visit (INDEPENDENT_AMBULATORY_CARE_PROVIDER_SITE_OTHER): Payer: PPO | Admitting: Medical

## 2017-09-07 ENCOUNTER — Telehealth (INDEPENDENT_AMBULATORY_CARE_PROVIDER_SITE_OTHER): Payer: Self-pay | Admitting: Orthopedic Surgery

## 2017-09-07 ENCOUNTER — Encounter: Payer: Self-pay | Admitting: Medical

## 2017-09-07 VITALS — BP 128/82 | HR 60 | Wt 200.4 lb

## 2017-09-07 DIAGNOSIS — L601 Onycholysis: Secondary | ICD-10-CM

## 2017-09-07 DIAGNOSIS — L602 Onychogryphosis: Secondary | ICD-10-CM

## 2017-09-07 DIAGNOSIS — B351 Tinea unguium: Secondary | ICD-10-CM | POA: Insufficient documentation

## 2017-09-07 MED ORDER — TERBINAFINE HCL 250 MG PO TABS: 250.0000 mg | ORAL_TABLET | Freq: Every day | ORAL | 0 refills | Status: DC

## 2017-09-07 MED ORDER — MUPIROCIN 2 % EX OINT: 1.0000 "application " | TOPICAL_OINTMENT | Freq: Three times a day (TID) | CUTANEOUS | 0 refills | Status: DC

## 2017-09-07 NOTE — Telephone Encounter (Signed)
Y pls call thx

## 2017-09-07 NOTE — Telephone Encounter (Signed)
Patient called this morning wanting to know if a knee brace is fine for support with a cane.  CB#[phone].  Thank you

## 2017-09-07 NOTE — Telephone Encounter (Signed)
Please advise 

## 2017-09-07 NOTE — Telephone Encounter (Signed)
Tried calling. No answer. LMVM for patient advising with details per Dr Marlou Sa.

## 2017-09-07 NOTE — Patient Instructions (Signed)
Encounter Diagnoses  Name Primary?  Marland Kitchen Onycholysis of toenail Yes  . Onychomycosis   . Hypertrophic toenail    Recommendations  Later today after your toe numbness wears off, soak the foot in warm soapy water for 20 minutes  Keep the toe clean daily with soap and water  Use the ointment I prescribed, Mupirocin applied to the toe wound 3 times daily for a week  Keep the toe covered with a bandage or bandaid  You can use over the counter Ibuprofen or Aleve for pain  Protect the toe from injury for the next week  Begin the Lamisil tablet once daily for toenail fungus  We are referring you to the foot doctor for nail care in general  If any signs of infection, such as redness, warmth, drainage, pain or swelling, recheck immediately    Fungal Nail Infection Fungal nail infection is a common fungal infection of the toenails or fingernails. This condition affects toenails more often than fingernails. More than one nail may be infected. The condition can be passed from person to person (is contagious). What are the causes? This condition is caused by a fungus. Several types of funguses can cause the infection. These funguses are common in moist and warm areas. If your hands or feet come into contact with the fungus, it may get into a crack in your fingernail or toenail and cause the infection. What increases the risk? The following factors may make you more likely to develop this condition:  Being male.  Having diabetes.  Being of older age.  Living with someone who has the fungus.  Walking barefoot in areas where the fungus thrives, such as showers or locker rooms.  Having poor circulation.  Wearing shoes and socks that cause your feet to sweat.  Having athlete's foot.  Having a nail injury or history of a recent nail surgery.  Having psoriasis.  Having a weak body defense system (immune system).  What are the signs or symptoms? Symptoms of this condition  include:  A pale spot on the nail.  Thickening of the nail.  A nail that becomes yellow or brown.  A brittle or ragged nail edge.  A crumbling nail.  A nail that has lifted away from the nail bed.  How is this diagnosed? This condition is diagnosed with a physical exam. Your health care provider may take a scraping or clipping from your nail to test for the fungus. How is this treated? Mild infections do not need treatment. If you have significant nail changes, treatment may include:  Oral antifungal medicines. You may need to take the medicine for several weeks or several months, and you may not see the results for a long time. These medicines can cause side effects. Ask your health care provider what problems to watch for.  Antifungal nail polish and nail cream. These may be used along with oral antifungal medicines.  Laser treatment of the nail.  Surgery to remove the nail. This may be needed for the most severe infections.  Treatment takes a long time, and the infection may come back. Follow these instructions at home: Medicines  Take or apply over-the-counter and prescription medicines only as told by your health care provider.  Ask your health care provider about using over-the-counter mentholated ointment on your nails. Lifestyle   Do not share personal items, such as towels or nail clippers.  Trim your nails often.  Wash and dry your hands and feet every day.  Wear absorbent  socks, and change your socks frequently.  Wear shoes that allow air to circulate, such as sandals or canvas tennis shoes. Throw out old shoes.  Wear rubber gloves if you are working with your hands in wet areas.  Do not walk barefoot in shower rooms or locker rooms.  Do not use a nail salon that does not use clean instruments.  Do not use artificial nails. General instructions  Keep all follow-up visits as told by your health care provider. This is important.  Use antifungal foot  powder on your feet and in your shoes. Contact a health care provider if: Your infection is not getting better or it is getting worse after several months. This information is not intended to replace advice given to you by your health care provider. Make sure you discuss any questions you have with your health care provider. Document Released: 12/05/2000 Document Revised: 05/15/2016 Document Reviewed: 06/11/2015 Elsevier Interactive Patient Education  2018 [employer].

## 2017-09-07 NOTE — Progress Notes (Addendum)
Subjective Chief Complaint  Patient presents with  . left foot the toenail is coming off    left foot the tonail is coming off x 2 weeks    Here for c/o toenail separation.  First started getting loose from left great toe 2 weeks ago, and now its barely hanging on.  Using bandaid to keep the toenail from moving. No pain, no drainage, no redness, no recent trauma or injury.   Denies wearing tight shoes.  He does note thickened toenails.  No prior podiatry consult.   No fever, not diabetic.   No other aggravating or relieving factors. No other complaint.  Past Medical History:  Diagnosis Date  . Ataxic gait   . Bowel obstruction ([place])    secondary scar tissue  . Dysphagia   . Encephalopathy   . Epilepsy ([place])   . Hypokalemia   . Left-sided weakness    chronic   . Mild cognitive impairment   . Proteinuria   . S/P colostomy ([place])   . TBI (traumatic brain injury) [facility])    Current Outpatient Prescriptions on File Prior to Visit  Medication Sig Dispense Refill  . carbamazepine (TEGRETOL XR) 400 MG 12 hr tablet Take 1 tablet (400 mg total) by mouth 2 (two) times daily. 180 tablet 0  . levETIRAcetam (KEPPRA) 1000 MG tablet Take 1 tablet (1,000 mg total) by mouth 2 (two) times daily. 180 tablet 0  . Multiple Vitamin (MULTIVITAMIN) capsule Take 1 capsule by mouth daily.     No current facility-administered medications on file prior to visit.    ROS as in subjective   Objective BP 128/82   Pulse 60   Wt 200 lb 6.4 oz (90.9 kg)   SpO2 96%   BMI 30.70 kg/m   Gen: wd, wn, nad Seated in chair with cane bilat feet with diffuse toenail thickened and discoloration concerning for fungus Left great toenail is 95% loose, barely attached at base and medial edge of nail bed.  nontender with motion, and I can lift an entire layer of great toenail up off the toe.    Toes and feet with normal cap refill     Assessment: Encounter Diagnoses  Name Primary?  Marland Kitchen Onycholysis of toenail Yes  .  Onychomycosis   . Hypertrophic toenail      Plan: Discussed findings, options for therapy.    Discussed risks/benefits of procedure, and patient gave consent for excision of avulsed nail.  Prepped skin in usual sterile fashion.  Used 2cc of 2% lidocaine with epinephrine for local anesthesia, hemostat and scissors to loose remaining portions of attached avulsed nail.  Irrigated area with high pressure saline.   Covered wound with sterile bandage and dressing.  EBL 2cc.  Patient tolerated procedure well.  Discussed wound care, and advised f/u in 1 wk for wound check.  Recommendations  Later today after your toe numbness wears off, soak the foot in warm soapy water for 20 minutes  Keep the toe clean daily with soap and water  Use the ointment I prescribed, Mupirocin applied to the toe wound 3 times daily for a week  Keep the toe covered with a bandage or bandaid  You can use over the counter Ibuprofen or Aleve for pain  Protect the toe from injury for the next week  Begin the Lamisil tablet once daily for toenail fungus  We are referring you to the foot doctor for nail care in general  If any signs of infection, such as  redness, warmth, drainage, pain or swelling, recheck immediately  Patrick Torres was seen today for left foot the toenail is coming off.  Diagnoses and all orders for this visit:  Onycholysis of toenail -     Ambulatory referral to Podiatry  Onychomycosis -     Ambulatory referral to Podiatry  Hypertrophic toenail -     Ambulatory referral to Podiatry  Other orders -     terbinafine (LAMISIL) 250 MG tablet; Take 1 tablet (250 mg total) by mouth daily. -     mupirocin ointment (BACTROBAN) 2 %; Apply 1 application topically 3 (three) times daily.

## 2017-09-24 DIAGNOSIS — M1712 Unilateral primary osteoarthritis, left knee: Secondary | ICD-10-CM | POA: Diagnosis not present

## 2017-09-25 ENCOUNTER — Ambulatory Visit (INDEPENDENT_AMBULATORY_CARE_PROVIDER_SITE_OTHER): Payer: Medicare HMO | Admitting: Podiatry

## 2017-09-25 ENCOUNTER — Encounter: Payer: Self-pay | Admitting: Podiatry

## 2017-09-25 VITALS — BP 152/80 | HR 51 | Resp 16

## 2017-09-25 DIAGNOSIS — B353 Tinea pedis: Secondary | ICD-10-CM | POA: Diagnosis not present

## 2017-09-25 DIAGNOSIS — S91209S Unspecified open wound of unspecified toe(s) with damage to nail, sequela: Secondary | ICD-10-CM | POA: Diagnosis not present

## 2017-09-25 DIAGNOSIS — B351 Tinea unguium: Secondary | ICD-10-CM | POA: Diagnosis not present

## 2017-09-25 MED ORDER — TERBINAFINE HCL 250 MG PO TABS: 250.0000 mg | ORAL_TABLET | Freq: Every day | ORAL | 0 refills | Status: DC
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
## 2017-10-01 ENCOUNTER — Ambulatory Visit (INDEPENDENT_AMBULATORY_CARE_PROVIDER_SITE_OTHER): Payer: Medicare HMO | Admitting: Neurology

## 2017-10-01 ENCOUNTER — Encounter: Payer: Self-pay | Admitting: Neurology

## 2017-10-01 VITALS — BP 123/82 | HR 49 | Wt 213.8 lb

## 2017-10-01 DIAGNOSIS — G40909 Epilepsy, unspecified, not intractable, without status epilepticus: Secondary | ICD-10-CM

## 2017-10-01 MED ORDER — LEVETIRACETAM 1000 MG PO TABS: 1000.0000 mg | ORAL_TABLET | Freq: Two times a day (BID) | ORAL | 3 refills | Status: DC

## 2017-10-01 MED ORDER — CARBAMAZEPINE ER 400 MG PO TB12: 400.0000 mg | ORAL_TABLET | Freq: Two times a day (BID) | ORAL | 3 refills | Status: DC

## 2017-10-01 NOTE — Patient Instructions (Signed)
I had a long discussion the patient with regards to his remote traumatic brain injury and seizure disorder which appear stable. Continue carbamazepine in the current dose of 400 mg twice daily and Keppra 1 gm  twice daily. Patient was given refills to last him a year. He was advised to use a cane or walker at all times to prevent falls and injuries. Return for follow-up in a year or call earlier if necessary
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
## 2017-10-05 ENCOUNTER — Encounter: Payer: Self-pay | Admitting: Podiatry

## 2017-10-05 ENCOUNTER — Ambulatory Visit (INDEPENDENT_AMBULATORY_CARE_PROVIDER_SITE_OTHER): Payer: Medicare HMO | Admitting: Podiatry

## 2017-10-05 DIAGNOSIS — M79674 Pain in right toe(s): Secondary | ICD-10-CM | POA: Diagnosis not present

## 2017-10-05 DIAGNOSIS — M79675 Pain in left toe(s): Secondary | ICD-10-CM

## 2017-10-05 DIAGNOSIS — B351 Tinea unguium: Secondary | ICD-10-CM

## 2017-10-07 ENCOUNTER — Ambulatory Visit (INDEPENDENT_AMBULATORY_CARE_PROVIDER_SITE_OTHER): Payer: Medicare HMO | Admitting: Orthopedic Surgery

## 2017-10-07 ENCOUNTER — Encounter (INDEPENDENT_AMBULATORY_CARE_PROVIDER_SITE_OTHER): Payer: Self-pay | Admitting: Orthopedic Surgery

## 2017-10-07 DIAGNOSIS — M1712 Unilateral primary osteoarthritis, left knee: Secondary | ICD-10-CM | POA: Diagnosis not present
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
## 2017-10-08 NOTE — Progress Notes (Signed)
Subjective: 56 y.o. returns the office today for painful, elongated, thickened toenails which he cannot trim himself. Denies any redness or drainage around the nails. He has been on Lamisil which has been helping but his nails are still thickened discolored and they're causing pain when wearing socks and shoes. Denies any acute changes since last appointment and no new complaints today. Denies any systemic complaints such as fevers, chills, nausea, vomiting.   PCP: Girtha Rm, NP-C  Objective: AAO 3, NAD DP/PT pulses palpable, CRT less than 3 seconds Nails hypertrophic, dystrophic, elongated, brittle, discolored to the left 3-5 and right 1-5. There is tenderness overlying the nails 1-5 on the right and 3-5 on the left There is no surrounding erythema or drainage along the nail sites. No open lesions or pre-ulcerative lesions are identified. No other areas of tenderness bilateral lower extremities. No overlying edema, erythema, increased warmth. No pain with calf compression, swelling, warmth, erythema.  Assessment: Patient presents with symptomatic onychomycosis  Plan: -Treatment options including alternatives, risks, complications were discussed -Nails sharply debrided 8 without complication/bleeding. As they are thick with fungus and they're painful I believe this is a covered service for him. -Discussed daily foot inspection. If there are any changes, to call the office immediately.  -Follow-up in 3 months or sooner if any problems are to arise. In the meantime, encouraged to call the office with any questions, concerns, changes symptoms.  Celesta Gentile, DPM

## 2017-10-21 ENCOUNTER — Ambulatory Visit (INDEPENDENT_AMBULATORY_CARE_PROVIDER_SITE_OTHER): Payer: Medicare HMO | Admitting: Family Medicine

## 2017-10-21 ENCOUNTER — Encounter: Payer: Self-pay | Admitting: Family Medicine

## 2017-10-21 VITALS — BP 134/84 | HR 76 | Wt 209.2 lb

## 2017-10-21 DIAGNOSIS — Z23 Encounter for immunization: Secondary | ICD-10-CM

## 2017-10-21 DIAGNOSIS — R0781 Pleurodynia: Secondary | ICD-10-CM

## 2017-10-21 NOTE — Addendum Note (Signed)
Addended by: Tyrone Apple on: 10/21/2017 04:31 PM   Modules accepted: Orders

## 2017-10-21 NOTE — Progress Notes (Signed)
   Subjective:    Patient ID: Patrick Torres, male    DOB: 19-Sep-1961, 56 y.o.   MRN: [id number]  HPI He complains of a one-week history of left-sided rib pain but no fever, chills, cough, congestion, shortness of breath, nausea or vomiting. Breathing does tend to make this slightly worse as well as being hugged. He has occasionally taken Naprosyn. It does tend to help.   Review of Systems     Objective:   Physical Exam Alert and in no distress. Questionable tenderness to palpation in the left lateral lower rib area. Abdominal exam shows no masses or tenderness. Lungs are clear to auscultation. Cardiac exam shows regular rhythm without murmurs or gallops.       Assessment & Plan:  Need for influenza vaccination  Rib pain Unclear what exactly causing this but do not think it's of any major concern. He continues have difficulty, he will return here for further evaluation.

## 2017-10-21 NOTE — Patient Instructions (Signed)
Take to naproxen sodium twice per day for the next week

## 2018-01-04 ENCOUNTER — Ambulatory Visit (INDEPENDENT_AMBULATORY_CARE_PROVIDER_SITE_OTHER): Payer: Medicare HMO | Admitting: Podiatry

## 2018-01-04 DIAGNOSIS — B351 Tinea unguium: Secondary | ICD-10-CM

## 2018-01-04 DIAGNOSIS — M79674 Pain in right toe(s): Secondary | ICD-10-CM

## 2018-01-04 DIAGNOSIS — M79675 Pain in left toe(s): Secondary | ICD-10-CM | POA: Diagnosis not present

## 2018-01-04 NOTE — Progress Notes (Signed)
Subjective: 57 y.o. returns the office today for painful, elongated, thickened toenails which he cannot trim himself. He states that the nails are looking better but are still thick and discolored and get painful as they grow out. He is still using topical treatment. No swelling or redness or drainage.   Denies any systemic complaints such as fevers, chills, nausea, vomiting.   PCP: Girtha Rm, NP-C  Objective: AAO 3, NAD DP/PT pulses palpable, CRT less than 3 seconds Nails hypertrophic, dystrophic, elongated, brittle, discolored to the left 3-5 and right 1-5. There is tenderness overlying the nails 1-5 on the right and 3-5 on the left There is no surrounding erythema or drainage along the nail sites. There is some clearing on the proximal nail borders.  No open lesions or pre-ulcerative lesions are identified. No other areas of tenderness bilateral lower extremities. No overlying edema, erythema, increased warmth. No pain with calf compression, swelling, warmth, erythema.  Assessment: Patient presents with symptomatic onychomycosis  Plan: -Treatment options including alternatives, risks, complications were discussed -Nails sharply debrided 8 without complication/bleeding.  -Continue topical anti-fungal  -Discussed daily foot inspection. If there are any changes, to call the office immediately.  -Follow-up in 3 months or sooner if any problems are to arise. In the meantime, encouraged to call the office with any questions, concerns, changes symptoms.  Celesta Gentile, DPM

## 2018-04-05 ENCOUNTER — Ambulatory Visit: Payer: Medicare HMO | Admitting: Podiatry

## 2018-04-05 ENCOUNTER — Encounter: Payer: Self-pay | Admitting: Podiatry

## 2018-04-05 DIAGNOSIS — M79675 Pain in left toe(s): Secondary | ICD-10-CM

## 2018-04-05 DIAGNOSIS — B351 Tinea unguium: Secondary | ICD-10-CM

## 2018-04-05 DIAGNOSIS — M79674 Pain in right toe(s): Secondary | ICD-10-CM | POA: Diagnosis not present

## 2018-04-06 NOTE — Progress Notes (Signed)
Subjective: 56 y.o. returns the office today for painful, elongated, thickened toenails which he cannot trim himself. He states that the nails are looking better but are still thick and discolored and get painful as they grow out however he does that the fungus is getting better he has been continue with the topical treatment.  Denies any redness or drainage or any swelling to the toenail sites he has no other concerns today.   PCP: Henson, Vickie L, NP-C  Objective: AAO 3, NAD DP/PT pulses palpable, CRT less than 3 seconds Nails hypertrophic, dystrophic, elongated, brittle, discolored to the left 3-5 and right 1-5. There is tenderness overlying the nails 1-5 on the right and 3-5 on the left There is no surrounding erythema or drainage along the nail sites. There is some clearing on the proximal nail borders.  No open lesions or pre-ulcerative lesions are identified. No other areas of tenderness bilateral lower extremities. No overlying edema, erythema, increased warmth. No pain with calf compression, swelling, warmth, erythema.  Assessment: 56-year-old male presents with symptomatic onychomycosis  Plan: -Treatment options including alternatives, risks, complications were discussed -Nails sharply debrided 8 without complication/bleeding-continue with topical antifungal. -Discussed daily foot inspection. If there are any changes, to call the office immediately.  -Follow-up in 3 months or sooner if any problems are to arise. In the meantime, encouraged to call the office with any questions, concerns, changes symptoms.  Matthew Wagoner, DPM  

## 2018-04-14 DIAGNOSIS — J302 Other seasonal allergic rhinitis: Secondary | ICD-10-CM | POA: Diagnosis not present

## 2018-04-14 DIAGNOSIS — K08109 Complete loss of teeth, unspecified cause, unspecified class: Secondary | ICD-10-CM | POA: Diagnosis not present

## 2018-04-14 DIAGNOSIS — R69 Illness, unspecified: Secondary | ICD-10-CM | POA: Diagnosis not present

## 2018-04-14 DIAGNOSIS — R269 Unspecified abnormalities of gait and mobility: Secondary | ICD-10-CM | POA: Diagnosis not present

## 2018-04-14 DIAGNOSIS — Z7722 Contact with and (suspected) exposure to environmental tobacco smoke (acute) (chronic): Secondary | ICD-10-CM | POA: Diagnosis not present

## 2018-04-14 DIAGNOSIS — Z823 Family history of stroke: Secondary | ICD-10-CM | POA: Diagnosis not present

## 2018-04-14 DIAGNOSIS — Z803 Family history of malignant neoplasm of breast: Secondary | ICD-10-CM | POA: Diagnosis not present

## 2018-04-14 DIAGNOSIS — R030 Elevated blood-pressure reading, without diagnosis of hypertension: Secondary | ICD-10-CM | POA: Diagnosis not present

## 2018-04-14 DIAGNOSIS — R569 Unspecified convulsions: Secondary | ICD-10-CM | POA: Diagnosis not present

## 2018-07-05 ENCOUNTER — Encounter: Payer: Self-pay | Admitting: Podiatry

## 2018-07-05 ENCOUNTER — Ambulatory Visit (INDEPENDENT_AMBULATORY_CARE_PROVIDER_SITE_OTHER): Payer: Medicare HMO | Admitting: Podiatry

## 2018-07-05 DIAGNOSIS — M79675 Pain in left toe(s): Secondary | ICD-10-CM

## 2018-07-05 DIAGNOSIS — M79674 Pain in right toe(s): Secondary | ICD-10-CM | POA: Diagnosis not present

## 2018-07-05 DIAGNOSIS — B351 Tinea unguium: Secondary | ICD-10-CM | POA: Diagnosis not present

## 2018-07-06 NOTE — Progress Notes (Signed)
Subjective: 57 y.o. returns the office today for painful, elongated, thickened toenails which he cannot trim himself. He states that the nails are looking better but are still thick and discolored and get painful as they grow out however he does that the fungus is getting better he has been continue with the topical treatment.  Denies any redness or drainage or any swelling to the toenail sites he has no other concerns today.   PCP: Girtha Rm, NP-C  Objective: AAO 3, NAD DP/PT pulses palpable, CRT less than 3 seconds Nails hypertrophic, dystrophic, elongated, brittle, discolored to the left 3-5 and right 1-5. There is tenderness overlying the nails 1-5 on the right and 3-5 on the left There is no surrounding erythema or drainage along the nail sites. There is some clearing on the proximal nail borders.  No open lesions or pre-ulcerative lesions are identified. No other areas of tenderness bilateral lower extremities. No overlying edema, erythema, increased warmth. No pain with calf compression, swelling, warmth, erythema.  Assessment: 57 year old male presents with symptomatic onychomycosis  Plan: -Treatment options including alternatives, risks, complications were discussed -Nails sharply debrided 8 without complication/bleeding-continue with topical antifungal. -Discussed daily foot inspection. If there are any changes, to call the office immediately.  -Follow-up in 3 months or sooner if any problems are to arise. In the meantime, encouraged to call the office with any questions, concerns, changes symptoms.  Celesta Gentile, DPM
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
## 2018-07-22 ENCOUNTER — Encounter: Payer: Self-pay | Admitting: Family Medicine

## 2018-07-22 ENCOUNTER — Ambulatory Visit (INDEPENDENT_AMBULATORY_CARE_PROVIDER_SITE_OTHER): Payer: Medicare HMO | Admitting: Family Medicine

## 2018-07-22 VITALS — BP 130/80 | HR 54 | Ht 68.0 in | Wt 217.6 lb

## 2018-07-22 DIAGNOSIS — Z125 Encounter for screening for malignant neoplasm of prostate: Secondary | ICD-10-CM

## 2018-07-22 DIAGNOSIS — Z13220 Encounter for screening for lipoid disorders: Secondary | ICD-10-CM | POA: Diagnosis not present

## 2018-07-22 DIAGNOSIS — G40909 Epilepsy, unspecified, not intractable, without status epilepticus: Secondary | ICD-10-CM

## 2018-07-22 DIAGNOSIS — Z0000 Encounter for general adult medical examination without abnormal findings: Secondary | ICD-10-CM

## 2018-07-22 DIAGNOSIS — R531 Weakness: Secondary | ICD-10-CM

## 2018-07-22 DIAGNOSIS — Z7189 Other specified counseling: Secondary | ICD-10-CM

## 2018-07-22 DIAGNOSIS — R972 Elevated prostate specific antigen [PSA]: Secondary | ICD-10-CM | POA: Diagnosis not present

## 2018-07-22 LAB — POCT URINALYSIS DIP (PROADVANTAGE DEVICE)
Bilirubin, UA: NEGATIVE
Blood, UA: NEGATIVE
Glucose, UA: NEGATIVE mg/dL
Ketones, POC UA: NEGATIVE mg/dL
LEUKOCYTES UA: NEGATIVE
Nitrite, UA: NEGATIVE
Specific Gravity, Urine: 1.025
UUROB: 3.5
pH, UA: 6 (ref 5.0–8.0)

## 2018-07-22 NOTE — Patient Instructions (Addendum)
Call and check with [facility].   Continue on your current medications.   We will call you with your lab results.

## 2018-07-23 LAB — COMPREHENSIVE METABOLIC PANEL
ALK PHOS: 56 IU/L (ref 39–117)
ALT: 12 IU/L (ref 0–44)
AST: 13 IU/L (ref 0–40)
Albumin/Globulin Ratio: 1.5 (ref 1.2–2.2)
Albumin: 4.2 g/dL (ref 3.5–5.5)
BUN/Creatinine Ratio: 7 — ABNORMAL LOW (ref 9–20)
BUN: 7 mg/dL (ref 6–24)
Bilirubin Total: 0.4 mg/dL (ref 0.0–1.2)
CO2: 20 mmol/L (ref 20–29)
CREATININE: 1.02 mg/dL (ref 0.76–1.27)
Calcium: 9.3 mg/dL (ref 8.7–10.2)
Chloride: 104 mmol/L (ref 96–106)
GFR calc Af Amer: 93 mL/min/{1.73_m2} (ref >59)
GFR calc non Af Amer: 81 mL/min/{1.73_m2} (ref >59)
GLUCOSE: 94 mg/dL (ref 65–99)
Globulin, Total: 2.8 g/dL (ref 1.5–4.5)
Potassium: 4 mmol/L (ref 3.5–5.2)
Sodium: 139 mmol/L (ref 134–144)
Total Protein: 7 g/dL (ref 6.0–8.5)

## 2018-07-23 LAB — CBC WITH DIFFERENTIAL/PLATELET
BASOS ABS: 0.1 10*3/uL (ref 0.0–0.2)
Basos: 1 %
EOS (ABSOLUTE): 0.4 10*3/uL (ref 0.0–0.4)
Eos: 9 %
HEMOGLOBIN: 14.8 g/dL (ref 13.0–17.7)
Hematocrit: 44.9 % (ref 37.5–51.0)
Immature Grans (Abs): 0 10*3/uL (ref 0.0–0.1)
Immature Granulocytes: 0 %
LYMPHS ABS: 1.4 10*3/uL (ref 0.7–3.1)
Lymphs: 30 %
MCH: 30.5 pg (ref 26.6–33.0)
MCHC: 33 g/dL (ref 31.5–35.7)
MCV: 92 fL (ref 79–97)
MONOS ABS: 0.4 10*3/uL (ref 0.1–0.9)
Monocytes: 9 %
NEUTROS PCT: 51 %
Neutrophils Absolute: 2.2 10*3/uL (ref 1.4–7.0)
Platelets: 185 10*3/uL (ref 150–450)
RBC: 4.86 x10E6/uL (ref 4.14–5.80)
RDW: 14.6 % (ref 12.3–15.4)
WBC: 4.5 10*3/uL (ref 3.4–10.8)

## 2018-07-23 LAB — TSH: TSH: 3.07 u[IU]/mL (ref 0.450–4.500)

## 2018-07-23 LAB — LIPID PANEL
CHOL/HDL RATIO: 3.1 ratio (ref 0.0–5.0)
Cholesterol, Total: 190 mg/dL (ref 100–199)
HDL: 61 mg/dL (ref >39)
LDL Calculated: 117 mg/dL — ABNORMAL HIGH (ref 0–99)
Triglycerides: 61 mg/dL (ref 0–149)
VLDL Cholesterol Cal: 12 mg/dL (ref 5–40)

## 2018-07-23 LAB — PSA: PROSTATE SPECIFIC AG, SERUM: 4.9 ng/mL — AB (ref 0.0–4.0)

## 2018-07-24 LAB — PSA, TOTAL AND FREE
PSA, Free Pct: 11.2 %
PSA, Free: 0.55 ng/mL
Prostate Specific Ag, Serum: 4.9 ng/mL — ABNORMAL HIGH (ref 0.0–4.0)

## 2018-07-24 LAB — SPECIMEN STATUS REPORT

## 2018-07-25 ENCOUNTER — Encounter: Payer: Self-pay | Admitting: Family Medicine

## 2018-07-25 DIAGNOSIS — R972 Elevated prostate specific antigen [PSA]: Secondary | ICD-10-CM | POA: Insufficient documentation

## 2018-08-10 ENCOUNTER — Other Ambulatory Visit: Payer: Self-pay | Admitting: Neurology

## 2018-08-30 DIAGNOSIS — R972 Elevated prostate specific antigen [PSA]: Secondary | ICD-10-CM | POA: Diagnosis not present

## 2018-09-20 ENCOUNTER — Emergency Department (HOSPITAL_COMMUNITY): Payer: Medicare HMO

## 2018-09-20 ENCOUNTER — Encounter (HOSPITAL_COMMUNITY): Payer: Self-pay | Admitting: Emergency Medicine

## 2018-09-20 ENCOUNTER — Emergency Department (HOSPITAL_COMMUNITY)
Admission: EM | Admit: 2018-09-20 | Discharge: 2018-09-20 | Disposition: A | Discharge status: Home / Self Care | Payer: Medicare HMO | Attending: Emergency Medicine | Admitting: Emergency Medicine

## 2018-09-20 DIAGNOSIS — R519 Headache, unspecified: Secondary | ICD-10-CM

## 2018-09-20 DIAGNOSIS — R51 Headache: Secondary | ICD-10-CM | POA: Diagnosis not present

## 2018-09-20 DIAGNOSIS — G4489 Other headache syndrome: Secondary | ICD-10-CM | POA: Diagnosis not present

## 2018-09-20 DIAGNOSIS — Z79899 Other long term (current) drug therapy: Secondary | ICD-10-CM | POA: Insufficient documentation

## 2018-09-20 DIAGNOSIS — I6523 Occlusion and stenosis of bilateral carotid arteries: Secondary | ICD-10-CM

## 2018-09-20 LAB — SEDIMENTATION RATE: Sed Rate: 2 mm/hr (ref 0–16)

## 2018-09-20 LAB — C-REACTIVE PROTEIN

## 2018-09-20 MED ORDER — LEVETIRACETAM 500 MG PO TABS
1000.0000 mg | ORAL_TABLET | Freq: Once | ORAL | Status: AC
  Administered 2018-09-20: 1000 mg via ORAL
  Filled 2018-09-20: qty 2

## 2018-09-20 MED ORDER — DIPHENHYDRAMINE HCL 50 MG/ML IJ SOLN
25.0000 mg | Freq: Once | INTRAMUSCULAR | Status: AC
  Administered 2018-09-20: 25 mg via INTRAVENOUS
  Filled 2018-09-20: qty 1

## 2018-09-20 MED ORDER — PROCHLORPERAZINE EDISYLATE 10 MG/2ML IJ SOLN
10.0000 mg | Freq: Once | INTRAMUSCULAR | Status: AC
  Administered 2018-09-20: 10 mg via INTRAVENOUS
  Filled 2018-09-20: qty 2

## 2018-09-20 MED ORDER — KETOROLAC TROMETHAMINE 15 MG/ML IJ SOLN
15.0000 mg | Freq: Once | INTRAMUSCULAR | Status: AC
  Administered 2018-09-20: 15 mg via INTRAVENOUS
  Filled 2018-09-20: qty 1

## 2018-09-20 MED ORDER — CARBAMAZEPINE ER 200 MG PO TB12
400.0000 mg | ORAL_TABLET | Freq: Once | ORAL | Status: AC
  Administered 2018-09-20: 400 mg via ORAL
  Filled 2018-09-20: qty 2

## 2018-09-20 MED ORDER — SODIUM CHLORIDE 0.9 % IV BOLUS
1000.0000 mL | Freq: Once | INTRAVENOUS | Status: AC
  Administered 2018-09-20: 1000 mL via INTRAVENOUS
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
## 2018-09-20 NOTE — ED Triage Notes (Signed)
Brought by ems from home for c/o left sided headache since Saturday.  Describes as an ache that comes and goes.  Hx of left sided weakness from TBI.

## 2018-09-20 NOTE — Discharge Instructions (Signed)
You should follow-up with your regular doctor in 1 week for reevaluation and to follow-up in regards to the findings on your head CT.  If you have any new or worsening symptoms in the meantime you should return to the emergency department.

## 2018-09-22 ENCOUNTER — Ambulatory Visit (INDEPENDENT_AMBULATORY_CARE_PROVIDER_SITE_OTHER): Payer: Medicare HMO | Admitting: Family Medicine

## 2018-09-22 ENCOUNTER — Encounter: Payer: Self-pay | Admitting: Family Medicine

## 2018-09-22 VITALS — BP 120/70 | HR 78 | Temp 98.5°F | Resp 16 | Wt 217.4 lb

## 2018-09-22 DIAGNOSIS — R51 Headache: Secondary | ICD-10-CM | POA: Diagnosis not present

## 2018-09-22 DIAGNOSIS — R519 Headache, unspecified: Secondary | ICD-10-CM

## 2018-09-22 DIAGNOSIS — H6122 Impacted cerumen, left ear: Secondary | ICD-10-CM | POA: Diagnosis not present
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
## 2018-09-23 ENCOUNTER — Encounter: Payer: Self-pay | Admitting: Nurse Practitioner

## 2018-09-23 ENCOUNTER — Ambulatory Visit: Payer: Medicare HMO | Admitting: Nurse Practitioner

## 2018-09-23 VITALS — BP 124/80 | HR 57 | Ht 68.0 in | Wt 217.0 lb

## 2018-09-23 DIAGNOSIS — R519 Headache, unspecified: Secondary | ICD-10-CM

## 2018-09-23 DIAGNOSIS — Z87820 Personal history of traumatic brain injury: Secondary | ICD-10-CM | POA: Insufficient documentation

## 2018-09-23 DIAGNOSIS — R531 Weakness: Secondary | ICD-10-CM | POA: Diagnosis not present

## 2018-09-23 DIAGNOSIS — R51 Headache: Secondary | ICD-10-CM | POA: Diagnosis not present

## 2018-09-23 DIAGNOSIS — G40309 Generalized idiopathic epilepsy and epileptic syndromes, not intractable, without status epilepticus: Secondary | ICD-10-CM

## 2018-09-23 NOTE — Patient Instructions (Signed)
Continue Tegretol at current dose will refill Continue Keppra at current dose will refill Will check labs today, CBZ level for toxcitiy Call for  seizure activity Try Tylenol for headache instead of Naprosyn CT of the head in the emergency room was normal without acute changes on 09/20/18

## 2018-09-24 ENCOUNTER — Telehealth: Payer: Self-pay | Admitting: *Deleted

## 2018-09-24 ENCOUNTER — Telehealth: Payer: Self-pay | Admitting: Family Medicine

## 2018-09-24 LAB — CARBAMAZEPINE LEVEL, TOTAL: CARBAMAZEPINE LVL: 9.1 ug/mL (ref 4.0–12.0)

## 2018-09-24 NOTE — Telephone Encounter (Signed)
LMVM for pt that his lab results were stable per CM/NP.  If he has questions he could call me back at [phone]. We close today at 1200 just FYI.

## 2018-09-24 NOTE — Telephone Encounter (Signed)
-----   Message from Dennie Bible, NP sent at 09/24/2018  8:02 AM EDT ----- Labs stable please call the patient

## 2018-09-24 NOTE — Telephone Encounter (Signed)
Pt called. He wanted to advise you that the pain he has been experiencing only happens at night. He states he is pain free during the day. Pt can be reached at [phone].

## 2018-09-30 ENCOUNTER — Encounter: Payer: Self-pay | Admitting: Family Medicine

## 2018-09-30 DIAGNOSIS — R972 Elevated prostate specific antigen [PSA]: Secondary | ICD-10-CM | POA: Diagnosis not present

## 2018-10-05 ENCOUNTER — Ambulatory Visit: Payer: Medicare HMO | Admitting: Nurse Practitioner

## 2018-10-08 ENCOUNTER — Ambulatory Visit: Payer: Medicare HMO | Admitting: Podiatry

## 2018-10-12 DIAGNOSIS — C61 Malignant neoplasm of prostate: Secondary | ICD-10-CM | POA: Diagnosis not present

## 2018-10-13 ENCOUNTER — Other Ambulatory Visit (HOSPITAL_COMMUNITY): Payer: Self-pay | Admitting: Urology

## 2018-10-13 DIAGNOSIS — C61 Malignant neoplasm of prostate: Secondary | ICD-10-CM

## 2018-10-26 ENCOUNTER — Encounter (HOSPITAL_COMMUNITY)
Admission: RE | Admit: 2018-10-26 | Discharge: 2018-10-26 | Disposition: A | Discharge status: Home / Self Care | Payer: Medicare HMO | Source: Ambulatory Visit | Attending: Urology | Admitting: Urology

## 2018-10-26 DIAGNOSIS — C61 Malignant neoplasm of prostate: Secondary | ICD-10-CM | POA: Insufficient documentation

## 2018-10-26 MED ORDER — TECHNETIUM TC 99M MEDRONATE IV KIT
21.6000 | PACK | Freq: Once | INTRAVENOUS | Status: AC | PRN
  Administered 2018-10-26: 21.6 via INTRAVENOUS

## 2018-10-29 ENCOUNTER — Encounter: Payer: Self-pay | Admitting: Family Medicine

## 2018-11-01 ENCOUNTER — Encounter: Payer: Self-pay | Admitting: Radiation Oncology
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
## 2018-11-02 ENCOUNTER — Other Ambulatory Visit: Payer: Self-pay

## 2018-11-02 ENCOUNTER — Encounter: Payer: Self-pay | Admitting: Radiation Oncology

## 2018-11-02 ENCOUNTER — Ambulatory Visit
Admission: RE | Admit: 2018-11-02 | Discharge: 2018-11-02 | Disposition: A | Discharge status: Home / Self Care | Payer: Medicare HMO | Source: Ambulatory Visit | Attending: Radiation Oncology | Admitting: Radiation Oncology

## 2018-11-02 VITALS — BP 138/86 | HR 48 | Temp 97.9°F | Resp 18 | Ht 68.0 in | Wt 217.1 lb

## 2018-11-02 DIAGNOSIS — R972 Elevated prostate specific antigen [PSA]: Secondary | ICD-10-CM | POA: Diagnosis not present

## 2018-11-02 DIAGNOSIS — C61 Malignant neoplasm of prostate: Secondary | ICD-10-CM | POA: Insufficient documentation

## 2018-11-02 DIAGNOSIS — Z87820 Personal history of traumatic brain injury: Secondary | ICD-10-CM | POA: Insufficient documentation

## 2018-11-02 DIAGNOSIS — Z79899 Other long term (current) drug therapy: Secondary | ICD-10-CM | POA: Insufficient documentation

## 2018-11-02 HISTORY — DX: Malignant neoplasm of prostate: C61

## 2018-11-02 NOTE — Progress Notes (Signed)
See progress note under physician encounter. 
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
## 2018-11-05 ENCOUNTER — Ambulatory Visit: Payer: Medicare HMO | Admitting: Podiatry

## 2018-11-08 ENCOUNTER — Ambulatory Visit: Payer: Medicare HMO | Admitting: Radiation Oncology

## 2018-11-09 ENCOUNTER — Encounter: Payer: Self-pay | Admitting: Medical Oncology

## 2018-11-25 DIAGNOSIS — C61 Malignant neoplasm of prostate: Secondary | ICD-10-CM | POA: Diagnosis not present

## 2018-12-28 ENCOUNTER — Other Ambulatory Visit: Payer: Self-pay | Admitting: Urology

## 2018-12-30 ENCOUNTER — Telehealth: Payer: Self-pay | Admitting: *Deleted

## 2018-12-30 NOTE — Telephone Encounter (Signed)
Called patient to remind of pre-seed appts. and chest x-ray and EKG for 12-31-18, lvm for a return call

## 2018-12-31 ENCOUNTER — Other Ambulatory Visit (HOSPITAL_COMMUNITY): Payer: Self-pay

## 2018-12-31 ENCOUNTER — Ambulatory Visit: Payer: Medicare HMO | Admitting: Radiation Oncology

## 2018-12-31 ENCOUNTER — Ambulatory Visit: Payer: Self-pay | Admitting: Urology

## 2019-01-06 ENCOUNTER — Telehealth: Payer: Self-pay | Admitting: *Deleted

## 2019-01-06 NOTE — Telephone Encounter (Signed)
CALLED PATIENT TO REMIND OF PRE-SEED APPTS. FOR 01-07-19 AND HIS CHEST X-RAY AND EKG FOR 01-07-19, SPOKE WITH PATIENT AND HE IS AWARE OF THESE APPTS.

## 2019-01-06 NOTE — Telephone Encounter (Signed)
CALLED PATIENT TO REMIND OF PRE-SEED APPTS. AND CHEST X-RAY AND EKG FOR 01-07-19, LINE BUSY WILL CALL LATER

## 2019-01-07 ENCOUNTER — Encounter (HOSPITAL_COMMUNITY)
Admission: RE | Admit: 2019-01-07 | Discharge: 2019-01-07 | Disposition: A | Discharge status: Home / Self Care | Payer: Medicare Other | Source: Ambulatory Visit | Attending: Urology | Admitting: Urology

## 2019-01-07 ENCOUNTER — Other Ambulatory Visit: Payer: Self-pay

## 2019-01-07 ENCOUNTER — Encounter: Payer: Self-pay | Admitting: Medical Oncology

## 2019-01-07 ENCOUNTER — Ambulatory Visit
Admission: RE | Admit: 2019-01-07 | Discharge: 2019-01-07 | Disposition: A | Discharge status: Home / Self Care | Payer: Medicare Other | Source: Ambulatory Visit | Attending: Radiation Oncology | Admitting: Radiation Oncology

## 2019-01-07 ENCOUNTER — Ambulatory Visit (HOSPITAL_COMMUNITY)
Admission: RE | Admit: 2019-01-07 | Discharge: 2019-01-07 | Disposition: A | Discharge status: Home / Self Care | Payer: Medicare Other | Source: Ambulatory Visit | Attending: Urology | Admitting: Urology

## 2019-01-07 ENCOUNTER — Ambulatory Visit
Admission: RE | Admit: 2019-01-07 | Discharge: 2019-01-07 | Disposition: A | Discharge status: Home / Self Care | Payer: Medicare Other | Source: Ambulatory Visit | Attending: Urology | Admitting: Urology

## 2019-01-07 DIAGNOSIS — C61 Malignant neoplasm of prostate: Secondary | ICD-10-CM | POA: Diagnosis present

## 2019-01-07 DIAGNOSIS — Z510 Encounter for antineoplastic radiation therapy: Secondary | ICD-10-CM | POA: Diagnosis present

## 2019-01-07 NOTE — Progress Notes (Signed)
Pt came today and had cxr and ekg done for prostate seed implant surgery.  Abnormal ekg with possible ischemia.  Konrad Felix PA for anesthesia, assessed pt and reviewed previous ekg's done in epic, stated ok for pt to proceed.  Confirmed ekg by dr berry viewed by Janett Billow.

## 2019-01-07 NOTE — Progress Notes (Signed)
  Radiation Oncology         [phone] ________________________________  Name: Patrick Torres MRN: [id number]  Date: 01/07/2019  DOB: 1961/07/21  SIMULATION AND TREATMENT PLANNING NOTE PUBIC ARCH STUDY  CN:[email or web address], [name], MD  [facility]ourd*  DIAGNOSIS: 58 y.o. gentleman with Stage T1c adenocarcinoma of the prostate with Gleason score of 4+3, and PSA of 4.26.     ICD-10-CM   1. Malignant neoplasm of prostate ([place]) C61     COMPLEX SIMULATION:  The patient presented today for evaluation for possible prostate seed implant. He was brought to the radiation planning suite and placed supine on the CT couch. A 3-dimensional image study set was obtained in upload to the planning computer. There, on each axial slice, I contoured the prostate gland. Then, using three-dimensional radiation planning tools I reconstructed the prostate in view of the structures from the transperineal needle pathway to assess for possible pubic arch interference. In doing so, I did not appreciate any pubic arch interference. Also, the patient's prostate volume was estimated based on the drawn structure. The volume was 35 cc.  Given the pubic arch appearance and prostate volume, patient remains a good candidate to proceed with prostate seed implant. Today, he freely provided informed written consent to proceed.    PLAN: The patient will undergo prostate seed implant.   ________________________________  Sheral Apley. Tammi Klippel, M.D.   This document serves as a record of services personally performed by Tyler Pita, MD. It was created on his behalf by Wilburn Mylar, a trained medical scribe. The creation of this record is based on the scribe's personal observations and the provider's statements to them. This document has been checked and approved by the attending provider.

## 2019-01-14 ENCOUNTER — Other Ambulatory Visit: Payer: Self-pay | Admitting: Urology

## 2019-01-14 DIAGNOSIS — C61 Malignant neoplasm of prostate: Secondary | ICD-10-CM

## 2019-02-08 ENCOUNTER — Telehealth: Payer: Self-pay | Admitting: *Deleted

## 2019-02-08 NOTE — Telephone Encounter (Signed)
Called patient to inform of lab appt. On 02-09-19 - arrival time- 1:30 pm @ [facility] Admitting, lvm for a return call

## 2019-02-09 ENCOUNTER — Other Ambulatory Visit (HOSPITAL_COMMUNITY): Payer: Medicare Other

## 2019-02-09 ENCOUNTER — Inpatient Hospital Stay (HOSPITAL_COMMUNITY): Admission: RE | Admit: 2019-02-09 | Payer: Medicare Other | Source: Ambulatory Visit

## 2019-02-10 ENCOUNTER — Encounter (HOSPITAL_COMMUNITY)
Admission: RE | Admit: 2019-02-10 | Discharge: 2019-02-10 | Disposition: A | Discharge status: Home / Self Care | Payer: Medicare Other | Source: Ambulatory Visit | Attending: Urology | Admitting: Urology

## 2019-02-10 DIAGNOSIS — Z01812 Encounter for preprocedural laboratory examination: Secondary | ICD-10-CM | POA: Diagnosis present

## 2019-02-10 LAB — COMPREHENSIVE METABOLIC PANEL
ALK PHOS: 56 U/L (ref 38–126)
ALT: 16 U/L (ref 0–44)
AST: 19 U/L (ref 15–41)
Albumin: 4 g/dL (ref 3.5–5.0)
Anion gap: 7 (ref 5–15)
BILIRUBIN TOTAL: 0.9 mg/dL (ref 0.3–1.2)
BUN: 8 mg/dL (ref 6–20)
CALCIUM: 8.5 mg/dL — AB (ref 8.9–10.3)
CHLORIDE: 108 mmol/L (ref 98–111)
CO2: 25 mmol/L (ref 22–32)
CREATININE: 0.96 mg/dL (ref 0.61–1.24)
GFR calc Af Amer: >60 mL/min (ref >60)
Glucose, Bld: 91 mg/dL (ref 70–99)
Potassium: 3.8 mmol/L (ref 3.5–5.1)
Sodium: 140 mmol/L (ref 135–145)
TOTAL PROTEIN: 7.3 g/dL (ref 6.5–8.1)

## 2019-02-10 LAB — CBC
HEMATOCRIT: 44.1 % (ref 39.0–52.0)
HEMOGLOBIN: 14.2 g/dL (ref 13.0–17.0)
MCH: 30.5 pg (ref 26.0–34.0)
MCHC: 32.2 g/dL (ref 30.0–36.0)
MCV: 94.8 fL (ref 80.0–100.0)
NRBC: 0 % (ref 0.0–0.2)
PLATELETS: 181 10*3/uL (ref 150–400)
RBC: 4.65 MIL/uL (ref 4.22–5.81)
RDW: 12.8 % (ref 11.5–15.5)
WBC: 5 10*3/uL (ref 4.0–10.5)

## 2019-02-10 LAB — PROTIME-INR
INR: 1.21
PROTHROMBIN TIME: 15.1 s (ref 11.4–15.2)

## 2019-02-10 LAB — APTT: aPTT: 25 seconds (ref 24–36)

## 2019-02-14 ENCOUNTER — Encounter (HOSPITAL_BASED_OUTPATIENT_CLINIC_OR_DEPARTMENT_OTHER): Payer: Self-pay | Admitting: *Deleted

## 2019-02-14 ENCOUNTER — Other Ambulatory Visit: Payer: Self-pay

## 2019-02-14 NOTE — Progress Notes (Signed)
Spoke w/ pt via phone for pre-op interveiw.  Npo after mn.  Arrive at 0530.  Current ekg, cxr, and lab results in chart and epic.  Arrive at 0530.  Pt states unable to do fleet enema himself , so he is bringing the  fleet enema morning of surgery and his sister will help him do this on arrival in pre-op.  Also, pt did not want to take his am meds much earlier than his usual time at 0800, so pt is bringing his medication bottles with him to take prior to surgery with sips of water (tegretol, keppra).  Pt is understanding but slow.
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
## 2019-02-15 ENCOUNTER — Telehealth: Payer: Self-pay | Admitting: *Deleted

## 2019-02-15 NOTE — Telephone Encounter (Signed)
CALLED PATIENT TO REMIND OF PROCEDURE FOR 02-16-19, LVM FOR A RETURN CALL

## 2019-02-16 ENCOUNTER — Encounter (HOSPITAL_BASED_OUTPATIENT_CLINIC_OR_DEPARTMENT_OTHER): Payer: Self-pay | Admitting: Emergency Medicine

## 2019-02-16 ENCOUNTER — Ambulatory Visit (HOSPITAL_BASED_OUTPATIENT_CLINIC_OR_DEPARTMENT_OTHER): Payer: Medicare Other | Admitting: Anesthesiology

## 2019-02-16 ENCOUNTER — Ambulatory Visit (HOSPITAL_BASED_OUTPATIENT_CLINIC_OR_DEPARTMENT_OTHER)
Admission: RE | Admit: 2019-02-16 | Discharge: 2019-02-16 | Disposition: A | Discharge status: Home / Self Care | Payer: Medicare Other | Attending: Urology | Admitting: Urology

## 2019-02-16 ENCOUNTER — Other Ambulatory Visit: Payer: Self-pay

## 2019-02-16 ENCOUNTER — Encounter (HOSPITAL_BASED_OUTPATIENT_CLINIC_OR_DEPARTMENT_OTHER)
Admission: RE | Disposition: A | Discharge status: Home / Self Care | Payer: Self-pay | Source: Home / Self Care | Attending: Urology

## 2019-02-16 ENCOUNTER — Ambulatory Visit (HOSPITAL_COMMUNITY): Payer: Medicare Other

## 2019-02-16 DIAGNOSIS — C61 Malignant neoplasm of prostate: Secondary | ICD-10-CM | POA: Insufficient documentation

## 2019-02-16 DIAGNOSIS — G40909 Epilepsy, unspecified, not intractable, without status epilepticus: Secondary | ICD-10-CM | POA: Insufficient documentation

## 2019-02-16 DIAGNOSIS — M1990 Unspecified osteoarthritis, unspecified site: Secondary | ICD-10-CM | POA: Diagnosis not present

## 2019-02-16 DIAGNOSIS — Z79899 Other long term (current) drug therapy: Secondary | ICD-10-CM | POA: Diagnosis not present

## 2019-02-16 HISTORY — DX: Unspecified osteoarthritis, unspecified site: M19.90

## 2019-02-16 HISTORY — PX: RADIOACTIVE SEED IMPLANT: SHX5150

## 2019-02-16 HISTORY — DX: Mild cognitive impairment of uncertain or unknown etiology: G31.84

## 2019-02-16 HISTORY — DX: Personal history of other (healed) physical injury and trauma: Z87.828

## 2019-02-16 HISTORY — DX: Personal history of urinary calculi: Z87.442

## 2019-02-16 HISTORY — DX: Presence of spectacles and contact lenses: Z97.3

## 2019-02-16 HISTORY — DX: Spastic hemiplegia affecting left nondominant side: G81.14

## 2019-02-16 HISTORY — PX: SPACE OAR INSTILLATION: SHX6769

## 2019-02-16 HISTORY — PX: CYSTOSCOPY: SHX5120

## 2019-02-16 HISTORY — DX: Personal history of traumatic brain injury: Z87.820

## 2019-02-16 SURGERY — INSERTION, RADIATION SOURCE, PROSTATE
Anesthesia: General | Site: Prostate

## 2019-02-16 MED ORDER — CIPROFLOXACIN IN D5W 400 MG/200ML IV SOLN
400.0000 mg | INTRAVENOUS | Status: AC
  Administered 2019-02-16: 400 mg via INTRAVENOUS
  Filled 2019-02-16: qty 200

## 2019-02-16 MED ORDER — KETOROLAC TROMETHAMINE 30 MG/ML IJ SOLN
INTRAMUSCULAR | Status: AC
  Filled 2019-02-16: qty 1

## 2019-02-16 MED ORDER — SODIUM CHLORIDE FLUSH 0.9 % IV SOLN
INTRAVENOUS | Status: DC | PRN
  Administered 2019-02-16: 10 mL

## 2019-02-16 MED ORDER — OXYCODONE HCL 5 MG PO TABS
ORAL_TABLET | ORAL | Status: AC
  Filled 2019-02-16: qty 1

## 2019-02-16 MED ORDER — SODIUM CHLORIDE 0.9 % IR SOLN
Status: DC | PRN
  Administered 2019-02-16: 1 via INTRAVESICAL

## 2019-02-16 MED ORDER — CIPROFLOXACIN IN D5W 400 MG/200ML IV SOLN
INTRAVENOUS | Status: AC
  Filled 2019-02-16: qty 200

## 2019-02-16 MED ORDER — DEXAMETHASONE SODIUM PHOSPHATE 10 MG/ML IJ SOLN
INTRAMUSCULAR | Status: AC
  Filled 2019-02-16: qty 1

## 2019-02-16 MED ORDER — KETOROLAC TROMETHAMINE 30 MG/ML IJ SOLN
INTRAMUSCULAR | Status: DC | PRN
  Administered 2019-02-16: 30 mg via INTRAVENOUS

## 2019-02-16 MED ORDER — FLEET ENEMA 7-19 GM/118ML RE ENEM
1.0000 | ENEMA | Freq: Once | RECTAL | Status: DC
  Filled 2019-02-16: qty 1

## 2019-02-16 MED ORDER — FENTANYL CITRATE (PF) 100 MCG/2ML IJ SOLN
INTRAMUSCULAR | Status: AC
  Filled 2019-02-16: qty 2

## 2019-02-16 MED ORDER — ARTIFICIAL TEARS OPHTHALMIC OINT
TOPICAL_OINTMENT | OPHTHALMIC | Status: AC
  Filled 2019-02-16: qty 3.5

## 2019-02-16 MED ORDER — OXYBUTYNIN CHLORIDE 5 MG PO TABS: 5.0000 mg | ORAL_TABLET | Freq: Three times a day (TID) | ORAL | 1 refills | Status: DC | PRN

## 2019-02-16 MED ORDER — LIDOCAINE 2% (20 MG/ML) 5 ML SYRINGE
INTRAMUSCULAR | Status: AC
  Filled 2019-02-16: qty 5

## 2019-02-16 MED ORDER — CIPROFLOXACIN HCL 500 MG PO TABS: 500.0000 mg | ORAL_TABLET | Freq: Two times a day (BID) | ORAL | 0 refills | Status: AC

## 2019-02-16 MED ORDER — OXYCODONE HCL 5 MG PO TABS
5.0000 mg | ORAL_TABLET | Freq: Once | ORAL | Status: AC | PRN
  Administered 2019-02-16: 5 mg via ORAL
  Filled 2019-02-16: qty 1

## 2019-02-16 MED ORDER — DEXAMETHASONE SODIUM PHOSPHATE 10 MG/ML IJ SOLN
INTRAMUSCULAR | Status: DC | PRN
  Administered 2019-02-16: 5 mg via INTRAVENOUS

## 2019-02-16 MED ORDER — MIDAZOLAM HCL 2 MG/2ML IJ SOLN
INTRAMUSCULAR | Status: AC
  Filled 2019-02-16: qty 2

## 2019-02-16 MED ORDER — PROPOFOL 10 MG/ML IV BOLUS
INTRAVENOUS | Status: DC | PRN
  Administered 2019-02-16: 120 mg via INTRAVENOUS

## 2019-02-16 MED ORDER — LACTATED RINGERS IV SOLN
INTRAVENOUS | Status: DC
  Administered 2019-02-16: 06:00:00 via INTRAVENOUS
  Filled 2019-02-16: qty 1000

## 2019-02-16 MED ORDER — ONDANSETRON HCL 4 MG/2ML IJ SOLN
INTRAMUSCULAR | Status: AC
  Filled 2019-02-16: qty 2

## 2019-02-16 MED ORDER — STERILE WATER FOR IRRIGATION IR SOLN
Status: DC | PRN
  Administered 2019-02-16: 1

## 2019-02-16 MED ORDER — ONDANSETRON HCL 4 MG/2ML IJ SOLN
INTRAMUSCULAR | Status: DC | PRN
  Administered 2019-02-16: 4 mg via INTRAVENOUS

## 2019-02-16 MED ORDER — PHENAZOPYRIDINE HCL 200 MG PO TABS: 200.0000 mg | ORAL_TABLET | Freq: Three times a day (TID) | ORAL | 0 refills | Status: DC | PRN

## 2019-02-16 MED ORDER — LIDOCAINE 2% (20 MG/ML) 5 ML SYRINGE
INTRAMUSCULAR | Status: DC | PRN
  Administered 2019-02-16: 60 mg via INTRAVENOUS

## 2019-02-16 MED ORDER — OXYCODONE HCL 5 MG/5ML PO SOLN
5.0000 mg | Freq: Once | ORAL | Status: AC | PRN
  Filled 2019-02-16: qty 5

## 2019-02-16 MED ORDER — FENTANYL CITRATE (PF) 100 MCG/2ML IJ SOLN
INTRAMUSCULAR | Status: DC | PRN
  Administered 2019-02-16 (×4): 25 ug via INTRAVENOUS

## 2019-02-16 MED ORDER — KETOROLAC TROMETHAMINE 30 MG/ML IJ SOLN
30.0000 mg | Freq: Once | INTRAMUSCULAR | Status: DC | PRN
  Filled 2019-02-16: qty 1

## 2019-02-16 MED ORDER — PROMETHAZINE HCL 25 MG/ML IJ SOLN
6.2500 mg | INTRAMUSCULAR | Status: DC | PRN
  Filled 2019-02-16: qty 1

## 2019-02-16 MED ORDER — FENTANYL CITRATE (PF) 100 MCG/2ML IJ SOLN
25.0000 ug | INTRAMUSCULAR | Status: DC | PRN
  Filled 2019-02-16: qty 1

## 2019-02-16 MED ORDER — IOHEXOL 300 MG/ML  SOLN
INTRAMUSCULAR | Status: DC | PRN
  Administered 2019-02-16: 7 mL

## 2019-02-16 MED ORDER — PROPOFOL 10 MG/ML IV BOLUS
INTRAVENOUS | Status: AC
  Filled 2019-02-16: qty 40

## 2019-02-16 SURGICAL SUPPLY — 55 items
BAG DRAIN URO-CYSTO SKYTR STRL (DRAIN) ×2 IMPLANT
BAG URINE DRAINAGE (UROLOGICAL SUPPLIES) ×2 IMPLANT
BLADE CLIPPER SENSICLIP SURGIC (BLADE) IMPLANT
CATH FOLEY 2WAY SLVR  5CC 16FR (CATHETERS) ×1
CATH FOLEY 2WAY SLVR 5CC 16FR (CATHETERS) ×1 IMPLANT
CATH INTERMIT  6FR 70CM (CATHETERS) IMPLANT
CATH ROBINSON RED A/P 16FR (CATHETERS) ×2 IMPLANT
CATH ROBINSON RED A/P 20FR (CATHETERS) ×2 IMPLANT
CATH URET 5FR 28IN CONE TIP (BALLOONS)
CATH URET 5FR 28IN OPEN ENDED (CATHETERS) IMPLANT
CATH URET 5FR 70CM CONE TIP (BALLOONS) IMPLANT
CLOTH BEACON ORANGE TIMEOUT ST (SAFETY) ×4 IMPLANT
CONT SPECI 4OZ STER CLIK (MISCELLANEOUS) ×4 IMPLANT
COVER BACK TABLE 60X90IN (DRAPES) ×2 IMPLANT
COVER MAYO STAND STRL (DRAPES) ×2 IMPLANT
COVER WAND RF STERILE (DRAPES) ×2 IMPLANT
DRSG TEGADERM 4X4.75 (GAUZE/BANDAGES/DRESSINGS) ×2 IMPLANT
DRSG TEGADERM 8X12 (GAUZE/BANDAGES/DRESSINGS) ×4 IMPLANT
GAUZE SPONGE 4X4 12PLY STRL LF (GAUZE/BANDAGES/DRESSINGS) ×2 IMPLANT
GLOVE BIO SURGEON STRL SZ 6 (GLOVE) ×2 IMPLANT
GLOVE BIO SURGEON STRL SZ 6.5 (GLOVE) IMPLANT
GLOVE BIO SURGEON STRL SZ7 (GLOVE) ×2 IMPLANT
GLOVE BIO SURGEON STRL SZ7.5 (GLOVE) ×2 IMPLANT
GLOVE BIO SURGEON STRL SZ8 (GLOVE) ×2 IMPLANT
GLOVE BIOGEL PI IND STRL 6 (GLOVE) ×1 IMPLANT
GLOVE BIOGEL PI IND STRL 6.5 (GLOVE) ×1 IMPLANT
GLOVE BIOGEL PI IND STRL 7.0 (GLOVE) ×1 IMPLANT
GLOVE BIOGEL PI IND STRL 8 (GLOVE) ×1 IMPLANT
GLOVE BIOGEL PI INDICATOR 6 (GLOVE) ×1
GLOVE BIOGEL PI INDICATOR 6.5 (GLOVE) ×1
GLOVE BIOGEL PI INDICATOR 7.0 (GLOVE) ×1
GLOVE BIOGEL PI INDICATOR 8 (GLOVE) ×1
GLOVE ECLIPSE 8.0 STRL XLNG CF (GLOVE) ×2 IMPLANT
GOWN STRL REUS W/ TWL XL LVL3 (GOWN DISPOSABLE) ×1 IMPLANT
GOWN STRL REUS W/TWL LRG LVL3 (GOWN DISPOSABLE) ×2 IMPLANT
GOWN STRL REUS W/TWL XL LVL3 (GOWN DISPOSABLE) ×3 IMPLANT
GUIDEWIRE ANG ZIPWIRE 038X150 (WIRE) IMPLANT
GUIDEWIRE STR DUAL SENSOR (WIRE) IMPLANT
HOLDER FOLEY CATH W/STRAP (MISCELLANEOUS) IMPLANT
IMPL SPACEOAR SYSTEM 10ML (Spacer) ×1 IMPLANT
IMPLANT SPACEOAR SYSTEM 10ML (Spacer) ×2 IMPLANT
ISEEDAGX100 ×140 IMPLANT
IV SOD CHL 0.9% 1000ML (IV SOLUTION) ×2 IMPLANT
KIT TURNOVER CYSTO (KITS) ×2 IMPLANT
MANIFOLD NEPTUNE II (INSTRUMENTS) IMPLANT
MARKER SKIN DUAL TIP RULER LAB (MISCELLANEOUS) ×2 IMPLANT
NS IRRIG 500ML POUR BTL (IV SOLUTION) IMPLANT
PACK CYSTO (CUSTOM PROCEDURE TRAY) ×2 IMPLANT
SURGILUBE 2OZ TUBE FLIPTOP (MISCELLANEOUS) ×2 IMPLANT
SUT BONE WAX W31G (SUTURE) IMPLANT
SYR 10ML LL (SYRINGE) ×2 IMPLANT
TUBE CONNECTING 12X1/4 (SUCTIONS) IMPLANT
UNDERPAD 30X30 (UNDERPADS AND DIAPERS) ×4 IMPLANT
WATER STERILE IRR 3000ML UROMA (IV SOLUTION) ×2 IMPLANT
WATER STERILE IRR 500ML POUR (IV SOLUTION) ×2 IMPLANT

## 2019-02-16 NOTE — Anesthesia Procedure Notes (Signed)
Procedure Name: LMA Insertion Date/Time: 02/16/2019 7:47 AM Performed by: Wanita Chamberlain, CRNA Pre-anesthesia Checklist: Patient identified, Emergency Drugs available, Suction available, Patient being monitored and Timeout performed Patient Re-evaluated:Patient Re-evaluated prior to induction Oxygen Delivery Method: Circle system utilized Preoxygenation: Pre-oxygenation with 100% oxygen Induction Type: IV induction Ventilation: Mask ventilation without difficulty LMA: LMA inserted LMA Size: 5.0 Number of attempts: 1 Placement Confirmation: breath sounds checked- equal and bilateral,  CO2 detector and positive ETCO2 Tube secured with: Tape Dental Injury: Teeth and Oropharynx as per pre-operative assessment

## 2019-02-16 NOTE — Discharge Instructions (Signed)
°  Post Anesthesia Home Care Instructions  Activity: Get plenty of rest for the remainder of the day. A responsible adult should stay with you for 24 hours following the procedure.  For the next 24 hours, DO NOT: -Drive a car -Paediatric nurse -Drink alcoholic beverages -Take any medication unless instructed by your physician -Make any legal decisions or sign important papers.  Meals: Start with liquid foods such as gelatin or soup. Progress to regular foods as tolerated. Avoid greasy, spicy, heavy foods. If nausea and/or vomiting occur, drink only clear liquids until the nausea and/or vomiting subsides. Call your physician if vomiting continues.  Special Instructions/Symptoms: Your throat may feel dry or sore from the anesthesia or the breathing tube placed in your throat during surgery. If this causes discomfort, gargle with warm salt water. The discomfort should disappear within 24 hours.  If you had a scopolamine patch placed behind your ear for the management of post- operative nausea and/or vomiting:  1. The medication in the patch is effective for 72 hours, after which it should be removed.  Wrap patch in a tissue and discard in the trash. Wash hands thoroughly with soap and water. 2. You may remove the patch earlier than 72 hours if you experience unpleasant side effects which may include dry mouth, dizziness or visual disturbances. 3. Avoid touching the patch. Wash your hands with soap and water after contact with the patch.    No advil, aleve, motrin, ibuprofen until 3 pm today

## 2019-02-16 NOTE — Anesthesia Postprocedure Evaluation (Signed)
Anesthesia Post Note  Patient: Patrick Torres  Procedure(s) Performed: RADIOACTIVE SEED IMPLANT/BRACHYTHERAPY IMPLANT (N/A Prostate) SPACE OAR INSTILLATION (N/A Prostate) CYSTOSCOPY FLEXIBLE (N/A Prostate)     Patient location during evaluation: PACU Anesthesia Type: General Level of consciousness: awake and alert Pain management: pain level controlled Vital Signs Assessment: post-procedure vital signs reviewed and stable Respiratory status: spontaneous breathing, nonlabored ventilation, respiratory function stable and patient connected to nasal cannula oxygen Cardiovascular status: blood pressure returned to baseline and stable Postop Assessment: no apparent nausea or vomiting Anesthetic complications: no    Last Vitals:  Vitals:   02/16/19 0930 02/16/19 0945  BP: 127/79 (!) 137/92  Pulse: 68 (!) 56  Resp: 16 12  Temp:    SpO2: 100% 100%    Last Pain:  Vitals:   02/16/19 0945  TempSrc:   PainSc: 0-No pain                 Colvin Blatt S

## 2019-02-16 NOTE — Op Note (Signed)
PATIENT:  Patrick Torres  PRE-OPERATIVE DIAGNOSIS:  Gleason 4+3=7 adenocarcinoma of the prostate  POST-OPERATIVE DIAGNOSIS:  Same  PROCEDURE:  1. I-125 radioactive seed implantation 2. Cystoscopy  3. Placement of SpaceOAR  SURGEON:  Ellison Hughs, MD  Radiation oncologist: Tyler Pita, MD  ANESTHESIA:  General  EBL:  Minimal  DRAINS: None  INDICATION: Patrick Torres  Description of procedure: After informed consent the patient was brought to the major OR, placed on the table and administered general anesthesia. He was then moved to the modified lithotomy position with his perineum perpendicular to the floor. His perineum and genitalia were then sterilely prepped. An official timeout was then performed. A 16 French Foley catheter was then placed in the bladder and filled with dilute contrast, a rectal tube was placed in the rectum and the transrectal ultrasound probe was placed in the rectum and affixed to the stand. He was then sterilely draped.  Real time ultrasonography was used along with the seed planning software. This was used to develop the seed plan including the number of needles as well as number of seeds required for complete and adequate coverage. Real-time ultrasonography was then used along with the previously developed plan and the Nucletron device to implant a total of 70 seeds using 29 needles. This proceeded without difficulty or complication.   I then proceeded with placement of SpaceOAR by introducing a needle with the bevel angled inferiorly approximately 2 cm superior to the anus. This was angled downward and under direct ultrasound was placed within the space between the prostatic capsule and rectum. This was confirmed with a small amount of sterile saline injected and this was performed under direct ultrasound. I then attached the SpaceOAR to the needle and injected this in the space between the prostate and rectum with good placement noted.  A  Foley catheter was then removed as well as the transrectal ultrasound probe and rectal probe. Flexible cystoscopy was then performed using the 17 French flexible scope which revealed a normal urethra throughout its length down to the sphincter which appeared intact. The prostatic urethra revealed bilobar hypertrophy but no evidence of obstruction, seeds, spacers or lesions. The bladder was then entered and fully and systematically inspected. The ureteral orifices were noted to be of normal configuration and position. The mucosa revealed no evidence of tumors. There were also no stones identified within the bladder. I noted no seeds or spacers on the floor of the bladder and retroflexion of the scope revealed no seeds protruding from the base of the prostate.  The cystoscope was then removed and the patient was awakened and taken to recovery room in stable and satisfactory condition. He tolerated procedure well and there were no intraoperative complications.  Plan: Follow-up on 03/28/19 for post-op assessment

## 2019-02-16 NOTE — Anesthesia Preprocedure Evaluation (Addendum)
Anesthesia Evaluation  Patient identified by MRN, date of birth, ID band Patient awake    Reviewed: Allergy & Precautions, NPO status , Patient's Chart, lab work & pertinent test results  Airway Mallampati: II  TM Distance: >3 FB Neck ROM: Full    Dental no notable dental hx. (+) Edentulous Upper, Edentulous Lower   Pulmonary neg pulmonary ROS,    Pulmonary exam normal breath sounds clear to auscultation       Cardiovascular negative cardio ROS Normal cardiovascular exam Rhythm:Regular Rate:Normal     Neuro/Psych Seizures -, Well Controlled,  negative psych ROS   GI/Hepatic negative GI ROS, Neg liver ROS,   Endo/Other  negative endocrine ROS  Renal/GU negative Renal ROS  negative genitourinary   Musculoskeletal  (+) Arthritis , Osteoarthritis,    Abdominal   Peds negative pediatric ROS (+)  Hematology negative hematology ROS (+)   Anesthesia Other Findings Uses walker to ambulate  Reproductive/Obstetrics negative OB ROS                           Anesthesia Physical Anesthesia Plan  ASA: III  Anesthesia Plan: General   Post-op Pain Management:    Induction: Intravenous  PONV Risk Score and Plan: 2 and Ondansetron, Dexamethasone and Treatment may vary due to age or medical condition  Airway Management Planned: LMA  Additional Equipment:   Intra-op Plan:   Post-operative Plan: Extubation in OR  Informed Consent: I have reviewed the patients History and Physical, chart, labs and discussed the procedure including the risks, benefits and alternatives for the proposed anesthesia with the patient or authorized representative who has indicated his/her understanding and acceptance.     Dental advisory given  Plan Discussed with: CRNA and Surgeon  Anesthesia Plan Comments:         Anesthesia Quick Evaluation

## 2019-02-16 NOTE — Transfer of Care (Signed)
Immediate Anesthesia Transfer of Care Note  Patient: Patrick Torres  Procedure(s) Performed: RADIOACTIVE SEED IMPLANT/BRACHYTHERAPY IMPLANT (N/A Prostate) SPACE OAR INSTILLATION (N/A Prostate) CYSTOSCOPY FLEXIBLE (N/A Prostate)  Patient Location: PACU  Anesthesia Type:General  Level of Consciousness: sedated and patient cooperative  Airway & Oxygen Therapy: Patient Spontanous Breathing and Patient connected to nasal cannula oxygen  Post-op Assessment: Report given to RN and Post -op Vital signs reviewed and stable  Post vital signs: Reviewed and stable  Last Vitals:  Vitals Value Taken Time  BP 128/76 02/16/2019  9:15 AM  Temp    Pulse 54 02/16/2019  9:17 AM  Resp 12 02/16/2019  9:17 AM  SpO2 100 % 02/16/2019  9:17 AM  Vitals shown include unvalidated device data.  Last Pain:  Vitals:   02/16/19 0531  TempSrc: Oral         Complications: No apparent anesthesia complications

## 2019-02-17 ENCOUNTER — Encounter (HOSPITAL_BASED_OUTPATIENT_CLINIC_OR_DEPARTMENT_OTHER): Payer: Self-pay | Admitting: Urology

## 2019-02-17 NOTE — Progress Notes (Signed)
  Radiation Oncology         [phone] ________________________________  Name: TREVELLE MCGURN MRN: [id number]  Date: 02/17/2019  DOB: January 06, 1961       Prostate Seed Implant  RT:[id number], [name], MD  No ref. provider found  DIAGNOSIS: 58 y.o. gentleman with Stage T1c adenocarcinoma of the prostate with Gleason score of 4+3, and PSA of 4.26    ICD-10-CM   1. Prostate cancer ([place]) C61 DG Chest 2 View    DG Chest 2 View    PROCEDURE: Insertion of radioactive I-125 seeds into the prostate gland.  RADIATION DOSE: 145 Gy, definitive therapy.  TECHNIQUE: GRIFFITH SANTILLI was brought to the operating room with the urologist. He was placed in the dorsolithotomy position. He was catheterized and a rectal tube was inserted. The perineum was shaved, prepped and draped. The ultrasound probe was then introduced into the rectum to see the prostate gland.  TREATMENT DEVICE: A needle grid was attached to the ultrasound probe stand and anchor needles were placed.  3D PLANNING: The prostate was imaged in 3D using a sagittal sweep of the prostate probe. These images were transferred to the planning computer. There, the prostate, urethra and rectum were defined on each axial reconstructed image. Then, the software created an optimized 3D plan and a few seed positions were adjusted. The quality of the plan was reviewed using [facility] information for the target and the following two organs at risk:  Urethra and Rectum.  Then the accepted plan was printed and handed off to the radiation therapist.  Under my supervision, the custom loading of the seeds and spacers was carried out and loaded into sealed vicryl sleeves.  These pre-loaded needles were then placed into the needle holder.Marland Kitchen  PROSTATE VOLUME STUDY:  Using transrectal ultrasound the volume of the prostate was verified to be 39 cc.  SPECIAL TREATMENT PROCEDURE/SUPERVISION AND HANDLING: The pre-loaded needles were then delivered under sagittal  guidance. A total of 29 needles were used to deposit 70 seeds in the prostate gland. The individual seed activity was 0.456 mCi.  SpaceOAR:  Yes  COMPLEX SIMULATION: At the end of the procedure, an anterior radiograph of the pelvis was obtained to document seed positioning and count. Cystoscopy was performed to check the urethra and bladder.  MICRODOSIMETRY: At the end of the procedure, the patient was emitting 0.149 mR/hr at 1 meter. Accordingly, he was considered safe for hospital discharge.  PLAN: The patient will return to the radiation oncology clinic for post implant CT dosimetry in three weeks.   ________________________________  Sheral Apley Tammi Klippel, M.D.

## 2019-03-02 ENCOUNTER — Telehealth: Payer: Self-pay | Admitting: Radiation Oncology

## 2019-03-02 NOTE — Telephone Encounter (Signed)
Received voicemail message from patient requesting return call. Phoned patient back. Patient reports his insurance doesn't cover his pyridium prescription. Patient reports his pharmacist told him to purchase AZO "cause its the same thing." Patient expresses concern that the AZO bottle says not to use it for more than two day and he questions if he should even buy it. Explained that the source of his dysuria is known thus taking pyridium for more than two days is ok. Patient verbalized understanding and expressed appreciation for the return call.

## 2019-03-03 ENCOUNTER — Telehealth: Payer: Self-pay | Admitting: *Deleted

## 2019-03-03 NOTE — Telephone Encounter (Signed)
Called patient to remind of post seed appts. for 03-04-19, lvm for a return call

## 2019-03-04 ENCOUNTER — Other Ambulatory Visit: Payer: Self-pay

## 2019-03-04 ENCOUNTER — Other Ambulatory Visit: Payer: Self-pay | Admitting: Urology

## 2019-03-04 ENCOUNTER — Ambulatory Visit (HOSPITAL_COMMUNITY)
Admission: RE | Admit: 2019-03-04 | Discharge: 2019-03-04 | Disposition: A | Discharge status: Home / Self Care | Payer: Medicare Other | Source: Ambulatory Visit | Attending: Urology | Admitting: Urology

## 2019-03-04 ENCOUNTER — Encounter: Payer: Self-pay | Admitting: Urology

## 2019-03-04 ENCOUNTER — Ambulatory Visit
Admission: RE | Admit: 2019-03-04 | Discharge: 2019-03-04 | Disposition: A | Discharge status: Home / Self Care | Payer: Medicare Other | Source: Ambulatory Visit | Attending: Urology | Admitting: Urology

## 2019-03-04 ENCOUNTER — Ambulatory Visit
Admission: RE | Admit: 2019-03-04 | Discharge: 2019-03-04 | Disposition: A | Discharge status: Home / Self Care | Payer: Medicare Other | Source: Ambulatory Visit | Attending: Radiation Oncology | Admitting: Radiation Oncology

## 2019-03-04 VITALS — BP 134/87 | HR 69 | Temp 99.2°F | Resp 18

## 2019-03-04 DIAGNOSIS — Z79899 Other long term (current) drug therapy: Secondary | ICD-10-CM | POA: Diagnosis not present

## 2019-03-04 DIAGNOSIS — R3912 Poor urinary stream: Secondary | ICD-10-CM | POA: Insufficient documentation

## 2019-03-04 DIAGNOSIS — R3911 Hesitancy of micturition: Secondary | ICD-10-CM | POA: Insufficient documentation

## 2019-03-04 DIAGNOSIS — R3915 Urgency of urination: Secondary | ICD-10-CM | POA: Insufficient documentation

## 2019-03-04 DIAGNOSIS — C61 Malignant neoplasm of prostate: Secondary | ICD-10-CM | POA: Insufficient documentation

## 2019-03-04 DIAGNOSIS — Z923 Personal history of irradiation: Secondary | ICD-10-CM | POA: Insufficient documentation

## 2019-03-04 DIAGNOSIS — Z510 Encounter for antineoplastic radiation therapy: Secondary | ICD-10-CM | POA: Diagnosis present

## 2019-03-04 MED ORDER — SILODOSIN 8 MG PO CAPS: 8.0000 mg | ORAL_CAPSULE | Freq: Every day | ORAL | 3 refills | Status: DC

## 2019-03-04 NOTE — Progress Notes (Signed)
Pt here today for a post seed appointment. Pt denies having any pain or fatigue. Pt has a MRI appointment today. Pt states that he has a follow-up with [facility] on 03/28/19. Pt states having mils dysuria. Pt denies having hematuria. Pt denies having any leakage of urine.   BP 134/87   Pulse 69   Temp 99.2 F (37.3 C) (Oral)   Resp 18   SpO2 99%    Wt Readings from Last 3 Encounters:  02/16/19 224 lb (101.6 kg)  11/02/18 217 lb 2 oz (98.5 kg)  09/23/18 217 lb (98.4 kg)

## 2019-03-04 NOTE — Progress Notes (Signed)
Radiation Oncology         [phone] ________________________________  Name: Patrick Torres MRN: [id number]  Date: 03/04/2019  DOB: 04/07/1961  Post-Seed Follow-Up Visit Note  CC: Tsosie Billing, MD  Davis Gourd*  Diagnosis:   58 y.o. gentleman with Stage T1c adenocarcinoma of the prostate with Gleason score of 4+3, and PSA of 4.26    ICD-10-CM   1. Malignant neoplasm of prostate ([place]) C61     Interval Since Last Radiation:  2 weeks Insertion of radioactive I-125 seeds into the prostate gland; 145 Gy, definitive therapy with SpaceOAR gel placement.  Narrative:  The patient returns today for routine follow-up.  He is complaining of increased urinary urgency, intermittency, weak stream and urinary hesitation symptoms.  He specifically denies dysuria or gross hematuria.  He filled out a questionnaire regarding urinary function today providing and overall IPSS score of 18 characterizing his symptoms as moderate.  He is not currently taking any alpha blockers or medications for BPH.  His pre-implant score was 12. He denies any bowel symptoms.  He reports a healthy appetite and is maintaining his weight.  Overall, he reports that his LUTS are gradually improving and he is pleased with his progress to date.  ALLERGIES:  has No Known Allergies.  Meds: Current Outpatient Medications  Medication Sig Dispense Refill  . acetaminophen (TYLENOL) 325 MG tablet Take 650 mg by mouth every 6 (six) hours as needed.    . carbamazepine (TEGRETOL XR) 400 MG 12 hr tablet TAKE 1 TABLET (400 MG TOTAL) BY MOUTH 2 (TWO) TIMES DAILY. (Patient taking differently: Take 400 mg by mouth 2 (two) times daily. ) 180 tablet 3  . levETIRAcetam (KEPPRA) 1000 MG tablet TAKE 1 TABLET (1,000 MG TOTAL) BY MOUTH 2 (TWO) TIMES DAILY. (Patient taking differently: Take 1,000 mg by mouth 2 (two) times daily. ) 180 tablet 3  . Multiple Vitamin (MULTIVITAMIN) capsule Take 1 capsule by mouth daily.    Marland Kitchen  oxybutynin (DITROPAN) 5 MG tablet Take 1 tablet (5 mg total) by mouth every 8 (eight) hours as needed for bladder spasms. 30 tablet 1  . phenazopyridine (PYRIDIUM) 200 MG tablet Take 1 tablet (200 mg total) by mouth 3 (three) times daily as needed (for pain with urination). 30 tablet 0   No current facility-administered medications for this visit.     Physical Findings: In general this is a well appearing African-American male in no acute distress.  He's alert and oriented x4 and appropriate throughout the examination. Cardiopulmonary assessment is negative for acute distress and he exhibits normal effort.   Lab Findings: Lab Results  Component Value Date   WBC 5.0 02/10/2019   HGB 14.2 02/10/2019   HCT 44.1 02/10/2019   MCV 94.8 02/10/2019   PLT 181 02/10/2019    Radiographic Findings:  Patient underwent CT imaging in our clinic for post implant dosimetry. The CT was reviewed by Dr. Tammi Klippel and appears to demonstrate an adequate distribution of radioactive seeds throughout the prostate gland. There are no seeds in or near the rectum.  He is scheduled for an MRI prostate later this afternoon and these images will be fused with his CT images for further evaluation.  We suspect the final radiation plan and dosimetry will show appropriate coverage of the prostate gland.   Impression/Plan: 58 y.o. gentleman with Stage T1c adenocarcinoma of the prostate with Gleason score of 4+3, and PSA of 4.26. The patient is recovering from the effects of radiation. His  urinary symptoms should gradually improve over the next 4-6 months. We talked about this today. He is encouraged by his improvement already and is otherwise pleased with his outcome. We also talked about long-term follow-up for prostate cancer following seed implant. He understands that ongoing PSA determinations and digital rectal exams will help perform surveillance to rule out disease recurrence. He has a follow up appointment scheduled with  Dr. Lovena Neighbours on 03/28/2019. He understands what to expect with his PSA measures. Patient was also educated today about some of the long-term effects from radiation including a small risk for rectal bleeding and possibly erectile dysfunction. We talked about some of the general management approaches to these potential complications. However, I did encourage the patient to contact our office or return at any point if he has questions or concerns related to his previous radiation and prostate cancer.    Nicholos Johns, PA-C

## 2019-04-08 ENCOUNTER — Ambulatory Visit: Payer: Medicare Other | Attending: Radiation Oncology | Admitting: Radiation Oncology

## 2019-04-08 ENCOUNTER — Encounter: Payer: Self-pay | Admitting: Radiation Oncology

## 2019-04-08 DIAGNOSIS — Z510 Encounter for antineoplastic radiation therapy: Secondary | ICD-10-CM | POA: Insufficient documentation

## 2019-04-08 DIAGNOSIS — C61 Malignant neoplasm of prostate: Secondary | ICD-10-CM | POA: Insufficient documentation

## 2019-04-25 NOTE — Progress Notes (Signed)
  Radiation Oncology         [phone] ________________________________  Name: Patrick Torres MRN: [id number]  Date: 04/08/2019  DOB: [birth year]  3D Planning Note   Prostate Brachytherapy Post-Implant Dosimetry  Diagnosis: 58 y.o. gentleman with Stage T1c adenocarcinoma of the prostate with Gleason score of 4+3, and PSA of 4.26.  Narrative: On a previous date, Patrick Torres returned following prostate seed implantation for post implant planning. He underwent CT scan complex simulation to delineate the three-dimensional structures of the pelvis and demonstrate the radiation distribution.  Since that time, the seed localization, and complex isodose planning with dose volume histograms have now been completed.  Results:   Prostate Coverage - The dose of radiation delivered to the 90% or more of the prostate gland (D90) was 101.28% of the prescription dose. This exceeds our goal of greater than 90%. Rectal Sparing - The volume of rectal tissue receiving the prescription dose or higher was 0.0 cc. This falls under our thresholds tolerance of 1.0 cc.  Impression: The prostate seed implant appears to show adequate target coverage and appropriate rectal sparing.  Plan:  The patient will continue to follow with urology for ongoing PSA determinations. I would anticipate a high likelihood for local tumor control with minimal risk for rectal morbidity.  ________________________________  Sheral Apley Tammi Klippel, M.D.

## 2019-05-27 ENCOUNTER — Other Ambulatory Visit: Payer: Self-pay | Admitting: Neurology

## 2019-05-30 ENCOUNTER — Other Ambulatory Visit: Payer: Self-pay

## 2019-05-30 MED ORDER — CARBAMAZEPINE ER 400 MG PO TB12: 400.0000 mg | ORAL_TABLET | Freq: Two times a day (BID) | ORAL | 1 refills | Status: DC

## 2019-05-30 MED ORDER — LEVETIRACETAM 1000 MG PO TABS: 1000.0000 mg | ORAL_TABLET | Freq: Two times a day (BID) | ORAL | 1 refills | Status: DC

## 2019-06-02 ENCOUNTER — Other Ambulatory Visit: Payer: Self-pay | Admitting: Urology

## 2019-06-30 ENCOUNTER — Encounter: Payer: Self-pay | Admitting: *Deleted

## 2019-07-21 ENCOUNTER — Encounter: Payer: Self-pay | Admitting: Internal Medicine

## 2019-07-25 ENCOUNTER — Encounter: Payer: Self-pay | Admitting: Family Medicine

## 2019-09-26 ENCOUNTER — Ambulatory Visit: Payer: Medicare Other | Admitting: Neurology

## 2019-09-26 ENCOUNTER — Other Ambulatory Visit: Payer: Self-pay

## 2019-10-12 ENCOUNTER — Encounter: Payer: Self-pay | Admitting: Neurology

## 2019-10-12 ENCOUNTER — Ambulatory Visit (INDEPENDENT_AMBULATORY_CARE_PROVIDER_SITE_OTHER): Payer: Medicare Other | Admitting: Neurology

## 2019-10-12 ENCOUNTER — Other Ambulatory Visit: Payer: Self-pay

## 2019-10-12 VITALS — BP 115/72 | HR 63 | Temp 98.0°F | Wt 224.0 lb

## 2019-10-12 DIAGNOSIS — G40909 Epilepsy, unspecified, not intractable, without status epilepticus: Secondary | ICD-10-CM | POA: Diagnosis not present

## 2019-10-12 MED ORDER — LEVETIRACETAM 1000 MG PO TABS: 1000.0000 mg | ORAL_TABLET | Freq: Two times a day (BID) | ORAL | 3 refills | Status: DC

## 2019-10-12 MED ORDER — CARBAMAZEPINE ER 400 MG PO TB12: 400.0000 mg | ORAL_TABLET | Freq: Two times a day (BID) | ORAL | 3 refills | Status: DC
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
## 2019-10-12 NOTE — Patient Instructions (Signed)
I had a long discussion the patient with regards to his remote traumatic brain injury and seizure disorder which appear stable. Continue carbamazepine in the current dose of 400 mg twice daily and Keppra 1 gm  twice daily.Check carbamezapine level, CBC and CMP today. Patient was given refills to last him a year. He was advised to use a cane or walker at all times to prevent falls and injuries. I have counseled him to be compliant with his seizure medications and to avoid seizure provoking stimuli.

## 2019-10-13 ENCOUNTER — Telehealth: Payer: Self-pay

## 2019-10-13 NOTE — Telephone Encounter (Signed)
I called pt that his CBC and CMP were all normal. The carbamazepine level is not back.The pt verbalized understanding.  ------

## 2019-10-13 NOTE — Telephone Encounter (Signed)
-----   Message from Garvin Fila, MD sent at 10/13/2019  3:18 PM EDT ----- Mitchell Heir inform the patient that CBC and comprehensive metabolic panel labs were both normal.  Carbamazepine level is not back yet

## 2019-10-14 LAB — CBC WITH DIFFERENTIAL
Basophils Absolute: 0.1 10*3/uL (ref 0.0–0.2)
Basos: 1 %
EOS (ABSOLUTE): 0.6 10*3/uL — ABNORMAL HIGH (ref 0.0–0.4)
Eos: 13 %
Hematocrit: 43.7 % (ref 37.5–51.0)
Hemoglobin: 14.9 g/dL (ref 13.0–17.7)
Immature Grans (Abs): 0 10*3/uL (ref 0.0–0.1)
Immature Granulocytes: 0 %
Lymphocytes Absolute: 1.3 10*3/uL (ref 0.7–3.1)
Lymphs: 30 %
MCH: 30.5 pg (ref 26.6–33.0)
MCHC: 34.1 g/dL (ref 31.5–35.7)
MCV: 90 fL (ref 79–97)
Monocytes Absolute: 0.6 10*3/uL (ref 0.1–0.9)
Monocytes: 13 %
Neutrophils Absolute: 1.8 10*3/uL (ref 1.4–7.0)
Neutrophils: 43 %
RBC: 4.88 x10E6/uL (ref 4.14–5.80)
RDW: 12.9 % (ref 11.6–15.4)
WBC: 4.3 10*3/uL (ref 3.4–10.8)

## 2019-10-14 LAB — COMPREHENSIVE METABOLIC PANEL
ALT: 20 IU/L (ref 0–44)
AST: 16 IU/L (ref 0–40)
Albumin/Globulin Ratio: 1.3 (ref 1.2–2.2)
Albumin: 4 g/dL (ref 3.8–4.9)
Alkaline Phosphatase: 59 IU/L (ref 39–117)
BUN/Creatinine Ratio: 10 (ref 10–24)
BUN: 11 mg/dL (ref 8–27)
Bilirubin Total: 0.3 mg/dL (ref 0.0–1.2)
CO2: 22 mmol/L (ref 20–29)
Calcium: 9.3 mg/dL (ref 8.6–10.2)
Chloride: 104 mmol/L (ref 96–106)
Creatinine, Ser: 1.06 mg/dL (ref 0.76–1.27)
GFR calc Af Amer: 88 mL/min/{1.73_m2} (ref >59)
GFR calc non Af Amer: 76 mL/min/{1.73_m2} (ref >59)
Globulin, Total: 3.2 g/dL (ref 1.5–4.5)
Glucose: 86 mg/dL (ref 65–99)
Potassium: 4.1 mmol/L (ref 3.5–5.2)
Sodium: 139 mmol/L (ref 134–144)
Total Protein: 7.2 g/dL (ref 6.0–8.5)

## 2019-10-14 LAB — CARBAMAZEPINE LEVEL, TOTAL: Carbamazepine (Tegretol), S: 10.3 ug/mL (ref 4.0–12.0)

## 2020-03-03 ENCOUNTER — Ambulatory Visit: Payer: Medicare Other | Attending: Internal Medicine

## 2020-03-03 DIAGNOSIS — Z23 Encounter for immunization: Secondary | ICD-10-CM

## 2020-03-03 NOTE — Progress Notes (Signed)
   Covid-19 Vaccination Clinic  Name:  Patrick Torres    MRN: [id number] DOB: 1961-11-24  03/03/2020  [name] was observed post Covid-19 immunization for 15 minutes without incident. He was provided with Vaccine Information Sheet and instruction to access the V-Safe system.   [name] was instructed to call 911 with any severe reactions post vaccine: Marland Kitchen Difficulty breathing  . Swelling of face and throat  . A fast heartbeat  . A bad rash all over body  . Dizziness and weakness   Immunizations Administered    Name Date Dose VIS Date Route   Pfizer COVID-19 Vaccine 03/03/2020  3:02 PM 0.3 mL 12/02/2019 Intramuscular   Manufacturer: [place]   Lot: [id number]   [place]: [id number]

## 2020-03-27 ENCOUNTER — Ambulatory Visit: Payer: Medicare Other | Attending: Internal Medicine

## 2020-03-27 DIAGNOSIS — Z23 Encounter for immunization: Secondary | ICD-10-CM

## 2020-03-27 NOTE — Progress Notes (Signed)
   Covid-19 Vaccination Clinic  Name:  Patrick Torres    MRN: [id number] DOB: 1961-08-31  03/27/2020  [name] was observed post Covid-19 immunization for 15 minutes without incident. He was provided with Vaccine Information Sheet and instruction to access the V-Safe system.   [name] was instructed to call 911 with any severe reactions post vaccine: Marland Kitchen Difficulty breathing  . Swelling of face and throat  . A fast heartbeat  . A bad rash all over body  . Dizziness and weakness   Immunizations Administered    Name Date Dose VIS Date Route   Pfizer COVID-19 Vaccine 03/27/2020  4:11 PM 0.3 mL 12/02/2019 Intramuscular   Manufacturer: [employer]   Lot: [id number]   [place]: [id number]

## 2020-04-25 ENCOUNTER — Encounter: Payer: Self-pay | Admitting: Gastroenterology

## 2020-05-30 ENCOUNTER — Encounter: Payer: Self-pay | Admitting: Gastroenterology

## 2020-05-30 ENCOUNTER — Ambulatory Visit: Payer: Medicare Other | Admitting: Gastroenterology

## 2020-05-30 VITALS — BP 126/84 | HR 58 | Ht 68.0 in | Wt 214.6 lb

## 2020-05-30 DIAGNOSIS — Z86010 Personal history of colonic polyps: Secondary | ICD-10-CM | POA: Diagnosis not present

## 2020-05-30 DIAGNOSIS — K5900 Constipation, unspecified: Secondary | ICD-10-CM

## 2020-05-30 NOTE — Patient Instructions (Signed)
Start over the counter Miralax twice daily every day. You can increase or decrease depending on your bowel movements.  Stop taking Amitiza. You can take this as needed if Miralax does not help with constipation.   You will be due for a recall colonoscopy in 08/2020. We will send you a reminder in the mail when it gets closer to that time.
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
## 2020-07-15 ENCOUNTER — Inpatient Hospital Stay (HOSPITAL_COMMUNITY)
Admission: EM | Admit: 2020-07-15 | Discharge: 2020-07-18 | DRG: 101 | Disposition: A | Discharge status: Home Health | Payer: Medicare Other | Attending: Internal Medicine | Admitting: Internal Medicine

## 2020-07-15 ENCOUNTER — Encounter (HOSPITAL_COMMUNITY): Payer: Self-pay | Admitting: *Deleted

## 2020-07-15 ENCOUNTER — Observation Stay (HOSPITAL_COMMUNITY): Payer: Medicare Other

## 2020-07-15 ENCOUNTER — Emergency Department (HOSPITAL_COMMUNITY): Payer: Medicare Other

## 2020-07-15 ENCOUNTER — Other Ambulatory Visit: Payer: Self-pay

## 2020-07-15 DIAGNOSIS — R7401 Elevation of levels of liver transaminase levels: Secondary | ICD-10-CM | POA: Diagnosis present

## 2020-07-15 DIAGNOSIS — S069X9S Unspecified intracranial injury with loss of consciousness of unspecified duration, sequela: Secondary | ICD-10-CM

## 2020-07-15 DIAGNOSIS — G40909 Epilepsy, unspecified, not intractable, without status epilepticus: Secondary | ICD-10-CM | POA: Diagnosis not present

## 2020-07-15 DIAGNOSIS — Z20822 Contact with and (suspected) exposure to covid-19: Secondary | ICD-10-CM | POA: Diagnosis present

## 2020-07-15 DIAGNOSIS — M170 Bilateral primary osteoarthritis of knee: Secondary | ICD-10-CM | POA: Diagnosis present

## 2020-07-15 DIAGNOSIS — Z8249 Family history of ischemic heart disease and other diseases of the circulatory system: Secondary | ICD-10-CM

## 2020-07-15 DIAGNOSIS — R748 Abnormal levels of other serum enzymes: Secondary | ICD-10-CM | POA: Diagnosis present

## 2020-07-15 DIAGNOSIS — E778 Other disorders of glycoprotein metabolism: Secondary | ICD-10-CM | POA: Diagnosis present

## 2020-07-15 DIAGNOSIS — R17 Unspecified jaundice: Secondary | ICD-10-CM | POA: Diagnosis present

## 2020-07-15 DIAGNOSIS — Z79899 Other long term (current) drug therapy: Secondary | ICD-10-CM

## 2020-07-15 DIAGNOSIS — E872 Acidosis: Secondary | ICD-10-CM | POA: Diagnosis present

## 2020-07-15 DIAGNOSIS — Z87442 Personal history of urinary calculi: Secondary | ICD-10-CM

## 2020-07-15 DIAGNOSIS — R569 Unspecified convulsions: Secondary | ICD-10-CM

## 2020-07-15 DIAGNOSIS — Z923 Personal history of irradiation: Secondary | ICD-10-CM

## 2020-07-15 DIAGNOSIS — G9349 Other encephalopathy: Secondary | ICD-10-CM | POA: Diagnosis present

## 2020-07-15 DIAGNOSIS — Y9355 Activity, bike riding: Secondary | ICD-10-CM

## 2020-07-15 DIAGNOSIS — K5909 Other constipation: Secondary | ICD-10-CM | POA: Diagnosis present

## 2020-07-15 DIAGNOSIS — R109 Unspecified abdominal pain: Secondary | ICD-10-CM

## 2020-07-15 DIAGNOSIS — R350 Frequency of micturition: Secondary | ICD-10-CM | POA: Diagnosis present

## 2020-07-15 DIAGNOSIS — Z86010 Personal history of colonic polyps: Secondary | ICD-10-CM

## 2020-07-15 DIAGNOSIS — G8114 Spastic hemiplegia affecting left nondominant side: Secondary | ICD-10-CM | POA: Diagnosis present

## 2020-07-15 DIAGNOSIS — C61 Malignant neoplasm of prostate: Secondary | ICD-10-CM | POA: Diagnosis present

## 2020-07-15 DIAGNOSIS — E8809 Other disorders of plasma-protein metabolism, not elsewhere classified: Secondary | ICD-10-CM | POA: Diagnosis present

## 2020-07-15 HISTORY — DX: Unspecified convulsions: R56.9

## 2020-07-15 LAB — CARBAMAZEPINE LEVEL, TOTAL: Carbamazepine Lvl: 6.9 ug/mL (ref 4.0–12.0)

## 2020-07-15 LAB — CBC WITH DIFFERENTIAL/PLATELET
Abs Immature Granulocytes: 0.03 10*3/uL (ref 0.00–0.07)
Basophils Absolute: 0 10*3/uL (ref 0.0–0.1)
Basophils Relative: 1 %
Eosinophils Absolute: 0.1 10*3/uL (ref 0.0–0.5)
Eosinophils Relative: 1 %
HCT: 43.1 % (ref 39.0–52.0)
Hemoglobin: 14.3 g/dL (ref 13.0–17.0)
Immature Granulocytes: 1 %
Lymphocytes Relative: 12 %
Lymphs Abs: 0.7 10*3/uL (ref 0.7–4.0)
MCH: 30.1 pg (ref 26.0–34.0)
MCHC: 33.2 g/dL (ref 30.0–36.0)
MCV: 90.7 fL (ref 80.0–100.0)
Monocytes Absolute: 0.9 10*3/uL (ref 0.1–1.0)
Monocytes Relative: 14 %
Neutro Abs: 4.7 10*3/uL (ref 1.7–7.7)
Neutrophils Relative %: 71 %
Platelets: 207 10*3/uL (ref 150–400)
RBC: 4.75 MIL/uL (ref 4.22–5.81)
RDW: 13.1 % (ref 11.5–15.5)
WBC: 6.4 10*3/uL (ref 4.0–10.5)
nRBC: 0 % (ref 0.0–0.2)

## 2020-07-15 LAB — RAPID URINE DRUG SCREEN, HOSP PERFORMED
Amphetamines: NOT DETECTED
Barbiturates: NOT DETECTED
Benzodiazepines: NOT DETECTED
Cocaine: NOT DETECTED
Opiates: NOT DETECTED
Tetrahydrocannabinol: NOT DETECTED

## 2020-07-15 LAB — SARS CORONAVIRUS 2 BY RT PCR (HOSPITAL ORDER, PERFORMED IN ~~LOC~~ HOSPITAL LAB): SARS Coronavirus 2: NEGATIVE

## 2020-07-15 LAB — BASIC METABOLIC PANEL
Anion gap: 13 (ref 5–15)
BUN: 7 mg/dL (ref 6–20)
CO2: 19 mmol/L — ABNORMAL LOW (ref 22–32)
Calcium: 9.3 mg/dL (ref 8.9–10.3)
Chloride: 106 mmol/L (ref 98–111)
Creatinine, Ser: 1.2 mg/dL (ref 0.61–1.24)
GFR calc Af Amer: >60 mL/min (ref >60)
GFR calc non Af Amer: >60 mL/min (ref >60)
Glucose, Bld: 106 mg/dL — ABNORMAL HIGH (ref 70–99)
Potassium: 3.8 mmol/L (ref 3.5–5.1)
Sodium: 138 mmol/L (ref 135–145)

## 2020-07-15 LAB — CBG MONITORING, ED
Glucose-Capillary: 105 mg/dL — ABNORMAL HIGH (ref 70–99)
Glucose-Capillary: 106 mg/dL — ABNORMAL HIGH (ref 70–99)

## 2020-07-15 LAB — LACTIC ACID, PLASMA: Lactic Acid, Venous: 5.4 mmol/L (ref 0.5–1.9)

## 2020-07-15 MED ORDER — LORAZEPAM 2 MG/ML IJ SOLN: 1.0000 mg | INTRAMUSCULAR | Status: DC | PRN

## 2020-07-15 MED ORDER — ACETAMINOPHEN 325 MG PO TABS: 650.0000 mg | ORAL_TABLET | ORAL | Status: DC | PRN

## 2020-07-15 MED ORDER — LEVETIRACETAM 500 MG PO TABS: 1000.0000 mg | ORAL_TABLET | Freq: Two times a day (BID) | ORAL | Status: DC

## 2020-07-15 MED ORDER — LEVETIRACETAM IN NACL 1000 MG/100ML IV SOLN
1000.0000 mg | Freq: Once | INTRAVENOUS | Status: AC
  Administered 2020-07-15: 1000 mg via INTRAVENOUS
  Filled 2020-07-15: qty 100

## 2020-07-15 MED ORDER — ACETAMINOPHEN 650 MG RE SUPP: 650.0000 mg | RECTAL | Status: DC | PRN

## 2020-07-15 MED ORDER — ZIPRASIDONE MESYLATE 20 MG IM SOLR
10.0000 mg | Freq: Once | INTRAMUSCULAR | Status: AC
  Administered 2020-07-15: 10 mg via INTRAMUSCULAR
  Filled 2020-07-15: qty 20

## 2020-07-15 MED ORDER — ENOXAPARIN SODIUM 40 MG/0.4ML ~~LOC~~ SOLN
40.0000 mg | SUBCUTANEOUS | Status: DC
  Administered 2020-07-16 – 2020-07-17 (×3): 40 mg via SUBCUTANEOUS
  Filled 2020-07-15 (×3): qty 0.4

## 2020-07-15 MED ORDER — CARBAMAZEPINE ER 200 MG PO TB12
400.0000 mg | ORAL_TABLET | Freq: Two times a day (BID) | ORAL | Status: DC
  Filled 2020-07-15 (×2): qty 2

## 2020-07-15 MED ORDER — LACTATED RINGERS IV BOLUS
1000.0000 mL | Freq: Once | INTRAVENOUS | Status: AC
  Administered 2020-07-16: 1000 mL via INTRAVENOUS

## 2020-07-15 MED ORDER — SODIUM CHLORIDE 0.9% FLUSH
10.0000 mL | INTRAVENOUS | Status: DC | PRN
  Administered 2020-07-16: 10 mL

## 2020-07-15 MED ORDER — LORAZEPAM 2 MG/ML IJ SOLN: 1.0000 mg | Freq: Once | INTRAMUSCULAR | Status: DC | PRN

## 2020-07-15 MED ORDER — BACITRACIN ZINC 500 UNIT/GM EX OINT
1.0000 "application " | TOPICAL_OINTMENT | Freq: Once | CUTANEOUS | Status: DC
  Filled 2020-07-15 (×2): qty 28.4

## 2020-07-15 MED ORDER — SODIUM CHLORIDE 0.9% FLUSH
10.0000 mL | Freq: Two times a day (BID) | INTRAVENOUS | Status: DC
  Administered 2020-07-16 – 2020-07-18 (×4): 10 mL

## 2020-07-15 MED ORDER — LORAZEPAM 2 MG/ML IJ SOLN
2.0000 mg | Freq: Once | INTRAMUSCULAR | Status: AC
  Administered 2020-07-15: 2 mg via INTRAVENOUS

## 2020-07-15 NOTE — ED Provider Notes (Signed)
[facility] EMERGENCY DEPARTMENT Provider Note   CSN: [id number] Arrival date & time: 07/15/20  1912     History Chief Complaint  Patient presents with   Seizures    Patrick Torres is a 59 y.o. male.  HPI   Patient presents to the ED for evaluation of a seizure.  Patient has history of seizure disorder as well as a subsequent traumatic brain injury.  Apparently was found by friends who indicated that he had a seizure about 2 hours ago.  Patient was noted to have small laceration on his face.  Since that time the patient has been confused.  He is awake and answering questions now.  Patient states he did have a seizure.  He is not able to tell me however what he was doing beforehand.  He denies any pain or discomfort.  Past Medical History:  Diagnosis Date   Seizures ([place])    Seizures ([place])    TBI (traumatic brain injury) ([place])     There are no problems to display for this patient.   History reviewed. No pertinent surgical history.     No family history on file.  Social History   Tobacco Use   Smoking status: Not on file  Substance Use Topics   Alcohol use: Not on file   Drug use: Not on file    Home Medications Prior to Admission medications   Not on File    Allergies    Patient has no allergy information on record.  Review of Systems   Review of Systems  All other systems reviewed and are negative.   Physical Exam Updated Vital Signs BP (!) 136/87 (BP Location: Right Arm)    Pulse 70    Temp 98.3 F (36.8 C) (Oral)    Resp 13    SpO2 99%   Physical Exam Vitals and nursing note reviewed.  Constitutional:      General: He is not in acute distress.    Appearance: He is well-developed.  HENT:     Head: Normocephalic.     Comments: Evidence of old traumatic brain injury, small proximally half centimeter wedge-shaped laceration left cheek    Right Ear: External ear normal.     Left Ear: External ear normal.  Eyes:      General: No scleral icterus.       Right eye: No discharge.        Left eye: No discharge.     Conjunctiva/sclera: Conjunctivae normal.  Neck:     Trachea: No tracheal deviation.  Cardiovascular:     Rate and Rhythm: Normal rate and regular rhythm.  Pulmonary:     Effort: Pulmonary effort is normal. No respiratory distress.     Breath sounds: Normal breath sounds. No stridor. No wheezing or rales.  Abdominal:     General: Bowel sounds are normal. There is no distension.     Palpations: Abdomen is soft.     Tenderness: There is no abdominal tenderness. There is no guarding or rebound.  Musculoskeletal:        General: No tenderness.     Cervical back: Neck supple.  Skin:    General: Skin is warm and dry.     Findings: No rash.  Neurological:     Mental Status: He is alert.     Cranial Nerves: No cranial nerve deficit (no facial droop, extraocular movements intact, no slurred speech).     Sensory: No sensory deficit.  Motor: No abnormal muscle tone or seizure activity.     Coordination: Coordination normal.     Comments: Patient is slow to answer questions.  He is confused.  He will lift his hands and squeeze my fingers when asked to.  I asked him to move his feet and he will answer yes but then does not do so.  He does keep his legs held up against gravity when I lift them for him.  No aphasia     ED Results / Procedures / Treatments   Labs (all labs ordered are listed, but only abnormal results are displayed) Labs Reviewed  BASIC METABOLIC PANEL - Abnormal; Notable for the following components:      Result Value   CO2 19 (*)    Glucose, Bld 106 (*)    All other components within normal limits  LACTIC ACID, PLASMA - Abnormal; Notable for the following components:   Lactic Acid, Venous 5.4 (*)    All other components within normal limits  CBG MONITORING, ED - Abnormal; Notable for the following components:   Glucose-Capillary 105 (*)    All other components within normal  limits  CBG MONITORING, ED - Abnormal; Notable for the following components:   Glucose-Capillary 106 (*)    All other components within normal limits  SARS CORONAVIRUS 2 BY RT PCR (HOSPITAL ORDER, [place] [facility])  CBC WITH DIFFERENTIAL/PLATELET  RAPID URINE DRUG SCREEN, HOSP PERFORMED  ETHANOL  CARBAMAZEPINE LEVEL, TOTAL  LEVETIRACETAM LEVEL    EKG EKG Interpretation  Date/Time:  Sunday July 15 2020 19:44:29 EDT Ventricular Rate:  68 PR Interval:    QRS Duration: 78 QT Interval:  379 QTC Calculation: 403 R Axis:   39 Text Interpretation: Sinus rhythm Short PR interval Borderline repolarization abnormality No old tracing to compare Confirmed by Dorie Rank [phone]) on 07/15/2020 7:46:22 PM   Radiology CT Head Wo Contrast  Result Date: 07/15/2020 CLINICAL DATA:  Recent seizure activity EXAM: CT HEAD WITHOUT CONTRAST TECHNIQUE: Contiguous axial images were obtained from the base of the skull through the vertex without intravenous contrast. COMPARISON:  09/20/2018 FINDINGS: Brain: Atrophic changes are again identified and stable. The examination is significantly limited by patient motion artifact despite multiple repeated attempts. Scattered areas of chronic white matter ischemic change are noted. Diffuse falx calcifications are seen. Vascular: No hyperdense vessel or unexpected calcification. Skull: Normal. Negative for fracture or focal lesion. Sinuses/Orbits: No acute finding. Other: None. IMPRESSION: Chronic atrophic and ischemic changes. Significantly limited exam due to patient motion artifact. Electronically Signed   By: Inez Catalina M.D.   On: 07/15/2020 21:35    Procedures .Critical Care Performed by: Dorie Rank, MD Authorized by: Dorie Rank, MD   Critical care provider statement:    Critical care time (minutes):  45   Critical care was time spent personally by me on the following activities:  Discussions with consultants, evaluation of patient's  response to treatment, examination of patient, ordering and performing treatments and interventions, ordering and review of laboratory studies, ordering and review of radiographic studies, pulse oximetry, re-evaluation of patient's condition, obtaining history from patient or surrogate and review of old charts   (including critical care time)  Medications Ordered in ED Medications  LORazepam (ATIVAN) injection 1 mg (has no administration in time range)  sodium chloride flush (NS) 0.9 % injection 10-40 mL (has no administration in time range)  sodium chloride flush (NS) 0.9 % injection 10-40 mL (has no administration in time  range)  LORazepam (ATIVAN) injection 2 mg (2 mg Intravenous Given 07/15/20 2135)  levETIRAcetam (KEPPRA) IVPB 1000 mg/100 mL premix (0 mg Intravenous Stopped 07/15/20 2213)  ziprasidone (GEODON) injection 10 mg (10 mg Intramuscular Given 07/15/20 2159)    ED Course  I have reviewed the triage vital signs and the nursing notes.  Pertinent labs & imaging results that were available during my care of the patient were reviewed by me and considered in my medical decision making (see chart for details).  Clinical Course as of Jul 15 2224  Sun Jul 15, 2020  2050 Patient had another seizure while in the ED.  2 mg of Ativan IV ordered.  Patient was previously given Ativan IM while IV was being placed   [JK]  2148 Patient has remained agitated.  He has been trying to pull   [JK]    Clinical Course User Index [JK] Dorie Rank, MD   MDM Rules/Calculators/A&P                          Patient presented to the ED for evaluation of a seizure.  Patient has known history of seizure disorder as well as traumatic brain injury.  In the ED the patient had another seizure.  Does not appear to be status as he has been able to speak and follow some commands after the last seizure but he has not been back to his baseline.  He has remained somewhat postictal and combative.  No signs of acute  infection.  No acute findings on head CT.  Patient was started on Keppra and given the subsequent doses of Ativan.  Have consulted with neurology Dr. Leonel Ramsay.  I will also consulted the medical service for admission and further treatment. Final Clinical Impression(s) / ED Diagnoses Final diagnoses:  Seizure [facility])      Dorie Rank, MD 07/15/20 2227
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
## 2020-07-15 NOTE — ED Notes (Signed)
The pt has had 2mg  of ativan x3  30 minutes apart  He continues to fight [place]

## 2020-07-15 NOTE — ED Notes (Signed)
Ativan 2mg  im lt thigh per order dr Tomi Bamberger

## 2020-07-15 NOTE — ED Notes (Signed)
After the geodon im the pt does not need the restraints at present

## 2020-07-15 NOTE — ED Notes (Signed)
To ct and back

## 2020-07-15 NOTE — ED Notes (Addendum)
Admitting doctors at bedside and dr Leonel Ramsay has seen the pt

## 2020-07-15 NOTE — ED Notes (Signed)
Pt still confused mis adentified now has correct name confused and disoriented
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
## 2020-07-16 ENCOUNTER — Observation Stay (HOSPITAL_COMMUNITY): Payer: Medicare Other

## 2020-07-16 DIAGNOSIS — R748 Abnormal levels of other serum enzymes: Secondary | ICD-10-CM | POA: Diagnosis present

## 2020-07-16 DIAGNOSIS — R350 Frequency of micturition: Secondary | ICD-10-CM | POA: Diagnosis present

## 2020-07-16 DIAGNOSIS — G9349 Other encephalopathy: Secondary | ICD-10-CM | POA: Diagnosis present

## 2020-07-16 DIAGNOSIS — C61 Malignant neoplasm of prostate: Secondary | ICD-10-CM | POA: Diagnosis present

## 2020-07-16 DIAGNOSIS — Z923 Personal history of irradiation: Secondary | ICD-10-CM | POA: Diagnosis not present

## 2020-07-16 DIAGNOSIS — Y9355 Activity, bike riding: Secondary | ICD-10-CM | POA: Diagnosis not present

## 2020-07-16 DIAGNOSIS — M170 Bilateral primary osteoarthritis of knee: Secondary | ICD-10-CM | POA: Diagnosis present

## 2020-07-16 DIAGNOSIS — K5909 Other constipation: Secondary | ICD-10-CM | POA: Diagnosis present

## 2020-07-16 DIAGNOSIS — E8809 Other disorders of plasma-protein metabolism, not elsewhere classified: Secondary | ICD-10-CM | POA: Diagnosis present

## 2020-07-16 DIAGNOSIS — E778 Other disorders of glycoprotein metabolism: Secondary | ICD-10-CM | POA: Diagnosis present

## 2020-07-16 DIAGNOSIS — Z87442 Personal history of urinary calculi: Secondary | ICD-10-CM | POA: Diagnosis not present

## 2020-07-16 DIAGNOSIS — G40909 Epilepsy, unspecified, not intractable, without status epilepticus: Secondary | ICD-10-CM | POA: Diagnosis present

## 2020-07-16 DIAGNOSIS — R17 Unspecified jaundice: Secondary | ICD-10-CM | POA: Diagnosis present

## 2020-07-16 DIAGNOSIS — Z86010 Personal history of colonic polyps: Secondary | ICD-10-CM | POA: Diagnosis not present

## 2020-07-16 DIAGNOSIS — E872 Acidosis: Secondary | ICD-10-CM | POA: Diagnosis present

## 2020-07-16 DIAGNOSIS — S069X9S Unspecified intracranial injury with loss of consciousness of unspecified duration, sequela: Secondary | ICD-10-CM | POA: Diagnosis not present

## 2020-07-16 DIAGNOSIS — G8114 Spastic hemiplegia affecting left nondominant side: Secondary | ICD-10-CM | POA: Diagnosis present

## 2020-07-16 DIAGNOSIS — R569 Unspecified convulsions: Secondary | ICD-10-CM | POA: Diagnosis present

## 2020-07-16 DIAGNOSIS — Z20822 Contact with and (suspected) exposure to covid-19: Secondary | ICD-10-CM | POA: Diagnosis present

## 2020-07-16 DIAGNOSIS — Z79899 Other long term (current) drug therapy: Secondary | ICD-10-CM | POA: Diagnosis not present

## 2020-07-16 DIAGNOSIS — R7401 Elevation of levels of liver transaminase levels: Secondary | ICD-10-CM | POA: Diagnosis present

## 2020-07-16 DIAGNOSIS — Z8249 Family history of ischemic heart disease and other diseases of the circulatory system: Secondary | ICD-10-CM | POA: Diagnosis not present

## 2020-07-16 LAB — MRSA PCR SCREENING: MRSA by PCR: NEGATIVE

## 2020-07-16 LAB — HEPATIC FUNCTION PANEL
ALT: 28 U/L (ref 0–44)
AST: 59 U/L — ABNORMAL HIGH (ref 15–41)
Albumin: 3.3 g/dL — ABNORMAL LOW (ref 3.5–5.0)
Alkaline Phosphatase: 43 U/L (ref 38–126)
Bilirubin, Direct: 1 mg/dL — ABNORMAL HIGH (ref 0.0–0.2)
Indirect Bilirubin: 1 mg/dL — ABNORMAL HIGH (ref 0.3–0.9)
Total Bilirubin: 2 mg/dL — ABNORMAL HIGH (ref 0.3–1.2)
Total Protein: 6.3 g/dL — ABNORMAL LOW (ref 6.5–8.1)

## 2020-07-16 LAB — URINALYSIS, ROUTINE W REFLEX MICROSCOPIC
Bilirubin Urine: NEGATIVE
Glucose, UA: NEGATIVE mg/dL
Ketones, ur: 5 mg/dL — AB
Nitrite: NEGATIVE
Protein, ur: 30 mg/dL — AB
Specific Gravity, Urine: 1.014 (ref 1.005–1.030)
pH: 7 (ref 5.0–8.0)

## 2020-07-16 LAB — LACTIC ACID, PLASMA: Lactic Acid, Venous: 1.6 mmol/L (ref 0.5–1.9)

## 2020-07-16 LAB — ETHANOL: Alcohol, Ethyl (B): <10 mg/dL (ref <10)

## 2020-07-16 LAB — CK: Total CK: 779 U/L — ABNORMAL HIGH (ref 49–397)

## 2020-07-16 MED ORDER — LEVETIRACETAM 750 MG PO TABS: 1500.0000 mg | ORAL_TABLET | Freq: Two times a day (BID) | ORAL | Status: DC

## 2020-07-16 MED ORDER — TAMSULOSIN HCL 0.4 MG PO CAPS
0.4000 mg | ORAL_CAPSULE | Freq: Two times a day (BID) | ORAL | Status: DC
  Administered 2020-07-16 – 2020-07-18 (×4): 0.4 mg via ORAL
  Filled 2020-07-16 (×4): qty 1

## 2020-07-16 MED ORDER — CARBAMAZEPINE ER 200 MG PO TB12
400.0000 mg | ORAL_TABLET | Freq: Two times a day (BID) | ORAL | Status: DC
  Filled 2020-07-16 (×2): qty 2

## 2020-07-16 MED ORDER — LEVETIRACETAM IN NACL 1500 MG/100ML IV SOLN
1500.0000 mg | Freq: Two times a day (BID) | INTRAVENOUS | Status: DC
  Administered 2020-07-16: 1500 mg via INTRAVENOUS
  Filled 2020-07-16: qty 100

## 2020-07-16 MED ORDER — LEVETIRACETAM 750 MG PO TABS
1500.0000 mg | ORAL_TABLET | Freq: Two times a day (BID) | ORAL | Status: DC
  Administered 2020-07-16 – 2020-07-18 (×4): 1500 mg via ORAL
  Filled 2020-07-16 (×4): qty 2

## 2020-07-16 MED ORDER — LUBIPROSTONE 24 MCG PO CAPS
24.0000 ug | ORAL_CAPSULE | Freq: Every day | ORAL | Status: DC | PRN
  Administered 2020-07-16: 24 ug via ORAL
  Filled 2020-07-16 (×2): qty 1

## 2020-07-16 MED ORDER — CARBAMAZEPINE ER 200 MG PO TB12
400.0000 mg | ORAL_TABLET | Freq: Two times a day (BID) | ORAL | Status: DC
  Administered 2020-07-16 – 2020-07-18 (×4): 400 mg via ORAL
  Filled 2020-07-16 (×6): qty 2

## 2020-07-16 MED ORDER — LACTATED RINGERS IV SOLN: INTRAVENOUS | Status: AC

## 2020-07-16 MED ORDER — LORAZEPAM 2 MG/ML IJ SOLN: 2.0000 mg | INTRAMUSCULAR | Status: DC | PRN

## 2020-07-16 MED ORDER — SODIUM CHLORIDE 0.9 % IV SOLN
100.0000 mg | INTRAVENOUS | Status: AC
  Administered 2020-07-16: 100 mg via INTRAVENOUS
  Filled 2020-07-16: qty 10

## 2020-07-16 MED ORDER — SODIUM CHLORIDE 0.9 % IV SOLN
100.0000 mg | Freq: Once | INTRAVENOUS | Status: DC
  Filled 2020-07-16: qty 10

## 2020-07-16 MED ORDER — POLYETHYLENE GLYCOL 3350 17 G PO PACK
17.0000 g | PACK | Freq: Two times a day (BID) | ORAL | Status: DC
  Administered 2020-07-17: 17 g via ORAL
  Filled 2020-07-16 (×3): qty 1

## 2020-07-16 NOTE — Progress Notes (Signed)
   Subjective: Examined patient at bedside.  Very somnolent on exam. Unable to answer questions.   Objective:  Vital signs in last 24 hours: Vitals:   07/15/20 2200 07/15/20 2215 07/15/20 2230 07/16/20 0331  BP: (!) 140/82 (!) 134/74 125/77 128/76  Pulse: 77 76 79 60  Resp: (!) 26 22 20 16   Temp:    98.8 F (37.1 C)  TempSrc:    Axillary  SpO2: 96% 96% 96% 99%   Physical Exam: General: Middle-aged male, appears well-developed and well-nourished, sleeping comfortably in bed, NAD. HENT: Half-centimeter laceration on left cheek. CV: Regular rate and normal rhythm. No m/r/g Pulm: Limited due to patient sleeping. Lungs clear to auscultation b/l. No adventitious sounds noted. Abdomen: Soft, non-distended, non-tender. Normoactive bowel sounds in all quadrants. Neuro: Somnolent on exam, unable to answer questions or follow commands.   Assessment/Plan:  Active Problems:   Seizure [facility])  Patient is a 59 year old with history of epilepsy (on tegretol and keppra), TBI w/ residual mild spastic left hemiparesis and mild cognitive impairment, and prostate cancer s/p brachytherapy who presented with a breakthrough seizure of unclear etiology.  Breakthrough Seizure: Unclear etiology. Patient has history of epilepsy since childhood and is on keppra and tegretol at home, with reported adherence. Last known seizure was in 2015 2/2 nonadherence. Events prior to current seizure are unknown. Tegretol levels within therapeutic range, keppra levels still pending.  Unlikely 2/2 infection as no fever, leukocytosis, or symptomology. No electrolyte abnormalities, negative UDS, negative alcohol level. Negative CXR and CT head wo contrast. UA not consistent with UTI. EEG showing frontally predominant epileptiform discharges with wide field, with suggestion of a left frontal focus. -Awaiting SLP evaluation prior to starting PO keppra 1500mg  BID and PO tegretol 400mg  BID -Continue with IV keppra 1500mg  BID until  SLP eval -Given another dose of IV vimpat 100mg  until SLP eval -If patient fails SLP eval, consider NG tube placement to administer home PO antiepileptic meds -Very mild rhabdo, CK 779 -Epileptology following, appreciate recs -Frequent neurochecks -Seizure precautions -Ativan 1-2mg  IV prn for seizures -Consider MRI brain to rule out mets  Lactic Acidosis: Lactic acid of 5.4, likely 2/2 seizure activity. Received 2L LR bolus in ED. -On maintenance fluids (LR 100cc/hr) -Repeat lactic acid 1.6  Prostate Cancer s/p brachytherapy: Urologist - Dr. Lovena Neighbours; Oncologist - Dr. Tammi Klippel.  -Awaiting SLP eval before resuming home flomax and ditropan. -Urine output 738mL overnight   Prior to Admission Living Arrangement: Home Anticipated Discharge Location: TBD Barriers to Discharge: pending further medical workup Dispo: pending further medical workup  Virl Axe, MD 07/16/2020, 7:46 AM Pager: [phone] After 5pm on weekdays and 1pm on weekends: On Call pager [phone]

## 2020-07-16 NOTE — Progress Notes (Signed)
Stat EEG completed, results pending.  Notified Dr Leonel Ramsay.

## 2020-07-16 NOTE — Progress Notes (Signed)
Examined patient in the afternoon. Was more awake on exam but still falling asleep at times during interview when I talk to patient's sister.   Sister reports that patient has been having severe intermittent abdominal cramps, about 5 episodes today including one when the SLP came to evaluate. Patient reports that he is not having any abdominal pain right now, but does state that when the abdominal cramps come on they are rough. On exam, patient's abdomen is soft, non-distended, non-tender. Negative McBurney's and Rovsings. As per nurse, Chrys Racer, patient did have a bowel movement overnight.   Patient does have a history of chronic constipation and has been seeing [facility]. Patient has had radiation therapy to the prostate. As per [facility] note, patient was placed on Miralax twice daily and lubiprostone prn. I am thinking that his abdominal cramps/pain may be 2/2 chronic constipation. Will resume home miralax and lubiprostone Will order KUB to check for stool burden, as bowel movement overnight may have been from overflow incontinence.    As per [facility], patient has history of colon polyps and is due for surveillance colonoscopy in September 2021. If abdominal issues are from chronic constipation and patient fails to respond to medications, can consider having colonoscopy earlier, although may not be needed at this time.   -Ordered miralax PO BID -Ordered lubiprostone 37mcg prn (for severe constipation) -Ordered KUB to assess stool burden

## 2020-07-16 NOTE — ED Notes (Signed)
Report given to rn on 4n

## 2020-07-16 NOTE — Progress Notes (Signed)
Received patient from ED responding to his name, confused, trying to get out of the bed, and pulling at the tubes. Soft restraints applied for safety.  Will continue to monitor.

## 2020-07-16 NOTE — Consult Note (Signed)
Neurology Consultation Reason for Consult: Seizures Referring Physician: Hillard Danker  CC: Seizures  History is obtained from: Chart review  HPI: Patrick Torres is a 59 y.o. male with history of TBI and seizures who presents with recurrent seizures with continued confusion.  He takes Keppra and Tegretol at baseline, unclear if he has missed any doses, but this is a Tegretol level is therapeutic.  He had a seizure around 5 PM, and was transported into the emergency department where he was confused.  He would talk some, spell his name, but was still clearly confused.  He then had a second seizure and was more confused after that.  Due to agitation following the second seizure, he has received a total of 6 mg of Ativan as well as Geodon and is currently sedated but still slightly restless.     ROS:  Unable to obtain due to altered mental status.   Past Medical History:  Diagnosis Date  . Seizures ([place])   . Seizures ([place])   . TBI (traumatic brain injury) ([place])      Family history: Unable to obtain due to altered mental status.   Social History: Unable to obtain due to altered mental status.  Exam: Current vital signs: BP 125/77   Pulse 79   Temp 98.3 F (36.8 C) (Oral)   Resp 20   SpO2 96%  Vital signs in last 24 hours: Temp:  [98.3 F (36.8 C)] 98.3 F (36.8 C) (07/25 1921) Pulse Rate:  [70-84] 79 (07/25 2230) Resp:  [12-26] 20 (07/25 2230) BP: (125-147)/(74-87) 125/77 (07/25 2230) SpO2:  [96 %-99 %] 96 % (07/25 2230)   Physical Exam  Constitutional: Appears well-developed and well-nourished.  Psych: Has some psychomotor agitation Eyes: No scleral injection HENT: No OP obstrucion MSK: no joint deformities.  Cardiovascular: Normal rate and regular rhythm.  Respiratory: Effort normal, non-labored breathing GI: Soft.  No distension. There is no tenderness.  Skin: WDI  Neuro: Mental Status: Patient is obtunded, but does arouse with significant noxious stimulation,  when I ask if my pinch on his upper arm hurts, he responds "yes" and when I ask where I am pinching, he responds "my arm."  He does not answer any other questions or follow any commands. Cranial Nerves: II: Does not blink to threat. Pupils are equal, round, and reactive to light.   III,IV, VI: Eyes are midline, he does not clearly fixate or track V: VII: Blinks to eyelid stimulation bilaterally Motor: He moves all extremities purposefully and localizes bilaterally  sensory: As above  Cerebellar: Does not perform   I have reviewed labs in epic and the results pertinent to this consultation are: Tegretol 6.9 Lactate 5.4 BMP-unremarkable  I have reviewed the images obtained: CT head-no acute findings  Impression: 59 year old male with a history of seizures with recurrent seizures today.  Unclear reason for breakthrough.  With slow return to baseline, I do think an urgent EEG would be prudent.  I have called the EEG tech to perform this.  Recommendations: 1) stat EEG 2) increase Keppra to 1.5 g twice daily 3) continue Tegretol-XR 400 mg twice daily 4) I will give a single dose of Vimpat given that he missed his evening dose of Tegretol. 5) UA   Roland Rack, MD [facility] [phone]  If 7pm- 7am, please page neurology on call as listed in [place].

## 2020-07-16 NOTE — Procedures (Signed)
History: 59 year old male with breakthrough seizures, persistent confusion  Sedation: Ativan 6 mg  Technique: This is a 21 channel routine scalp EEG performed at the bedside with bipolar and monopolar montages arranged in accordance to the international 10/20 system of electrode placement. One channel was dedicated to EKG recording.    Background: There is a posterior dominant rhythm of 7 to 8 Hz which is moderately well sustained.  In addition there are occasional epileptiform discharges with  polyspike +/-after going slow morphology which are which are seen bilaterally, but symptoms have a left frontal predominance.  Photic stimulation: Physiologic driving is now performed  EEG Abnormalities: 1) occasional frontally predominant epileptiform discharges with wide field, with suggestion of a left frontal focus   Clinical Interpretation: This EEG recorded evidence of potential area of epileptogenicity in the frontal region, poorly localized but likely in the left. There was no seizure recorded on this study. Please note that lack of epileptiform activity on EEG does not preclude the possibility of epilepsy.   Roland Rack, MD [facility] [phone]  If 7pm- 7am, please page neurology on call as listed in [place].

## 2020-07-16 NOTE — Progress Notes (Addendum)
Wrist restraints untied to trial pt per MD request. Will monitor.   1830: Pt calm, A&Ox4, following commands, and demonstrates ability to use call bell. Posey belt removed. Discontinuation measures explained to pt, who is agreeable to criteria explained. Criteria to replace restraints also explained to pt.   Justice Rocher, RN

## 2020-07-16 NOTE — Progress Notes (Addendum)
Subjective: No further seizures overnight. This morning patient is able to wake up and answer simple questions but still intermittently confused. Also reporting intermittent left sided abdominal pain. Patient's sister and niece at bedside.   ROS: negative except above  Examination  Vital signs in last 24 hours: Temp:  [97.8 F (36.6 C)-98.8 F (37.1 C)] 98.2 F (36.8 C) (07/26 1259) Pulse Rate:  [60-84] 63 (07/26 1259) Resp:  [12-26] 20 (07/26 1259) BP: (122-159)/(74-87) 122/80 (07/26 1259) SpO2:  [96 %-100 %] 96 % (07/26 1259)  General: lying in bed, NAD, left cheek abrasion CVS: pulse-normal rate and rhythm RS: breathing comfortably, CTAB Extremities: normal, warm   Neuro: limit as patient unable/declined to cooperate MS: drowsy, opens eyes to repeated verbal and tactile stimuli, oriented to name, place and person, not to time, dysarthric speech CN: face appears symmetric, refused to open eyes, stick out tongue Motor: 4/5 strength in all 4 extremities  Basic Metabolic Panel: Recent Labs  Lab 07/15/20 2103  NA 138  K 3.8  CL 106  CO2 19*  GLUCOSE 106*  BUN 7  CREATININE 1.20  CALCIUM 9.3    CBC: Recent Labs  Lab 07/15/20 2103  WBC 6.4  NEUTROABS 4.7  HGB 14.3  HCT 43.1  MCV 90.7  PLT 207     Coagulation Studies: No results for input(s): LABPROT, INR in the last 72 hours.  Imaging CT head wo contrast 07/15/2020: Chronic atrophic and ischemic changes  ASSESSMENT AND PLAN:  59 year old male with a history of TBI and seizures presented with recurrent breakthrough seizures   Breakthrough seizure Post ictal encephalopathy Abdominal pain Epilepsy TBI Elevated CK Hypoproteinemia with hypoalbuminemia Transaminitis Hyperbilirubinemia Lactic acidosis ( resolved) Prostate cancer - CBZ level WNL, keppra level pending. No leucocytosis, no electrolyte abnormalities. Patient's sister did state he hasnt been sleeping well for over a week because of having to get  up for urination repeatedly. Denies any recent infections. - Encephalopathy likely secondary to post ictal state, AEDs  Recommendations - Patient unable to take PO meds at this time. Therefore will order another dose of IV vimpat 100mg  instead of tegretol. If patient passes swallow eval, please resume home dose of tegretol 400mg  BID - As no clear etiology found for breakthrough seizure, will increase keppra from 1000mg  BID to 1500mg  BID - continue seizure precautions - PRN IV ativan for GTC> 2 mins or focal seizure > 5 mins - Management of rest of comorbidities per primary team - Discussed plan in detail with sister and niece at bedside. Sister requested to call her at [phone] or patient's niece at  [phone] for any updates  I have spent a total of  40  minutes with the patient reviewing hospital notes,  test results, labs and examining the patient as well as establishing an assessment and plan that was discussed personally with the patient, his family and primary team.  > 50% of time was spent in direct patient care.   Zeb Comfort Epilepsy [facility] For questions after 5pm please refer to AMION to reach the Neurologist on call

## 2020-07-16 NOTE — Progress Notes (Signed)
Sister called stating how upset she is that no one had called with an update about her brother's condition from the ED after waiting for hours in the parking lot and they would not let her in the ED.  Instructed the status of her brother after confirming  patient's sister.  Sister continued to state how disappointed she has been in the past with her brother's care and wanted to the best for him. Encouraged sister to speak with the Dr.'s herself today and set up the plan of care.

## 2020-07-16 NOTE — Progress Notes (Signed)
Patient now becoming more alert to his name and where he is.  Speaking more clearly.  Patient does better in a calm, quite environment.Marland Kitchen

## 2020-07-16 NOTE — Progress Notes (Signed)
Pt very lethargic this am. Arousable to touch and falls back asleep quickly. Initially unable to follow commands or answer orientation questions. However, about 0830, the pt is more alert and orientedx4. Will continue to monitor closely.   Pt currently NPO, and all medications, including anti-seizure meds are oral. Dr. Allyson Sabal was paged to request to either consult SLP and/or change medications to IV. SLP has been consulted.  Justice Rocher, RN
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
## 2020-07-17 DIAGNOSIS — R569 Unspecified convulsions: Secondary | ICD-10-CM | POA: Diagnosis not present

## 2020-07-17 LAB — HIV ANTIBODY (ROUTINE TESTING W REFLEX): HIV Screen 4th Generation wRfx: NONREACTIVE

## 2020-07-17 LAB — CK: Total CK: 710 U/L — ABNORMAL HIGH (ref 49–397)

## 2020-07-17 MED ORDER — OXYBUTYNIN CHLORIDE ER 10 MG PO TB24
10.0000 mg | ORAL_TABLET | Freq: Every day | ORAL | Status: DC
  Administered 2020-07-17: 10 mg via ORAL
  Filled 2020-07-17 (×2): qty 1

## 2020-07-17 NOTE — Progress Notes (Signed)
° °  Subjective: Patient reports that he is doing okay today. He has been having bowel movements and his abdominal cramps have resolved.   Discussed that all the work up for his seizure was unremarkable. Discussed that we have increased his seizure medications to help prevent this.   His sister was at bedside, she was requesting a life alert that he could have at home. We discussed that he should follow up with his primary care provider to discuss this. Also discussed that we will have PT evaluate him before he leaves. She was requesting a safe discharge plan, expressed concerns that he is still unstable on his feet. She reported that they did not want to go to a SNF if that is what PT recommended. Stated that they may want home health. Discussed that we will have PT evaluate and go based on their recommendations.   Objective:  Vital signs in last 24 hours: Vitals:   07/16/20 2022 07/16/20 2345 07/17/20 0356 07/17/20 0755  BP: (!) 134/85 124/81 111/73 (!) 129/70  Pulse: 74 76  65  Resp: 20 18 20 20   Temp: 98.4 F (36.9 C) 98.5 F (36.9 C) 98.5 F (36.9 C) 98.4 F (36.9 C)  TempSrc: Oral Oral Oral Oral  SpO2: 97% 96% 97% 99%   Physical Exam: General: Middle-aged male, sitting up in chair, NAD. HENT: Half-centimeter laceration on left cheek, healing. CV: Normal rate and regular rhythm. No m/r/g Pulm: CTABL, no adventitious sounds noted. Abdomen: Soft, non-tender, non-distended. Normoactive bowel sounds. Neuro: Alert and awake on exam.   Assessment/Plan:  Active Problems:   Seizure [facility])  Patient is a 59 year old with history of epilepsy (on tegretol and keppra), TBI w/ residual mild spastic left hemiparesis and mild cognitive impairment, and prostate cancer s/p brachytherapy who presented with a breakthrough seizure of unclear etiology.  Breakthrough Seizure: Unclear etiology. Patient has history of epilepsy since childhood and is on keppra and tegretol at home, with reported  adherence. Last known seizure was in 2015 2/2 nonadherence. Events prior to current seizure are unknown. Tegretol levels within therapeutic range, keppra levels still pending. Unlikely 2/2 infection as no fever, leukocytosis, or symptomology. No electrolyte abnormalities, negative UDS, negative alcohol level. Negative CXR and CT head wo contrast. UA not consistent with UTI. EEG showing left frontal epileptiform discharges, but no evidence of seizures. -Passed SLP eval -Continue PO tegretol 400mg  BID -Continue PO keppra 1500mg  BID -CK 779 --> 710, very mild rhabdo, no need for further workup -Epileptology following, appreciate recs -Continue seizure precautions -Ativan 1-2mg  IV prn for seizures -Will discharge with PO tegretol 400mg  BID and PO keppra 1500mg  BID -F/u with Neurology (Dr. Leonie Man at [facility]) in 8-12 weeks -PT evaluated --> recommend home health PT along with 24-hr supervision/assistance at home  Lactic Acidosis: Lactic acid of 5.4, likely 2/2 seizure activity. Received 2L LR bolus in ED. Repeat lactic acid 1.6. No need for further f/u.  Prostate Cancer s/p brachytherapy: Urologist - Dr. Lovena Neighbours; Oncologist - Dr. Tammi Klippel. Stable. -Resumed home flomax and ditropan.   Prior to Admission Living Arrangement: Home Anticipated Discharge Location: Sister's home with home health PT Barriers to Discharge: pending safe discharge planning Dispo: Anticipated discharge tomorrow   Virl Axe, MD 07/17/2020, 2:58 PM Pager: [phone] After 5pm on weekdays and 1pm on weekends: On Call pager [phone]

## 2020-07-17 NOTE — Progress Notes (Signed)
Subjective: NAEO. No further seizures. Patient sitting on recliner with sister at bedside.   ROS: negative except above  Examination  Vital signs in last 24 hours: Temp:  [98 F (36.7 C)-98.5 F (36.9 C)] 98.4 F (36.9 C) (07/27 0755) Pulse Rate:  [63-78] 65 (07/27 0755) Resp:  [18-20] 20 (07/27 0755) BP: (111-134)/(70-85) 129/70 (07/27 0755) SpO2:  [96 %-99 %] 99 % (07/27 0755)  General: sitting in recliner, NAD CVS: pulse-normal rate and rhythm RS: breathing comfortably, CTAB Extremities: normal, warm   Neuro: MS: Alert, oriented, follows commands CN: pupils equal and reactive,  EOMI, face symmetric, tongue midline, normal sensation over face Motor: antigravity in all 4 extremities  Basic Metabolic Panel: Recent Labs  Lab 07/15/20 2103  NA 138  K 3.8  CL 106  CO2 19*  GLUCOSE 106*  BUN 7  CREATININE 1.20  CALCIUM 9.3    CBC: Recent Labs  Lab 07/15/20 2103  WBC 6.4  NEUTROABS 4.7  HGB 14.3  HCT 43.1  MCV 90.7  PLT 207     Coagulation Studies: No results for input(s): LABPROT, INR in the last 72 hours.  Imaging NO imaging overnight   ASSESSMENT AND PLAN: 59 year old male with a history of TBI and seizures presented with recurrent breakthrough seizures   Breakthrough seizure Abdominal pain Epilepsy TBI - CBZ level WNL, keppra level pending. No leucocytosis, no electrolyte abnormalities. Patient's sister did state he hasnt been sleeping well for over a week because of having to get up for urination repeatedly. Denies any recent infections. - Encephalopathy likely secondary to post ictal state, AEDs  Recommendations - As no clear etiology found for breakthrough seizure, increased keppra from 1000mg  BID to 1500mg  BID - Continue oxcarb at 400mg  BID - continue seizure precautions - PRN IV ativan for GTC> 2 mins or focal seizure > 5 mins - Management of rest of comorbidities per primary team - Discussed plan in detail with sister at bedside - F/u  with Neurology ( patient states he follows with Dr Leonie Man at [facility]) in 8-12 weeks   I have spent a total of 25  minuteswith the patient reviewing hospitalnotes,  test results, labs and examining the patient as well as establishing an assessment and plan that was discussed personally with the patient, his family and primary team.>50% of time was spent in direct patient care.  Zeb Comfort Epilepsy [facility] For questions after 5pm please refer to AMION to reach the Neurologist on call

## 2020-07-17 NOTE — TOC Transition Note (Signed)
Transition of Care (TOC) - CM/SW Discharge Note Marvetta Gibbons RN,BSN Transitions of Care Unit 4NP (non trauma) - RN Case Manager See Treatment Team for direct Phone #   Patient Details  Name: Patrick Torres MRN: [id number] Date of Birth: [birth year]  Transition of Care [facility]) CM/SW Contact:  Dawayne Patricia, RN Phone Number: 07/17/2020, 5:01 PM   Clinical Narrative:    Pt for possible d/c today, order has been placed for HHPT, CM spoke with pt at bedside to discuss [facility] needs- list provided for [facility] choice Per CMS guidelines from medicare.gov website with star ratings (copy placed in shadow chart)- per pt he has used HH in past- does not remember name- and has no preference for agency at this time- defers to CM to select an agency that can provide needed services and can work with his insurance. Pt reports that he has needed DME and his sister will provide transport home.  Address, phone # and PCP all confirmed with pt in epic.   Call made to [facility] with [facility]- they are at capacity with services and unable to accept at this time Call made to [facility] with [facility]- who is able to accept- services to start within 48 hr post discharge.    Final next level of care: [place] Barriers to Discharge: No Barriers Identified   Patient Goals and CMS Choice Patient states their goals for this hospitalization and ongoing recovery are:: "to get back doing my regular routine" CMS Medicare.gov Compare Post Acute Care list provided to:: Patient Choice offered to / list presented to : Patient  Discharge Placement               Home with [facility]        Discharge Plan and Services   Discharge Planning Services: CM Consult Post Acute Care Choice: Home Health          DME Arranged: N/A DME Agency: NA       HH Arranged: PT HH Agency: [facility] Date [place]: 07/17/20 Time [place]: 1701 Representative spoke with at [place]:  Cassie  Social Determinants of Health ([place]) Interventions     Readmission Risk Interventions Readmission Risk Prevention Plan 07/17/2020  Post Dischage Appt Complete  Medication Screening Complete  Transportation Screening Complete

## 2020-07-17 NOTE — Evaluation (Signed)
Physical Therapy Evaluation Patient Details Name: Patrick Torres MRN: [id number] DOB: 09-Nov-1961 Today's Date: 07/17/2020   History of Present Illness  Pt is a 59 y.o. male admitted 07/15/20 following a seizure. Head CT with chronic atrophic and ischemic changes; limited exam due to patient motion artifact. EEG 7/26 recorded evidence of potential area of epileptogenicity in L frontal region. 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    Clinical Impression  Pt presents with an overall decrease in functional mobility secondary to above. PTA, pt mod indep with SPC/rollator, lives alone, uses bus for trasnportaition. Today, pt able to perform transfer and gait training with RW, progressing to min guard for balance. Demonstrates poor safety awareness, decreased attention and impaired problem solving. Sister reports cognition near baseline, although she still notes a delay in speech. Discussed recommendation for initial 24/7 support upon return home as pt at increased risk for falls, family in agreement. Recommend HHPT services to maximize functional mobility and independence. Pt preparing for d/c today. If to remain admitted, will continue to follow acutely.  HR 64 Post-ambulation BP 132/80    Follow Up Recommendations Home health PT;Supervision/Assistance - 24 hour    Equipment Recommendations  None recommended by PT    Recommendations for Other Services       Precautions / Restrictions Precautions Precautions: Fall Precaution Comments: H/o TBI Restrictions Weight Bearing Restrictions: No      Mobility  Bed Mobility               General bed mobility comments: Received sitting in recliner  Transfers Overall transfer level: Needs assistance Equipment used: Rolling walker (2 wheeled) Transfers: Sit to/from Stand Sit to Stand: Min assist;Min guard         General transfer comment: Pt  initially rushing due to needing to use bathroom, minA to stand and maintain balance. Subsequent trials with min guard from recliner  Ambulation/Gait Ambulation/Gait assistance: Mod assist;Min guard Gait Distance (Feet): 150 Feet Assistive device: Rolling walker (2 wheeled) Gait Pattern/deviations: Step-through pattern;Decreased stride length;Trunk flexed;Staggering left;Staggering right Gait velocity: Decreased   General Gait Details: Initial walk to bathroom, rushed as pt with urgency for BM, 1x modA to prevent posterior LOB, poor navigation of RW and poor safety awareness. After this, additional gait trial with RW; improved stability, intermittent cues for safety with RW, min guard for balance  Stairs            Wheelchair Mobility    Modified Rankin (Stroke Patients Only)       Balance Overall balance assessment: Needs assistance   Sitting balance-Leahy Scale: Fair       Standing balance-Leahy Scale: Fair Standing balance comment: Can static stand withotu UE support, stability much improved wtih UE support                             Pertinent Vitals/Pain Pain Assessment: Faces Faces Pain Scale: No hurt    Home Living Family/patient expects to be discharged to:: Private residence Living Arrangements: Alone Available Help at Discharge: Family;Available 24 hours/day Type of Home: House Home Access: Stairs to enter Entrance Stairs-Rails: Right Entrance Stairs-Number of Steps: 2 Home Layout: One level Home Equipment: Walker - 4 wheels;Cane - single point Additional Comments: Family can provide initial 24/7 support    Prior Function Level of Independence: Independent with assistive device(s)         Comments:  Lives alone; mod indep with rollator for community ambulation and [facility] for household distances; uses bus for transportation; exercises at [employer] [facility] [employer] 2x/wk; on disability from work     Hand Dominance        Extremity/Trunk Assessment   Upper  Extremity Assessment Upper Extremity Assessment: Overall WFL for tasks assessed    Lower Extremity Assessment Lower Extremity Assessment: Overall WFL for tasks assessed       Communication      Cognition Arousal/Alertness: Awake/alert Behavior During Therapy: WFL for tasks assessed/performed Overall Cognitive Status: History of cognitive impairments - at baseline Area of Impairment: Attention;Memory;Following commands;Safety/judgement;Awareness;Problem solving                   Current Attention Level: Sustained;Selective Memory: Decreased short-term memory Following Commands: Follows one step commands inconsistently Safety/Judgement: Decreased awareness of deficits;Decreased awareness of safety Awareness: Emergent Problem Solving: Slow processing;Requires verbal cues General Comments: H/o TBI. Pt reports cognition back to "normal"; sister present and reports improvements although she still notices a delay in his speech. Monday, August 2021 - able to correct with cues      General Comments General comments (skin integrity, edema, etc.): Sister present and supportive. Pt preparing to d/c today - discussed safety recommendations, fall risk reduction, supervision/assistance recommendations, DME needs    Exercises     Assessment/Plan    PT Assessment Patient needs continued PT services  PT Problem List Decreased activity tolerance;Decreased balance;Decreased mobility;Decreased cognition;Decreased knowledge of use of DME;Decreased safety awareness       PT Treatment Interventions DME instruction;Gait training;Stair training;Functional mobility training;Therapeutic activities;Balance training;Therapeutic exercise;Neuromuscular re-education;Cognitive remediation;Patient/family education    PT Goals (Current goals can be found in the Care Plan section)  Acute Rehab PT Goals Patient Stated Goal: Home today PT Goal Formulation: With patient/family Time For Goal Achievement:  07/31/20 Potential to Achieve Goals: Good    Frequency Min 3X/week   Barriers to discharge        Co-evaluation               AM-PAC PT "6 Clicks" Mobility  Outcome Measure Help needed turning from your back to your side while in a flat bed without using bedrails?: None Help needed moving from lying on your back to sitting on the side of a flat bed without using bedrails?: None Help needed moving to and from a bed to a chair (including a wheelchair)?: A Little Help needed standing up from a chair using your arms (e.g., wheelchair or bedside chair)?: A Little Help needed to walk in hospital room?: A Little Help needed climbing 3-5 steps with a railing? : A Little 6 Click Score: 20    End of Session Equipment Utilized During Treatment: Gait belt Activity Tolerance: Patient tolerated treatment well Patient left: in chair;with call bell/phone within reach;with family/visitor present Nurse Communication: Mobility status PT Visit Diagnosis: Other abnormalities of gait and mobility (R26.89)    Time: 6712-4580 PT Time Calculation (min) (ACUTE ONLY): 25 min   Charges:   PT Evaluation $PT Eval Moderate Complexity: 1 Mod PT Treatments $Gait Training: 8-22 mins      Mabeline Caras, PT, DPT Acute Rehabilitation Services  Pager [phone] Office [place] 07/17/2020, 12:04 PM

## 2020-07-18 LAB — LEVETIRACETAM LEVEL: Levetiracetam Lvl: 36.4 ug/mL (ref 10.0–40.0)

## 2020-07-18 MED ORDER — OXYBUTYNIN CHLORIDE ER 10 MG PO TB24: 10.0000 mg | ORAL_TABLET | Freq: Every day | ORAL | 0 refills | Status: DC

## 2020-07-18 MED ORDER — LEVETIRACETAM 750 MG PO TABS: 1500.0000 mg | ORAL_TABLET | Freq: Two times a day (BID) | ORAL | 2 refills | Status: DC

## 2020-07-18 MED ORDER — LUBIPROSTONE 24 MCG PO CAPS: 24.0000 ug | ORAL_CAPSULE | Freq: Every day | ORAL | 0 refills | Status: DC | PRN

## 2020-07-18 MED FILL — levETIRAcetam 750 MG TABS: 750 | 30 days supply | Qty: 120 | Fill #0

## 2020-07-18 NOTE — Progress Notes (Signed)
Pt with discharge orders. Discharge paperwork reviewed with pt at bedside and on the phone with the sister. IV removed. Pt provided extra supplies for condom catheter and disposable bed pads per MD. TOC med brought to room and with pt at time of d/c. Pt escorted out via wheelchair with all belongings.

## 2020-07-18 NOTE — Progress Notes (Signed)
Subjective: Examined patient at bedside. Patient reports doing well overnight.  Sister reports that she set up an appointment with the urologist for this Friday because of frequent urination. Sister is requesting a condom catheter at home for this and wants the home health nurse to bring in this equipment. Advised to ask nurse if they can provide him with some supplies. Discussed that we can try to see what we can help with him.    Sister states that the problem is that he is awake all night from urinating and having bowel movements.  She is worried about him having constant urination and bowel movements during the day. She reports that they would not want to go to SNF because of a prior bad experience. She reported that they would be able to provide him with care but just wanted the care. She is just wanting supplies for her to use at home.  Asked nurse to give patient some extra supplies (condom catheter, bed sheets/pads, diapers) at discharge.  Objective:  Vital signs in last 24 hours: Vitals:   07/17/20 2028 07/17/20 2318 07/18/20 0558 07/18/20 0737  BP: (!) 138/90 (!) 139/91 (!) 134/78 124/79  Pulse: 61 69 52 60  Resp: 20 20 14 13   Temp: 97.6 F (36.4 C) 98.2 F (36.8 C) 98.1 F (36.7 C) 97.8 F (36.6 C)  TempSrc: Oral Oral Oral Oral  SpO2: 99% 100% 99% 98%   Physical Exam: General: Middle-aged male, sitting up in chair, NAD. HENT: Half-centimeter laceration on left cheek, healing. CV: Normal rate and regular rhythm. No m/r/g Pulm: CTABL, no adventitious sounds noted. Abdomen: Soft, non-tender, non-distended. Normoactive bowel sounds. Neuro: Alert and awake on exam. Answering questions appropriately.  Assessment/Plan:  Active Problems:   Seizure [facility])  Patient is a 59 year old with history of epilepsy (on tegretol and keppra), TBI w/ residual mild spastic left hemiparesis and mild cognitive impairment, and prostate cancer s/p brachytherapy who presented with a  breakthrough seizure of unclear etiology.  Breakthrough Seizure: Unclear etiology. Patient has history of epilepsy since childhood and is on keppra and tegretol at home, with reported adherence. Last known seizure was in 2015 2/2 nonadherence. Events prior to current seizure are unknown. Tegretol levels within therapeutic range, keppra levels still pending. Unlikely 2/2 infection as no fever, leukocytosis, or symptomology. No electrolyte abnormalities, negative UDS, negative alcohol level. Negative CXR and CT head wo contrast. UA not consistent with UTI. EEG showing left frontal epileptiform discharges, but no evidence of seizures. -As per neuro, okay to discharge with PO tegretol 400mg  BID and PO keppra 1500mg  BID (will send with 3 month supply -- 1 month via TOC and 2 months from local pharmacy) -F/u with Neurology (Dr. Leonie Man at [facility]) in 8-12 weeks -f/u PCP on August 18th, 2021 -PT evaluated --> recommend home health PT along with 24-hr supervision/assistance at home  Increased urinary frequency: -sending home with extra supplies to manage temporarily -f/u urologist as outpatient on Friday  Lactic Acidosis: Lactic acid of 5.4, likely 2/2 seizure activity. Received 2L LR bolus in ED. Repeat lactic acid 1.6. No need for further f/u.  Prostate Cancer s/p brachytherapy: Urologist - Dr. Lovena Neighbours; Oncologist - Dr. Tammi Klippel. Stable. -Resumed home flomax and ditropan.  Prior to Admission Living Arrangement: Home Anticipated Discharge Location: Sister's home with home health PT/OT Barriers to Discharge: None Dispo: Anticipated discharge today.   Virl Axe, MD 07/18/2020, 11:25 AM Pager: [phone] After 5pm on weekdays and 1pm on weekends: On Call pager  [phone]  

## 2020-07-18 NOTE — Discharge Summary (Signed)
Name: Patrick Torres MRN: [id number] DOB: [birth year] 59 y.o. PCP: Sonia Side., FNP  Date of Admission: 07/15/2020  7:12 PM Date of Discharge:  07/18/2020 Attending Physician: Velna Ochs, MD  Discharge Diagnosis: 1. Breakthrough seizure - unclear etiology  Discharge Medications: Allergies as of 07/18/2020   No Known Allergies     Medication List    TAKE these medications   carbamazepine 400 MG 12 hr tablet Commonly known as: TEGRETOL XR Take 400 mg by mouth 2 (two) times daily.   levETIRAcetam 750 MG tablet Commonly known as: KEPPRA Take 2 tablets (1,500 mg total) by mouth 2 (two) times daily. What changed:   medication strength  how much to take   lubiprostone 24 MCG capsule Commonly known as: AMITIZA Take 1 capsule (24 mcg total) by mouth daily as needed for constipation.   oxybutynin 10 MG 24 hr tablet Commonly known as: DITROPAN-XL Take 1 tablet (10 mg total) by mouth at bedtime.   tamsulosin 0.4 MG Caps capsule Commonly known as: FLOMAX Take 0.4 mg by mouth every 12 (twelve) hours.       Disposition and follow-up:   Patrick Torres was discharged from [facility] in Stable condition.  At the hospital follow up visit please address:  1.  Breakthrough seizure: Unclear etiology. Discharged with home Tegretol (461m BID) and increased Keppra (15052mBID). [facility] in October 2021.  2.  Labs / imaging needed at time of follow-up: CBC, BMP  3.  Pending labs/ test needing follow-up: None  Follow-up Appointments: -f/u with neurologist, Dr. SeLeonie Manin October 2021 -f/u with PCP, Dr. FrDustin Folkson August 08, 2020 -f/u with urologist on July 20, 2020  [facility].   Specialty: [place]: HHPT arranged- they will call you post discharge to set up home visits Contact information: [phone]        SmSonia Side FNP Follow up in 1 week(s).   Specialty: Family Medicine Why: Please make an appointment to see Dr. SmTamala Juliann 1-2 weeks Contact information: [address] [phone] problem list: Breakthrough Seizure: Unclear etiology. Patient has history of epilepsy since childhood and is on keppra and tegretol at home, with reported adherence. Last known seizure was in 2015 2/2 nonadherence. Events prior to current seizure are unknown. Tegretol levels within therapeutic range, keppra levels still pending. Unlikely 2/2 infection as no fever, leukocytosis, or symptomology. No electrolyte abnormalities, negative UDS, negative alcohol level. Negative CXR and CT head wo contrast. UA not consistent with UTI. EEG showingleftfrontal epileptiform discharges, but no evidence of seizures. As per neuro, medically stable for discharge with PO tegretol 40062mID and PO keppra 1500m90mD. [facility] in 8-12 weeks. F/u PCP on August 18th, 2021. As per PT, discharged with home health PT/OT along with 24-hr supervision/assistance at home (will be provided by patient's sister).  Increased urinary frequency: Ongoing issue for patient as per sister. On home oxybutynin qhs. F/u with urologist as outpatient on July 20, 2020. Discharging home with extra supplies to manage temporarily.  Lactic Acidosis: Lactic acid of 5.4 on admission, likely 2/2 seizure activity. Received 2L LR bolus in ED.Repeat lactic acid 1.6. No need  for further f/u.  Prostate Cancer s/p brachytherapy: Stable. Urologist - Dr. Lovena Neighbours; Oncologist - Dr. Tammi Klippel. Discharged withhome flomax and ditropan.  Discharge Vitals:   BP (!) 100/64 (BP Location: Right Arm)   Pulse 59   Temp 98.2 F (36.8 C) (Oral)   Resp 18   SpO2 97%   Pertinent Labs, Studies, and Procedures:   CBC Latest Ref Rng & Units 07/15/2020  WBC 4.0 - 10.5 K/uL 6.4  Hemoglobin 13.0 - 17.0 g/dL 14.3  Hematocrit 39 - 52 % 43.1  Platelets 150 - 400 K/uL 207   CMP Latest Ref Rng & Units 07/16/2020 07/15/2020  Glucose 70 - 99 mg/dL - 106(H)  BUN 6 - 20 mg/dL - 7  Creatinine 0.61 - 1.24 mg/dL - 1.20  Sodium 135 - 145 mmol/L - 138  Potassium 3.5 - 5.1 mmol/L - 3.8  Chloride 98 - 111 mmol/L - 106  CO2 22 - 32 mmol/L - 19(L)  Calcium 8.9 - 10.3 mg/dL - 9.3  Total Protein 6.5 - 8.1 g/dL 6.3(L) -  Total Bilirubin 0.3 - 1.2 mg/dL 2.0(H) -  Alkaline Phos 38 - 126 U/L 43 -  AST 15 - 41 U/L 59(H) -  ALT 0 - 44 U/L 28 -   Hepatic Function Latest Ref Rng & Units 07/16/2020  Total Protein 6.5 - 8.1 g/dL 6.3(L)  Albumin 3.5 - 5.0 g/dL 3.3(L)  AST 15 - 41 U/L 59(H)  ALT 0 - 44 U/L 28  Alk Phosphatase 38 - 126 U/L 43  Total Bilirubin 0.3 - 1.2 mg/dL 2.0(H)  Bilirubin, Direct 0.0 - 0.2 mg/dL 1.0(H)   Urinalysis    Component Value Date/Time   COLORURINE YELLOW 07/16/2020 0540   APPEARANCEUR HAZY (A) 07/16/2020 0540   LABSPEC 1.014 07/16/2020 0540   PHURINE 7.0 07/16/2020 0540   GLUCOSEU NEGATIVE 07/16/2020 0540   HGBUR MODERATE (A) 07/16/2020 0540   BILIRUBINUR NEGATIVE 07/16/2020 0540   KETONESUR 5 (A) 07/16/2020 0540   PROTEINUR 30 (A) 07/16/2020 0540   NITRITE NEGATIVE 07/16/2020 0540   LEUKOCYTESUR TRACE (A) 07/16/2020 0540    Drugs of Abuse     Component Value Date/Time   LABOPIA NONE DETECTED 07/15/2020 2219   COCAINSCRNUR NONE DETECTED 07/15/2020 2219   LABBENZ NONE DETECTED 07/15/2020 2219   AMPHETMU NONE DETECTED 07/15/2020 2219   THCU NONE DETECTED 07/15/2020 2219   LABBARB NONE DETECTED 07/15/2020 2219   Lactic Acid, Venous    Component Value Date/Time   LATICACIDVEN 1.6 07/16/2020 0244   CK: 779 --> 710   Tegretol levels and keppra levels --> in therapeutic range  Ethanol levels <10 mg/dL    Discharge Instructions: Discharge Instructions    Call MD for:   difficulty breathing, headache or visual disturbances   Complete by: As directed    Call MD for:  extreme fatigue   Complete by: As directed    Call MD for:  hives   Complete by: As directed    Call MD for:  persistant dizziness or light-headedness   Complete by: As directed    Call MD for:  persistant nausea and vomiting   Complete by: As directed    Call MD for:  redness, tenderness, or signs of infection (pain, swelling, redness, odor or green/yellow discharge around incision site)   Complete by: As directed    Call MD for:  severe uncontrolled pain   Complete by: As directed    Call MD for:  temperature >100.4   Complete by:  As directed    Diet - low sodium heart healthy   Complete by: As directed    Discharge instructions   Complete by: As directed    Mr. Mcbane, it was a pleasure taking care of you during your time here. You came in for a seizure and we did an extensive workup but we were unable to figure out why you had the seizure.  We are sending you home on your same dose of Tegretol as you were taking before (461m twice a day). However, please note that we have increased your Keppra dose to 15049mtwice a day. I have prescribed your new dose of Keppra so you will have it with you before you leave the hospital.  Please make sure to follow up with your neurologist, [facility], in 8-12 weeks (October 2021) for further management and care.  Please make sure to follow up with your urologist this Friday (July 30th, 2021)  Please make sure to follow up with your primary care doctor, Dr. FrDustin Folkson August 18th, 2021.  Lastly, we are sending you home with home health services (physical therapy and occupational therapy).   We also provided some extra supplies to make the transition to home a little easier. Please follow up with your primary care doctor or your urologist for further help in this regard.   Increase activity slowly   Complete by:  As directed       Signed: JiVirl AxeMD 07/18/2020, 2:32 PM   Pager: [phone]

## 2020-07-18 NOTE — Progress Notes (Signed)
Physical Therapy Treatment Patient Details Name: Patrick Torres MRN: [id number] DOB: 1961/04/09 Today's Date: 07/18/2020    History of Present Illness Pt is a 59 y.o. male admitted 07/15/20 following a seizure. Head CT with chronic atrophic and ischemic changes; limited exam due to patient motion artifact. EEG 7/26 recorded evidence of potential area of epileptogenicity in L frontal region. 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    PT Comments    Pt tolerates treatment well with improved recognition of balance and gait deviations. PT reports he needs to utilize RW for stability when moving and needs to take his time as he may lose his balance as he did yesterday with PT. Pt will benefit from continued acute PT services to continue progressing upon gait and balance deficits and to restore independence in mobility. PT continues to recommend HHPT, no current DME needs.   Follow Up Recommendations  Home health PT;Supervision/Assistance - 24 hour     Equipment Recommendations  None recommended by PT    Recommendations for Other Services       Precautions / Restrictions Precautions Precautions: Fall Precaution Comments: H/o TBI Restrictions Weight Bearing Restrictions: No    Mobility  Bed Mobility               General bed mobility comments: pt received and left sitting in recliner  Transfers Overall transfer level: Needs assistance Equipment used: Rolling walker (2 wheeled) Transfers: Sit to/from Stand Sit to Stand: Supervision            Ambulation/Gait Ambulation/Gait assistance: Min guard (progressing to close supervision) Gait Distance (Feet): 250 Feet Assistive device: Rolling walker (2 wheeled) Gait Pattern/deviations: Step-through pattern Gait velocity: reduced Gait velocity interpretation: <1.8 ft/sec, indicate of risk for recurrent falls General Gait Details: pt with  slowed step through gait, no significant balance deviations noted with BUE support of RW   Stairs Stairs:  (pt declines attempts at stair negotiation)           Wheelchair Mobility    Modified Rankin (Stroke Patients Only)       Balance Overall balance assessment: Needs assistance Sitting-balance support: No upper extremity supported;Feet supported Sitting balance-Leahy Scale: Good Sitting balance - Comments: supervision at edge of recliner   Standing balance support: Bilateral upper extremity supported Standing balance-Leahy Scale: Poor Standing balance comment: reliant on UE support to maintain balance                            Cognition Arousal/Alertness: Awake/alert Behavior During Therapy: WFL for tasks assessed/performed Overall Cognitive Status: History of cognitive impairments - at baseline Area of Impairment: Problem solving                             Problem Solving: Slow processing        Exercises      General Comments General comments (skin integrity, edema, etc.): VSS on RA      Pertinent Vitals/Pain Pain Assessment: No/denies pain    Home Living                      Prior Function            PT Goals (current goals can now be found in the care plan section) Acute Rehab PT Goals Patient Stated Goal: Home today Progress towards  PT goals: Progressing toward goals    Frequency    Min 3X/week      PT Plan Current plan remains appropriate    Co-evaluation              AM-PAC PT "6 Clicks" Mobility   Outcome Measure  Help needed turning from your back to your side while in a flat bed without using bedrails?: None Help needed moving from lying on your back to sitting on the side of a flat bed without using bedrails?: None Help needed moving to and from a bed to a chair (including a wheelchair)?: None Help needed standing up from a chair using your arms (e.g., wheelchair or bedside chair)?:  None Help needed to walk in hospital room?: A Little Help needed climbing 3-5 steps with a railing? : A Little 6 Click Score: 22    End of Session   Activity Tolerance: Patient tolerated treatment well Patient left: in chair;with call bell/phone within reach;with chair alarm set Nurse Communication: Mobility status PT Visit Diagnosis: Other abnormalities of gait and mobility (R26.89)     Time: 0981-1914 PT Time Calculation (min) (ACUTE ONLY): 18 min  Charges:  $Gait Training: 8-22 mins                     Zenaida Niece, PT, DPT Acute Rehabilitation Pager: [phone]    Zenaida Niece 07/18/2020, 2:15 PM

## 2020-07-19 ENCOUNTER — Encounter: Payer: Self-pay | Admitting: Gastroenterology

## 2020-09-17 ENCOUNTER — Encounter: Payer: Self-pay | Admitting: Gastroenterology

## 2020-09-21 ENCOUNTER — Other Ambulatory Visit: Payer: Self-pay | Admitting: Neurology

## 2020-10-11 ENCOUNTER — Ambulatory Visit: Payer: Medicare Other | Admitting: Neurology

## 2020-10-11 ENCOUNTER — Encounter: Payer: Self-pay | Admitting: Neurology

## 2020-10-11 VITALS — BP 117/71 | HR 71 | Ht 68.0 in | Wt 213.4 lb

## 2020-10-11 DIAGNOSIS — R569 Unspecified convulsions: Secondary | ICD-10-CM

## 2020-10-11 DIAGNOSIS — S062X4S Diffuse traumatic brain injury with loss of consciousness of 6 hours to 24 hours, sequela: Secondary | ICD-10-CM

## 2020-10-11 MED ORDER — CARBAMAZEPINE ER 400 MG PO TB12: 400.0000 mg | ORAL_TABLET | Freq: Two times a day (BID) | ORAL | 1 refills | Status: DC

## 2020-10-11 MED ORDER — LEVETIRACETAM 750 MG PO TABS: 1500.0000 mg | ORAL_TABLET | Freq: Two times a day (BID) | ORAL | 2 refills | Status: DC
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
## 2020-10-11 NOTE — Patient Instructions (Signed)
I had a long discussion with the patient regarding his recent breakthrough seizures which appear to be without obvious triggers.  I agree with increasing the dose of Keppra to 1500 mg twice daily and continuing carbamazepine XR 400 mg twice daily the current dosage.  He was given refills for his medicines.  He was advised to avoid seizure provoking triggers like sleep deprivation, medication on compliance and use of stimulants.  He was advised to increase participation in cognitively challenging activities like solving crossword puzzles, playing bridge and sodoku for his mild cognitive impairment which appears stable.  He will return for follow-up in the future in 6 months with my nurse practitioner Janett Billow or call earlier if necessary.

## 2020-11-27 ENCOUNTER — Other Ambulatory Visit: Payer: Self-pay | Admitting: Urology

## 2021-01-01 ENCOUNTER — Other Ambulatory Visit (HOSPITAL_COMMUNITY)
Admission: RE | Admit: 2021-01-01 | Discharge: 2021-01-01 | Disposition: A | Discharge status: Home / Self Care | Payer: Medicare Other | Source: Ambulatory Visit | Attending: Urology | Admitting: Urology

## 2021-01-01 DIAGNOSIS — Z01812 Encounter for preprocedural laboratory examination: Secondary | ICD-10-CM | POA: Diagnosis present

## 2021-01-01 DIAGNOSIS — Z20822 Contact with and (suspected) exposure to covid-19: Secondary | ICD-10-CM | POA: Insufficient documentation

## 2021-01-01 LAB — SARS CORONAVIRUS 2 (TAT 6-24 HRS): SARS Coronavirus 2: NEGATIVE

## 2021-01-02 ENCOUNTER — Encounter (HOSPITAL_BASED_OUTPATIENT_CLINIC_OR_DEPARTMENT_OTHER): Payer: Self-pay | Admitting: Urology

## 2021-01-02 ENCOUNTER — Other Ambulatory Visit: Payer: Self-pay

## 2021-01-02 NOTE — Progress Notes (Addendum)
Spoke w/ via phone for pre-op interview--- Pt Lab needs dos---- no              Lab results------ no COVID test ------ done 01-01-2021 negative result in epic Arrive at ------- 0745 on 01-04-2021 NPO after MN  Medications to take morning of surgery ----- Keppra, Tegratol  (pt plans to take at [facility]) Diabetic medication ----- n/a Patient Special Instructions ----- n/a Pre-Op special Istructions -----  Pt has hx TBI w/ residual left hemiparesis (walks w/ cane/ wlaker) and mild cognitive impairment (pt is understanding but slow) Patient verbalized understanding of instructions that were given at this phone interview. Patient denies shortness of breath, chest pain, fever, cough at this phone interview.   Anesthesia Review:  Hx 1998 severe TBI w/  residual left spastic hemiparesis/ mild cognitive impairment;  Epilepsy since childhood.  Per pt had breakthough seizure 07-15-2020 ED visit , medication readjusted, and stated no seizure since then.  Pt sees Dr Mart Piggs note in epic.  Pt stated he has been taking his medication as prescribed.  PCP: Dustin Folks NP Neurologist: Dr Leonie Man (lov 10-11-2020 epic)

## 2021-01-03 NOTE — Progress Notes (Signed)
Patient meets [facility] guidelines per Janett Billow zanetto pa

## 2021-01-04 ENCOUNTER — Ambulatory Visit (HOSPITAL_BASED_OUTPATIENT_CLINIC_OR_DEPARTMENT_OTHER): Payer: Medicare Other | Admitting: Physician Assistant

## 2021-01-04 ENCOUNTER — Encounter (HOSPITAL_BASED_OUTPATIENT_CLINIC_OR_DEPARTMENT_OTHER)
Admission: RE | Disposition: A | Discharge status: Home / Self Care | Payer: Self-pay | Source: Home / Self Care | Attending: Urology

## 2021-01-04 ENCOUNTER — Ambulatory Visit (HOSPITAL_BASED_OUTPATIENT_CLINIC_OR_DEPARTMENT_OTHER)
Admission: RE | Admit: 2021-01-04 | Discharge: 2021-01-04 | Disposition: A | Discharge status: Home / Self Care | Payer: Medicare Other | Attending: Urology | Admitting: Urology

## 2021-01-04 ENCOUNTER — Other Ambulatory Visit: Payer: Self-pay

## 2021-01-04 ENCOUNTER — Encounter (HOSPITAL_BASED_OUTPATIENT_CLINIC_OR_DEPARTMENT_OTHER): Payer: Self-pay | Admitting: Urology

## 2021-01-04 DIAGNOSIS — Z79899 Other long term (current) drug therapy: Secondary | ICD-10-CM | POA: Diagnosis not present

## 2021-01-04 DIAGNOSIS — N401 Enlarged prostate with lower urinary tract symptoms: Secondary | ICD-10-CM | POA: Insufficient documentation

## 2021-01-04 DIAGNOSIS — N138 Other obstructive and reflux uropathy: Secondary | ICD-10-CM | POA: Diagnosis not present

## 2021-01-04 DIAGNOSIS — Z8546 Personal history of malignant neoplasm of prostate: Secondary | ICD-10-CM | POA: Insufficient documentation

## 2021-01-04 HISTORY — DX: Benign prostatic hyperplasia with lower urinary tract symptoms: N40.1

## 2021-01-04 HISTORY — PX: CYSTOSCOPY WITH INSERTION OF UROLIFT: SHX6678

## 2021-01-04 SURGERY — CYSTOSCOPY WITH INSERTION OF UROLIFT
Anesthesia: General | Site: Prostate

## 2021-01-04 MED ORDER — ONDANSETRON HCL 4 MG/2ML IJ SOLN
INTRAMUSCULAR | Status: DC | PRN
  Administered 2021-01-04: 4 mg via INTRAVENOUS

## 2021-01-04 MED ORDER — EPHEDRINE 5 MG/ML INJ
INTRAVENOUS | Status: AC
  Filled 2021-01-04: qty 10

## 2021-01-04 MED ORDER — FENTANYL CITRATE (PF) 100 MCG/2ML IJ SOLN
INTRAMUSCULAR | Status: DC | PRN
  Administered 2021-01-04 (×2): 25 ug via INTRAVENOUS

## 2021-01-04 MED ORDER — ONDANSETRON HCL 4 MG/2ML IJ SOLN: 4.0000 mg | Freq: Once | INTRAMUSCULAR | Status: DC | PRN

## 2021-01-04 MED ORDER — FENTANYL CITRATE (PF) 100 MCG/2ML IJ SOLN
INTRAMUSCULAR | Status: AC
  Filled 2021-01-04: qty 2

## 2021-01-04 MED ORDER — MIDAZOLAM HCL 2 MG/2ML IJ SOLN
INTRAMUSCULAR | Status: AC
  Filled 2021-01-04: qty 2

## 2021-01-04 MED ORDER — LACTATED RINGERS IV SOLN
INTRAVENOUS | Status: DC
  Administered 2021-01-04: 1000 mL via INTRAVENOUS

## 2021-01-04 MED ORDER — DEXAMETHASONE SODIUM PHOSPHATE 10 MG/ML IJ SOLN
INTRAMUSCULAR | Status: AC
  Filled 2021-01-04: qty 1

## 2021-01-04 MED ORDER — ACETAMINOPHEN 325 MG PO TABS: 325.0000 mg | ORAL_TABLET | ORAL | Status: DC | PRN

## 2021-01-04 MED ORDER — FENTANYL CITRATE (PF) 100 MCG/2ML IJ SOLN: 25.0000 ug | INTRAMUSCULAR | Status: DC | PRN

## 2021-01-04 MED ORDER — OXYCODONE HCL 5 MG PO TABS
5.0000 mg | ORAL_TABLET | Freq: Once | ORAL | Status: DC | PRN
Start: 2021-01-04 — End: 2021-01-04

## 2021-01-04 MED ORDER — PROPOFOL 10 MG/ML IV BOLUS
INTRAVENOUS | Status: DC | PRN
  Administered 2021-01-04: 150 mg via INTRAVENOUS

## 2021-01-04 MED ORDER — ONDANSETRON HCL 4 MG/2ML IJ SOLN
INTRAMUSCULAR | Status: AC
  Filled 2021-01-04: qty 2

## 2021-01-04 MED ORDER — ACETAMINOPHEN 10 MG/ML IV SOLN: 1000.0000 mg | Freq: Once | INTRAVENOUS | Status: DC | PRN

## 2021-01-04 MED ORDER — MEPERIDINE HCL 25 MG/ML IJ SOLN: 6.2500 mg | INTRAMUSCULAR | Status: DC | PRN

## 2021-01-04 MED ORDER — EPHEDRINE SULFATE-NACL 50-0.9 MG/10ML-% IV SOSY
PREFILLED_SYRINGE | INTRAVENOUS | Status: DC | PRN
  Administered 2021-01-04 (×2): 10 mg via INTRAVENOUS

## 2021-01-04 MED ORDER — DEXAMETHASONE SODIUM PHOSPHATE 4 MG/ML IJ SOLN
INTRAMUSCULAR | Status: DC | PRN
  Administered 2021-01-04: 4 mg via INTRAVENOUS

## 2021-01-04 MED ORDER — ACETAMINOPHEN 160 MG/5ML PO SOLN
325.0000 mg | ORAL | Status: DC | PRN
Start: 2021-01-04 — End: 2021-01-04

## 2021-01-04 MED ORDER — OXYCODONE HCL 5 MG/5ML PO SOLN: 5.0000 mg | Freq: Once | ORAL | Status: DC | PRN

## 2021-01-04 MED ORDER — LIDOCAINE HCL (PF) 2 % IJ SOLN
INTRAMUSCULAR | Status: AC
  Filled 2021-01-04: qty 5

## 2021-01-04 MED ORDER — STERILE WATER FOR IRRIGATION IR SOLN
Status: DC | PRN
  Administered 2021-01-04: 4500 mL

## 2021-01-04 MED ORDER — CEFAZOLIN SODIUM-DEXTROSE 2-4 GM/100ML-% IV SOLN
INTRAVENOUS | Status: AC
  Filled 2021-01-04: qty 100

## 2021-01-04 MED ORDER — LIDOCAINE 2% (20 MG/ML) 5 ML SYRINGE
INTRAMUSCULAR | Status: DC | PRN
  Administered 2021-01-04: 60 mg via INTRAVENOUS

## 2021-01-04 MED ORDER — CEFAZOLIN SODIUM-DEXTROSE 2-4 GM/100ML-% IV SOLN
2.0000 g | Freq: Once | INTRAVENOUS | Status: AC
  Administered 2021-01-04: 2 g via INTRAVENOUS

## 2021-01-04 SURGICAL SUPPLY — 26 items
BAG DRAIN URO-CYSTO SKYTR STRL (DRAIN) ×2 IMPLANT
BAG DRN RND TRDRP ANRFLXCHMBR (UROLOGICAL SUPPLIES) ×1
BAG DRN UROCATH (DRAIN) ×1
BAG URINE DRAIN 2000ML AR STRL (UROLOGICAL SUPPLIES) ×2 IMPLANT
BAG URINE LEG 500ML (DRAIN) IMPLANT
CATH COUDE FOLEY 2W 5CC 18FR (CATHETERS) ×2 IMPLANT
CATH FOLEY 2WAY SLVR  5CC 16FR (CATHETERS)
CATH FOLEY 2WAY SLVR  5CC 18FR (CATHETERS)
CATH FOLEY 2WAY SLVR 5CC 16FR (CATHETERS) IMPLANT
CATH FOLEY 2WAY SLVR 5CC 18FR (CATHETERS) IMPLANT
CLOTH BEACON ORANGE TIMEOUT ST (SAFETY) ×2 IMPLANT
ELECT REM PT RETURN 9FT ADLT (ELECTROSURGICAL)
ELECTRODE REM PT RTRN 9FT ADLT (ELECTROSURGICAL) IMPLANT
GLOVE BIO SURGEON STRL SZ7.5 (GLOVE) ×2 IMPLANT
GLOVE BIO SURGEON STRL SZ8 (GLOVE) IMPLANT
GOWN STRL REUS W/ TWL XL LVL3 (GOWN DISPOSABLE) ×1 IMPLANT
GOWN STRL REUS W/TWL XL LVL3 (GOWN DISPOSABLE) ×2
HOLDER FOLEY CATH W/STRAP (MISCELLANEOUS) ×2 IMPLANT
KIT TURNOVER CYSTO (KITS) ×2 IMPLANT
MANIFOLD NEPTUNE II (INSTRUMENTS) ×2 IMPLANT
NEEDLE HYPO 22GX1.5 SAFETY (NEEDLE) IMPLANT
NS IRRIG 500ML POUR BTL (IV SOLUTION) IMPLANT
PACK CYSTO (CUSTOM PROCEDURE TRAY) ×2 IMPLANT
SYSTEM UROLIFT (Male Continence) ×10 IMPLANT
TUBE CONNECTING 12X1/4 (SUCTIONS) ×2 IMPLANT
WATER STERILE IRR 3000ML UROMA (IV SOLUTION) ×4 IMPLANT

## 2021-01-04 NOTE — Anesthesia Postprocedure Evaluation (Signed)
Anesthesia Post Note  Patient: Patrick Torres  Procedure(s) Performed: CYSTOSCOPY WITH INSERTION OF UROLIFT x 5; FOLEY CATHETER INSERTION (N/A Prostate)     Patient location during evaluation: PACU Anesthesia Type: General Level of consciousness: awake Pain management: pain level controlled Vital Signs Assessment: post-procedure vital signs reviewed and stable Respiratory status: spontaneous breathing Cardiovascular status: stable Postop Assessment: no apparent nausea or vomiting Anesthetic complications: no   No complications documented.  Last Vitals:  Vitals:   01/04/21 1045 01/04/21 1100  BP: (!) 155/91 (!) 166/90  Pulse: 81 72  Resp: 17 17  Temp:    SpO2: 100% 100%    Last Pain:  Vitals:   01/04/21 1115  TempSrc:   PainSc: 0-No pain   Pain Goal: Patients Stated Pain Goal: 5 (01/04/21 0735)                 Huston Foley

## 2021-01-04 NOTE — Op Note (Signed)
Preoperative diagnosis: BPH with obstructive symptomatology. Postoperative diagnosis: Same  Principal procedure: Urolift procedure, with the placement of 5 implants.  Surgeon: Junious Silk  Anesthesia: General   Complications: None  Drains: 18 French Foley catheter, to leg bag.  Estimated blood loss: Less than 25 mL  Indications: 60 year old male with obstructive symptomatology felt to be related to BPH. The patient's symptoms have progressed despite medictions, and he has requested further management. Management options including TURP with resection/ablation of the prostate as well as Urolift were discussed. The patient has chosen to have a Urolift procedure. He has been instructed to the procedure as well as risks and complications which include but are not limited to infection, bleeding, and inadequate treatment with the Urolift procedure alone, anesthetic complications, among others. He understands these and desires to proceed.  Findings: Using the 23 French cystoscope, urethra and bladder were inspected. There were no urethral lesions. Prostatic urethra was obstructed secondary to bilobar hypertrophy with a s;ightly high bladder neck. No median lobe. The bladder was inspected circumferentially. This revealed no stone or foreign body. No mucosal lesions. Moderate trabeculation. Possible large tic at dome.   Description of procedure: The patient was properly identified in the holding area. He received preoperative antibiotics. He was taken to the operating room. After adeqaute anesthesia he was placed in lithotomy position. He was prepped and draped in usual sterile fashion. Proper timeout was performed to confirm patient and procedure.   A 64F cystoscope was inserted into the bladder. The cystoscopy bridge was replaced with a UroLift delivery device.The first treatment site was the patient's left side approximately 1.5cm distal to the bladder neck. The distal tip of the delivery device was then  angled laterally approximately 20 degrees and anteriorly at this position to compress the lateral lobe and lift the 1:30 BN position. The trigger was pulled, thereby deploying a needle containing the implant through the prostate. The needle was then retracted, allowing one end of the implant to be delivered to the capsular surface of the prostate. The implant was then tensioned to assure capsular seating and removal of slack monofilament. The device was then angled back toward midline and slowly advanced proximally until cystoscopic verification of the monofilament being centered in the delivery bay. The urethral end piece was then affixed to the monofilament thereby tailoring the size of the implant. Excess filament was then severed. The delivery device was then re-advanced into the bladder. The delivery device was then replaced with cystoscope and bridge and the implant location and opening effect was confirmed cystoscopically. The same procedure was then repeated on the right side (#2), but did not quite get anterior and distal to lift the 10:30 BN position, so I placed another here (#3) just slightly more anterior and more proximal. Two additional implants were delivered just proximal to the verumontanum, again one on left (#4) and one on right (#5), inferior to the proximal implants to create an squarely opened bladder neck in a stacked fashion following the same technique. A final cystoscopy was conducted first to inspect the location and state of each implant and second, to confirm the presence of a continuous anterior channel was present through the prostatic urethra with irrigation flow turned off. Five implants were delivered in total. Following this, the scope was removed and an 58 French Foley catheter was placed and hooked to dependent drainage due to some slight mucosal oozing anteriorly. He was then awakened and taken to the recovery area in stable condition. He tolerated the procedure  well.
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
## 2021-01-04 NOTE — Anesthesia Preprocedure Evaluation (Signed)
Anesthesia Evaluation  Patient identified by MRN, date of birth, ID band Patient awake    Reviewed: Allergy & Precautions, NPO status , Patient's Chart, lab work & pertinent test results  Airway Mallampati: II  TM Distance: >3 FB Neck ROM: Full    Dental  (+) Edentulous Upper, Edentulous Lower   Pulmonary neg pulmonary ROS,    Pulmonary exam normal        Cardiovascular negative cardio ROS Normal cardiovascular exam     Neuro/Psych  Headaches, Seizures -, Well Controlled,  negative psych ROS   GI/Hepatic negative GI ROS, Neg liver ROS,   Endo/Other  negative endocrine ROS  Renal/GU negative Renal ROS  negative genitourinary   Musculoskeletal  (+) Arthritis , Osteoarthritis,    Abdominal (+) + obese,   Peds negative pediatric ROS (+)  Hematology negative hematology ROS (+)   Anesthesia Other Findings Uses walker to ambulate  Reproductive/Obstetrics negative OB ROS                             Anesthesia Physical  Anesthesia Plan  ASA: III  Anesthesia Plan: General   Post-op Pain Management:    Induction: Intravenous  PONV Risk Score and Plan: 3 and Ondansetron, Dexamethasone and Treatment may vary due to age or medical condition  Airway Management Planned: LMA  Additional Equipment: None  Intra-op Plan:   Post-operative Plan: Extubation in OR  Informed Consent: I have reviewed the patients History and Physical, chart, labs and discussed the procedure including the risks, benefits and alternatives for the proposed anesthesia with the patient or authorized representative who has indicated his/her understanding and acceptance.     Dental advisory given  Plan Discussed with: CRNA  Anesthesia Plan Comments:         Anesthesia Quick Evaluation

## 2021-01-04 NOTE — Anesthesia Procedure Notes (Signed)
Procedure Name: LMA Insertion Date/Time: 01/04/2021 9:30 AM Performed by: Lieutenant Diego, CRNA Pre-anesthesia Checklist: Patient identified, Emergency Drugs available, Suction available and Patient being monitored Patient Re-evaluated:Patient Re-evaluated prior to induction Oxygen Delivery Method: Circle system utilized Preoxygenation: Pre-oxygenation with 100% oxygen Induction Type: IV induction Ventilation: Mask ventilation without difficulty LMA: LMA inserted LMA Size: 5.0 Number of attempts: 1 Placement Confirmation: positive ETCO2 and breath sounds checked- equal and bilateral Tube secured with: Tape Dental Injury: Teeth and Oropharynx as per pre-operative assessment

## 2021-01-04 NOTE — Discharge Instructions (Signed)
Prostatic Urethral Lift, Care After This sheet gives you information about how to care for yourself after your procedure. Your health care provider may also give you more specific instructions. If you have problems or questions, contact your health care provider. What can I expect after the procedure? After the procedure, it is common to have:  Discomfort or burning when urinating.  An increased urge to urinate.  More frequent urination.  Urine that is slightly blood-tinged. These symptoms should go away after a few days. Follow these instructions at home:  Take over-the-counter and prescription medicines only as told by your health care provider.  Do not drive for 24 hours if you were given a medicine to help you relax (sedative).  Do not drive or use heavy machinery while taking prescription pain medicine.  Do not lift anything that is heavier than 10 lb (4.5 kg) until your health care provider says that this is safe.  Return to your normal activities as told by your health care provider. Ask your health care provider what activities are safe for you. Ask when you can return to sexual activity.  Drink enough fluid to keep your urine clear or pale yellow.  Keep all follow-up visits as told by your health care provider. This is important.   Contact a health care provider if:  You have chills or a fever.  You have pain when passing urine.  You have bright red blood or blood clots in your urine.  You have difficulty passing urine.  You have leaking of urine (incontinence). Get help right away if:  You have chest pain or shortness of breath.  You have leg pain or swelling.  You cannot pass urine. Summary  After the procedure, it is common to have discomfort or burning when urinating, an increased urge to urinate, more frequent urination, and urine that is slightly blood-tinged.  Do not drive for 24 hours if you were given a medicine to help you relax (sedative). Do not  drive or use heavy machinery while taking prescription pain medicine.  Do not lift anything that is heavier than 10 lb (4.5 kg) until your health care provider says that this is safe.  Return to your normal activities as told by your health care provider. This information is not intended to replace advice given to you by your health care provider. Make sure you discuss any questions you have with your health care provider. Document Revised: 08/16/2020 Document Reviewed: 08/16/2020 Elsevier Patient Education  2021 [place] CARE INSTRUCTIONS  Activity: Rest for the remainder of the day.  Do not drive or operate equipment today.  You may resume normal activities in one to two days as instructed by your physician.   Meals: Drink plenty of liquids and eat light foods such as gelatin or soup this evening.  You may return to a normal meal plan tomorrow.  Return to Work: You may return to work in one to two days or as instructed by your physician.  Special Instructions / Symptoms: Call your physician if any of these symptoms occur:   -persistent or heavy bleeding  -bleeding which continues after first few urination  -large blood clots that are difficult to pass  -urine stream diminishes or stops completely  -fever equal to or higher than 101 degrees Farenheit.  -cloudy urine with a strong, foul odor  -severe pain  Females should always wipe from front to back after elimination.  You may feel some burning pain when  you urinate.  This should disappear with time.  Applying moist heat to the lower abdomen or a hot tub bath may help relieve the pain. \  Follow-Up / Date of Return Visit to Your Physician:  As instructed Call for an appointment to arrange follow-up.   Post Anesthesia Home Care Instructions  Activity: Get plenty of rest for the remainder of the day. A responsible individual must stay with you for 24 hours following the procedure.  For the next 24  hours, DO NOT: -Drive a car -Paediatric nurse -Drink alcoholic beverages -Take any medication unless instructed by your physician -Make any legal decisions or sign important papers.  Meals: Start with liquid foods such as gelatin or soup. Progress to regular foods as tolerated. Avoid greasy, spicy, heavy foods. If nausea and/or vomiting occur, drink only clear liquids until the nausea and/or vomiting subsides. Call your physician if vomiting continues.  Special Instructions/Symptoms: Your throat may feel dry or sore from the anesthesia or the breathing tube placed in your throat during surgery. If this causes discomfort, gargle with warm salt water. The discomfort should disappear within 24 hours.  If you had a scopolamine patch placed behind your ear for the management of post- operative nausea and/or vomiting:  1. The medication in the patch is effective for 72 hours, after which it should be removed.  Wrap patch in a tissue and discard in the trash. Wash hands thoroughly with soap and water. 2. You may remove the patch earlier than 72 hours if you experience unpleasant side effects which may include dry mouth, dizziness or visual disturbances. 3. Avoid touching the patch. Wash your hands with soap and water after contact with the patch.

## 2021-01-04 NOTE — Progress Notes (Signed)
Dr Junious Silk notified of patient's inability to void despite several attempts.  Only dribbling scant amounts of bloody urine.  To bladder scan patient.  Dr Junious Silk enroute to see patient.

## 2021-01-04 NOTE — Transfer of Care (Signed)
Immediate Anesthesia Transfer of Care Note  Patient: ROSHARD REZABEK  Procedure(s) Performed: CYSTOSCOPY WITH INSERTION OF UROLIFT x 5; FOLEY CATHETER INSERTION (N/A Prostate)  Patient Location: PACU  Anesthesia Type:General  Level of Consciousness: awake  Airway & Oxygen Therapy: Patient Spontanous Breathing and Patient connected to face mask oxygen  Post-op Assessment: Report given to RN and Post -op Vital signs reviewed and stable  Post vital signs: Reviewed and stable  Last Vitals:  Vitals Value Taken Time  BP 128/76 01/04/21 1032  Temp    Pulse 61 01/04/21 1034  Resp 12 01/04/21 1034  SpO2 100 % 01/04/21 1034  Vitals shown include unvalidated device data.  Last Pain:  Vitals:   01/04/21 0735  TempSrc: Oral  PainSc: 0-No pain      Patients Stated Pain Goal: 5 (12/30/30 3557)  Complications: No complications documented.

## 2021-01-04 NOTE — Progress Notes (Signed)
Pt returned call about leaving AVS at surgery center. Reviewed pt's follow-up appointment and questions about instructions. Pt stated that he has no further questions and has phone number for MD office if he has any concerns.

## 2021-01-05 NOTE — Progress Notes (Signed)
Urine was clear in PACU and I had nurse d/c his foley with plan to d/c home without a foley. She was not able to fill him for VT. Pt did not void throughout the day.   Pt attempted one more time in the phase II recovery bay and could not.We went over the nature r/b/a to foley as I didn't want him to have to come to ED later for a foley if he went home without voiding. He elected to have foley. He was reclined in the chair and I prepped and draped in usual sterile fashion. I placed a 16Fr foley without difficulty which drained clear urine - about 300 ml.   Will plan for VT in about 5 days one morning in office.

## 2021-01-07 ENCOUNTER — Encounter (HOSPITAL_BASED_OUTPATIENT_CLINIC_OR_DEPARTMENT_OTHER): Payer: Self-pay | Admitting: Urology

## 2021-04-10 ENCOUNTER — Telehealth: Payer: Self-pay | Admitting: Neurology

## 2021-04-10 NOTE — Telephone Encounter (Signed)
Pt believes he took too much of his medication last night and is now feeling drowsey, pt asking to be called

## 2021-04-11 ENCOUNTER — Encounter: Payer: Self-pay | Admitting: Adult Health

## 2021-04-11 ENCOUNTER — Ambulatory Visit (INDEPENDENT_AMBULATORY_CARE_PROVIDER_SITE_OTHER): Payer: Medicare Other | Admitting: Adult Health

## 2021-04-11 VITALS — BP 133/85 | HR 59 | Ht 68.0 in | Wt 222.0 lb

## 2021-04-11 DIAGNOSIS — R569 Unspecified convulsions: Secondary | ICD-10-CM | POA: Diagnosis not present

## 2021-04-11 MED ORDER — LEVETIRACETAM 750 MG PO TABS: 1500.0000 mg | ORAL_TABLET | Freq: Two times a day (BID) | ORAL | 3 refills | Status: DC

## 2021-04-11 MED ORDER — CARBAMAZEPINE ER 400 MG PO TB12: 400.0000 mg | ORAL_TABLET | Freq: Two times a day (BID) | ORAL | 3 refills | Status: DC

## 2021-04-11 NOTE — Patient Instructions (Addendum)
Your Plan:  Continue Keppra 1500 mg twice daily and carbamazepine 400 mg twice daily for seizure prevention    Follow-up in 6 months or call earlier if needed      Thank you for coming to see [place] at [facility]. I hope we have been able to provide you high quality care today.  You may receive a patient satisfaction survey over the next few weeks. We would appreciate your feedback and comments so that we may continue to improve ourselves and the health of our patients.
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
## 2021-04-15 NOTE — Progress Notes (Signed)
I agree with the above plan 

## 2021-10-17 ENCOUNTER — Encounter: Payer: Self-pay | Admitting: Adult Health

## 2021-10-17 ENCOUNTER — Ambulatory Visit: Payer: Medicare Other | Admitting: Adult Health

## 2021-10-17 VITALS — BP 126/79 | HR 62 | Ht 68.0 in | Wt 218.0 lb

## 2021-10-17 DIAGNOSIS — Z5181 Encounter for therapeutic drug level monitoring: Secondary | ICD-10-CM

## 2021-10-17 DIAGNOSIS — R569 Unspecified convulsions: Secondary | ICD-10-CM | POA: Diagnosis not present

## 2021-10-17 MED ORDER — LEVETIRACETAM 750 MG PO TABS
1500.0000 mg | ORAL_TABLET | Freq: Two times a day (BID) | ORAL | 3 refills | Status: DC
Start: 2021-10-17 — End: 2022-10-20

## 2021-10-17 MED ORDER — CARBAMAZEPINE ER 400 MG PO TB12: 400.0000 mg | ORAL_TABLET | Freq: Two times a day (BID) | ORAL | 3 refills | Status: DC
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
## 2021-10-18 LAB — COMPREHENSIVE METABOLIC PANEL
ALT: 20 IU/L (ref 0–44)
AST: 13 IU/L (ref 0–40)
Albumin/Globulin Ratio: 1.7 (ref 1.2–2.2)
Albumin: 4.3 g/dL (ref 3.8–4.8)
Alkaline Phosphatase: 69 IU/L (ref 44–121)
BUN/Creatinine Ratio: 11 (ref 10–24)
BUN: 10 mg/dL (ref 8–27)
Bilirubin Total: 0.2 mg/dL (ref 0.0–1.2)
CO2: 22 mmol/L (ref 20–29)
Calcium: 9.1 mg/dL (ref 8.6–10.2)
Chloride: 109 mmol/L — ABNORMAL HIGH (ref 96–106)
Creatinine, Ser: 0.95 mg/dL (ref 0.76–1.27)
Globulin, Total: 2.5 g/dL (ref 1.5–4.5)
Glucose: 99 mg/dL (ref 70–99)
Potassium: 4.1 mmol/L (ref 3.5–5.2)
Sodium: 143 mmol/L (ref 134–144)
Total Protein: 6.8 g/dL (ref 6.0–8.5)
eGFR: 90 mL/min/{1.73_m2} (ref >59)

## 2021-10-18 LAB — CBC
Hematocrit: 43.1 % (ref 37.5–51.0)
Hemoglobin: 14.4 g/dL (ref 13.0–17.7)
MCH: 30.2 pg (ref 26.6–33.0)
MCHC: 33.4 g/dL (ref 31.5–35.7)
MCV: 90 fL (ref 79–97)
Platelets: 207 10*3/uL (ref 150–450)
RBC: 4.77 x10E6/uL (ref 4.14–5.80)
RDW: 12.7 % (ref 11.6–15.4)
WBC: 5 10*3/uL (ref 3.4–10.8)

## 2021-10-18 LAB — CARBAMAZEPINE LEVEL, TOTAL: Carbamazepine (Tegretol), S: 11.7 ug/mL (ref 4.0–12.0)

## 2021-10-21 ENCOUNTER — Telehealth: Payer: Self-pay

## 2021-10-21 NOTE — Telephone Encounter (Signed)
-----   Message from Frann Rider, NP sent at 10/21/2021  4:43 PM EDT ----- Please advise patient/family that recent lab work satisfactory levels.  Continue current dosage of carbamazepine.  Thank you.

## 2021-10-21 NOTE — Telephone Encounter (Signed)
Contacted pt, informed him his labs came back satisfactory, no concern. Advised to continue current carbamazepine. Pt understood and was advised to call the office with questions as he had none at the time.

## 2022-08-06 ENCOUNTER — Other Ambulatory Visit: Payer: Self-pay | Admitting: Adult Health

## 2022-09-11 ENCOUNTER — Encounter: Payer: Self-pay | Admitting: Gastroenterology
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
## 2022-10-20 ENCOUNTER — Encounter: Payer: Self-pay | Admitting: Adult Health

## 2022-10-20 ENCOUNTER — Ambulatory Visit (INDEPENDENT_AMBULATORY_CARE_PROVIDER_SITE_OTHER): Payer: Medicare HMO | Admitting: Adult Health

## 2022-10-20 ENCOUNTER — Ambulatory Visit: Payer: Medicare HMO | Admitting: Adult Health

## 2022-10-20 VITALS — BP 115/63 | HR 68 | Ht 68.0 in | Wt 221.4 lb

## 2022-10-20 DIAGNOSIS — R569 Unspecified convulsions: Secondary | ICD-10-CM

## 2022-10-20 DIAGNOSIS — Z5181 Encounter for therapeutic drug level monitoring: Secondary | ICD-10-CM | POA: Diagnosis not present

## 2022-10-20 MED ORDER — LEVETIRACETAM 750 MG PO TABS: 1500.0000 mg | ORAL_TABLET | Freq: Two times a day (BID) | ORAL | 3 refills | Status: DC

## 2022-10-20 MED ORDER — CARBAMAZEPINE ER 400 MG PO TB12
400.0000 mg | ORAL_TABLET | Freq: Two times a day (BID) | ORAL | 3 refills | Status: DC
Start: 2022-10-20 — End: 2023-10-26
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
## 2022-10-20 NOTE — Patient Instructions (Signed)
It was great to see you again today!   No changes at this time, please continue current dose of Keppra and carbamazepine  We will check lab work today, you will be called with any normal findings    Follow-up in 1 year or call earlier if needed

## 2022-10-21 ENCOUNTER — Telehealth: Payer: Self-pay | Admitting: Adult Health

## 2022-10-21 LAB — IRON AND TIBC
Iron Saturation: 30 % (ref 15–55)
Iron: 70 ug/dL (ref 38–169)
Total Iron Binding Capacity: 232 ug/dL — ABNORMAL LOW (ref 250–450)
UIBC: 162 ug/dL (ref 111–343)

## 2022-10-21 LAB — CARBAMAZEPINE LEVEL, TOTAL: Carbamazepine (Tegretol), S: 12.8 ug/mL (ref 4.0–12.0)

## 2022-10-21 NOTE — Telephone Encounter (Signed)
Pt called stating that he was told in the office that his Medicaid is not active. Pt called his medicaid and they informed him that he was active and they gave him the Medicaid # [id number] O and [place] [facility] Please advise.

## 2023-08-03 ENCOUNTER — Other Ambulatory Visit: Payer: Self-pay | Admitting: Adult Health

## 2023-10-19 ENCOUNTER — Ambulatory Visit: Payer: Medicare HMO | Admitting: Adult Health

## 2023-10-19 ENCOUNTER — Encounter: Payer: Self-pay | Admitting: Adult Health
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
## 2023-10-26 ENCOUNTER — Other Ambulatory Visit: Payer: Self-pay | Admitting: Adult Health

## 2023-10-26 ENCOUNTER — Telehealth: Payer: Self-pay | Admitting: Adult Health

## 2023-10-26 ENCOUNTER — Other Ambulatory Visit: Payer: Self-pay

## 2023-10-26 MED ORDER — CARBAMAZEPINE ER 400 MG PO TB12: 400.0000 mg | ORAL_TABLET | Freq: Two times a day (BID) | ORAL | 1 refills | Status: DC

## 2023-10-26 NOTE — Telephone Encounter (Signed)
Refill sent until appt is completed

## 2023-10-26 NOTE — Telephone Encounter (Signed)
Pt has scheduled his needed f/u and is on wait list, pt asking for a refill on his carbamazepine (TEGRETOL XR) 400 MG 12 hr tablet  to [facility]

## 2024-01-25 ENCOUNTER — Other Ambulatory Visit: Payer: Self-pay | Admitting: Adult Health

## 2024-01-25 NOTE — Telephone Encounter (Signed)
Last seen on 10/20/22 Follow up scheduled on 03/29/24

## 2024-03-25 ENCOUNTER — Encounter (HOSPITAL_COMMUNITY): Payer: Self-pay

## 2024-03-25 ENCOUNTER — Ambulatory Visit (HOSPITAL_COMMUNITY): Admission: EM | Admit: 2024-03-25 | Discharge: 2024-03-25 | Disposition: A | Discharge status: Home / Self Care

## 2024-03-25 DIAGNOSIS — K625 Hemorrhage of anus and rectum: Secondary | ICD-10-CM
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
## 2024-03-25 NOTE — ED Triage Notes (Signed)
 Pt states that he has some rectal bleeding. X1 week Pt states that he does experience constipation at times.

## 2024-03-25 NOTE — Discharge Instructions (Addendum)
 I suspect that the rectal bleeding may be from internal hemorrhoids.  Please stop taking the Mobic as this is an NSAID and can increase your risk of gastrointestinal bleeding.  You can take 500 mg of Tylenol every 8 hours as needed for your arthritis pain.  It is important that you follow-up with your gastroenterologist for your colonoscopy and further examination of your rectal bleeding.  Below are some recommendations for constipation.  Please do not strain when having bowel movements as this can increase your risk of developing hemorrhoids.  For moderate to severe constipation (not having a bowel movement in more than 3 days) then try to use an enema or Miralax once daily until you have a good bowel movement.  It is not a good idea to use an enema or laxatives daily. If you find you are doing this, then please follow up with a gastroenterologist. Otherwise, a medication you could use daily to help with promoting bowel movements is docusate (Colace) 100mg . It is okay to use this 1-2 times daily as a stool softener.  Try to stay active physically including regular exercise 2-3 times a week.  Make sure you hydrate well every day with about 64 ounces of water daily (that is 2 liters).  Try to avoid carb heavy foods, dairy. This includes cutting out breads, pasta, pizza, pastries, potatoes, rice, starchy foods in general. Eat more fiber as listed below:  Salads - kale, spinach, cabbage, spring mix, arugula Fruits - avocadoes, berries (blueberries, raspberries, blackberries), apples, oranges, pomegranate, grapefruit, kiwi Vegetables - asparagus, cauliflower, broccoli, green beans, brussel sprouts, bell peppers, beets; stay away from or limit starchy vegetables like potatoes, carrots, peas Other general foods - kidney beans, egg whites, almonds, walnuts, sunflower seeds, pumpkin seeds, fat free yogurt, almond milk, flax seeds, quinoa, oats  Meat - It is better to eat lean meats and limit your red meat  including pork to once a week.  Wild caught fish, chicken breast are good options as they tend to be leaner sources of good protein. Still be mindful of the sodium labels for the meats you buy.  DO NOT EAT ANY FOODS ON THIS LIST THAT YOU ARE ALLERGIC TO. For more specific needs, I highly recommend consulting a dietician or nutritionist but this can definitely be a good starting point.

## 2024-03-29 ENCOUNTER — Ambulatory Visit: Admitting: Gastroenterology

## 2024-03-29 ENCOUNTER — Encounter: Payer: Self-pay | Admitting: Adult Health

## 2024-03-29 ENCOUNTER — Ambulatory Visit (INDEPENDENT_AMBULATORY_CARE_PROVIDER_SITE_OTHER): Payer: Medicare HMO | Admitting: Adult Health

## 2024-03-29 VITALS — BP 138/82 | HR 57 | Ht 68.0 in | Wt 228.0 lb

## 2024-03-29 DIAGNOSIS — Z5181 Encounter for therapeutic drug level monitoring: Secondary | ICD-10-CM

## 2024-03-29 DIAGNOSIS — R569 Unspecified convulsions: Secondary | ICD-10-CM | POA: Diagnosis not present

## 2024-03-29 MED ORDER — CARBAMAZEPINE ER 400 MG PO TB12: 400.0000 mg | ORAL_TABLET | Freq: Two times a day (BID) | ORAL | 3 refills | Status: DC

## 2024-03-29 MED ORDER — LEVETIRACETAM 750 MG PO TABS: 1500.0000 mg | ORAL_TABLET | Freq: Two times a day (BID) | ORAL | 3 refills | Status: DC
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
## 2024-03-29 NOTE — Patient Instructions (Addendum)
 Your Plan:  Continue Keppra and carbamazepine for seizure prevention  Will check carbamazepine level today       Follow up in 1 year or call earlier if needed       Thank you for coming to see [place] at [facility]. I hope we have been able to provide you high quality care today.  You may receive a patient satisfaction survey over the next few weeks. We would appreciate your feedback and comments so that we may continue to improve ourselves and the health of our patients.

## 2024-03-30 LAB — CARBAMAZEPINE LEVEL, TOTAL: Carbamazepine (Tegretol), S: 13.6 ug/mL (ref 4.0–12.0)

## 2024-03-31 ENCOUNTER — Telehealth: Payer: Self-pay | Admitting: Neurology

## 2024-03-31 NOTE — Telephone Encounter (Signed)
-----   Message from [place] sent at 03/30/2024  4:29 PM EDT ----- Please advise patient that recent carbamazepine level slightly above normal but as he is tolerating well and adequately controlling seizures, recommend he continue with current dosage.

## 2024-03-31 NOTE — Telephone Encounter (Signed)
 Called the patient to review the lab work. There was no answer. Left a detailed message advising the patient that the carbamazepine level was elevated but given the seizures are well treated and the patient is tolerating everything, she would not recommend making any changes. Instructed the pt to call with questions or concerns

## 2024-04-04 ENCOUNTER — Ambulatory Visit (INDEPENDENT_AMBULATORY_CARE_PROVIDER_SITE_OTHER): Admitting: Gastroenterology

## 2024-04-04 ENCOUNTER — Encounter: Payer: Self-pay | Admitting: Gastroenterology

## 2024-04-04 VITALS — BP 122/68 | HR 64 | Ht 68.0 in | Wt 227.0 lb

## 2024-04-04 DIAGNOSIS — K625 Hemorrhage of anus and rectum: Secondary | ICD-10-CM | POA: Diagnosis not present

## 2024-04-04 DIAGNOSIS — Z860101 Personal history of adenomatous and serrated colon polyps: Secondary | ICD-10-CM

## 2024-04-04 DIAGNOSIS — Z860100 Personal history of colon polyps, unspecified: Secondary | ICD-10-CM | POA: Insufficient documentation

## 2024-04-04 MED ORDER — NA SULFATE-K SULFATE-MG SULF 17.5-3.13-1.6 GM/177ML PO SOLN: 1.0000 | Freq: Once | ORAL | 0 refills | Status: AC

## 2024-04-04 NOTE — Patient Instructions (Signed)
 You have been scheduled for a colonoscopy. Please follow written instructions given to you at your visit today.   If you use inhalers (even only as needed), please bring them with you on the day of your procedure.  DO NOT TAKE 7 DAYS PRIOR TO TEST- Trulicity (dulaglutide) Ozempic, Wegovy (semaglutide) Mounjaro (tirzepatide) Bydureon Bcise (exanatide extended release)  DO NOT TAKE 1 DAY PRIOR TO YOUR TEST Rybelsus (semaglutide) Adlyxin (lixisenatide) Victoza (liraglutide) Byetta (exanatide) _______________________________________________________  If your blood pressure at your visit was 140/90 or greater, please contact your primary care physician to follow up on this.  _______________________________________________________  If you are age 56 or older, your body mass index should be between 23-30. Your Body mass index is 34.52 kg/m. If this is out of the aforementioned range listed, please consider follow up with your Primary Care Provider.  If you are age 43 or younger, your body mass index should be between 19-25. Your Body mass index is 34.52 kg/m. If this is out of the aformentioned range listed, please consider follow up with your Primary Care Provider.   ________________________________________________________  The [facility] providers would like to encourage you to use MYCHART to communicate with providers for non-urgent requests or questions.  Due to long hold times on the telephone, sending your provider a message by [facility] may be a faster and more efficient way to get a response.  Please allow 48 business hours for a response.  Please remember that this is for non-urgent requests.  _______________________________________________________
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
## 2024-04-28 ENCOUNTER — Telehealth: Payer: Self-pay | Admitting: Adult Health

## 2024-04-28 NOTE — Telephone Encounter (Signed)
 Appointment details confirmed

## 2024-05-02 ENCOUNTER — Encounter: Payer: Self-pay | Admitting: Gastroenterology

## 2024-05-10 ENCOUNTER — Ambulatory Visit (AMBULATORY_SURGERY_CENTER): Admitting: Gastroenterology

## 2024-05-10 ENCOUNTER — Encounter: Payer: Self-pay | Admitting: Gastroenterology

## 2024-05-10 VITALS — BP 148/70 | HR 56 | Temp 99.0°F | Resp 17 | Ht 68.0 in | Wt 227.0 lb

## 2024-05-10 DIAGNOSIS — Z1211 Encounter for screening for malignant neoplasm of colon: Secondary | ICD-10-CM

## 2024-05-10 DIAGNOSIS — D120 Benign neoplasm of cecum: Secondary | ICD-10-CM | POA: Diagnosis not present

## 2024-05-10 DIAGNOSIS — D128 Benign neoplasm of rectum: Secondary | ICD-10-CM

## 2024-05-10 DIAGNOSIS — Z860100 Personal history of colon polyps, unspecified: Secondary | ICD-10-CM

## 2024-05-10 DIAGNOSIS — K625 Hemorrhage of anus and rectum: Secondary | ICD-10-CM

## 2024-05-10 DIAGNOSIS — D123 Benign neoplasm of transverse colon: Secondary | ICD-10-CM

## 2024-05-10 DIAGNOSIS — K648 Other hemorrhoids: Secondary | ICD-10-CM

## 2024-05-10 DIAGNOSIS — Z860101 Personal history of adenomatous and serrated colon polyps: Secondary | ICD-10-CM

## 2024-05-10 MED ORDER — SODIUM CHLORIDE 0.9 % IV SOLN: 500.0000 mL | Freq: Once | INTRAVENOUS | Status: DC

## 2024-05-10 NOTE — Op Note (Signed)
 [facility] Patient Name: Patrick Torres Procedure Date: 05/10/2024 2:57 PM MRN: [id number] Endoscopist: Landon Pinion P. General Kenner , MD, [id number] Age: 63 Referring MD:  Date of Birth: 03-21-1961 Gender: Male Account #: [id number] Procedure:                Colonoscopy Indications:              High risk colon cancer surveillance: Personal                            history of colonic polyps - last exam 08/2015 -                            sessile serrated polyps Medicines:                Monitored Anesthesia Care Procedure:                Pre-Anesthesia Assessment:                           - Prior to the procedure, a History and Physical                            was performed, and patient medications and                            allergies were reviewed. The patient's tolerance of                            previous anesthesia was also reviewed. The risks                            and benefits of the procedure and the sedation                            options and risks were discussed with the patient.                            All questions were answered, and informed consent                            was obtained. Prior Anticoagulants: The patient has                            taken no anticoagulant or antiplatelet agents. ASA                            Grade Assessment: III - A patient with severe                            systemic disease. After reviewing the risks and                            benefits, the patient was deemed in satisfactory  condition to undergo the procedure.                           After obtaining informed consent, the colonoscope                            was passed under direct vision. Throughout the                            procedure, the patient's blood pressure, pulse, and                            oxygen saturations were monitored continuously. The                            CF [id number] #[id number] was  introduced through the anus                            and advanced to the the cecum, identified by                            appendiceal orifice and ileocecal valve. The                            colonoscopy was performed without difficulty. The                            patient tolerated the procedure well. The quality                            of the bowel preparation was good. The ileocecal                            valve, appendiceal orifice, and rectum were                            photographed. Scope In: 3:17:06 PM Scope Out: 3:50:17 PM Scope Withdrawal Time: 0 hours 28 minutes 1 second  Total Procedure Duration: 0 hours 33 minutes 11 seconds  Findings:                 The perianal and digital rectal examinations were                            normal.                           A 3 mm polyp was found in the cecum. The polyp was                            sessile. The polyp was removed with a cold snare.                            Resection and retrieval were complete.  A 3 mm polyp was found in the transverse colon. The                            polyp was sessile. The polyp was removed with a                            cold snare. Resection and retrieval were complete.                           A 20 to 25 mm multilobulated polyp was found in the                            rectum. The polyp was semi-pedunculated. Area was                            successfully injected with 5 mL saline for a lift                            polypectomy prior to polypectomy. The polyp was                            removed with a hot snare in one piece. Resection                            and retrieval were complete. There was persistent                            oozing post polypectomy. Snare tip coagulation was                            used initially which mostly stopped it. For further                            hemostasis, one hemostatic clip was successfully                             placed across the polypectomy site with good result.                           Internal hemorrhoids were found.                           The exam was otherwise without abnormality. Of                            note, the rectum was restricted, with poor air                            retention, and retroflexed views not able to be                            obtained. Complications:  No immediate complications. Estimated blood loss:                            Minimal. Estimated Blood Loss:     Estimated blood loss was minimal. Impression:               - One 3 mm polyp in the cecum, removed with a cold                            snare. Resected and retrieved.                           - One 3 mm polyp in the transverse colon, removed                            with a cold snare. Resected and retrieved.                           - One 20 to 25 mm polyp in the rectum, removed with                            a hot snare. Injection with saline and resected and                            retrieved. Snare tip coagulation / Clip was placed                            for hemostasis post polypectomy.                           - Internal hemorrhoids.                           - The examination was otherwise normal. Recommendation:           - Patient has a contact number available for                            emergencies. The signs and symptoms of potential                            delayed complications were discussed with the                            patient. Return to normal activities tomorrow.                            Written discharge instructions were provided to the                            patient.                           - Resume previous diet.                           -  Continue present medications.                           - Await pathology results.                           - No ibuprofen , naproxen , or other non-steroidal                             anti-inflammatory drugs for 2 weeks after polyp                            removal. Landon Pinion P. Amarie Tarte, MD 05/10/2024 4:02:13 PM This report has been signed electronically.
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
## 2024-05-10 NOTE — Patient Instructions (Signed)
 YOU HAD AN ENDOSCOPIC PROCEDURE TODAY AT THE [facility]:   Refer to the procedure report that was given to you for any specific questions about what was found during the examination.  If the procedure report does not answer your questions, please call your gastroenterologist to clarify.  If you requested that your care partner not be given the details of your procedure findings, then the procedure report has been included in a sealed envelope for you to review at your convenience later.  YOU SHOULD EXPECT: Some feelings of bloating in the abdomen. Passage of more gas than usual.  Walking can help get rid of the air that was put into your GI tract during the procedure and reduce the bloating. If you had a lower endoscopy (such as a colonoscopy or flexible sigmoidoscopy) you may notice spotting of blood in your stool or on the toilet paper. If you underwent a bowel prep for your procedure, you may not have a normal bowel movement for a few days.  Please Note:  You might notice some irritation and congestion in your nose or some drainage.  This is from the oxygen used during your procedure.  There is no need for concern and it should clear up in a day or so.  SYMPTOMS TO REPORT IMMEDIATELY:  Following lower endoscopy (colonoscopy or flexible sigmoidoscopy):  Excessive amounts of blood in the stool  Significant tenderness or worsening of abdominal pains  Swelling of the abdomen that is new, acute  Fever of 100F or higher   For urgent or emergent issues, a gastroenterologist can be reached at any hour by calling [phone]. Do not use MyChart messaging for urgent concerns.    DIET:  We do recommend a small meal at first, but then you may proceed to your regular diet.  Drink plenty of fluids but you should avoid alcoholic beverages for 24 hours.  MEDICATIONS: Continue present medications. No Ibuprofen , Naproxen , Alleve, or other non-steroidal anti-inflammatory drugs (NSAIDs) for 2  weeks after polyp removal.  Please see handouts given to you by your recovery nurse: Polyps, hemorrhoids. Clip card given to patient with instructions.  FOLLOW UP:  Await pathology results.  Thank you for allowing us  to provide for your healthcare needs today.   ACTIVITY:  You should plan to take it easy for the rest of today and you should NOT DRIVE or use heavy machinery until tomorrow (because of the sedation medicines used during the test).    FOLLOW UP: Our staff will call the number listed on your records the next business day following your procedure.  We will call around 7:15- 8:00 am to check on you and address any questions or concerns that you may have regarding the information given to you following your procedure. If we do not reach you, we will leave a message.     If any biopsies were taken you will be contacted by phone or by letter within the next 1-3 weeks.  Please call us  at [phone] if you have not heard about the biopsies in 3 weeks.    SIGNATURES/CONFIDENTIALITY: You and/or your care partner have signed paperwork which will be entered into your electronic medical record.  These signatures attest to the fact that that the information above on your After Visit Summary has been reviewed and is understood.  Full responsibility of the confidentiality of this discharge information lies with you and/or your care-partner.

## 2024-05-10 NOTE — Progress Notes (Signed)
 Vss nad trans to pacu

## 2024-05-10 NOTE — Progress Notes (Signed)
   Established Patient Office Visit  Subjective   Patient ID: Patrick Torres, male    DOB: 1961/06/15  Age: 63 y.o. MRN: [id number]  Chief Complaint  Patient presents with   Colonoscopy    Hx colon polyps; rectal bleeding    HPI    ROS    Objective:     BP (!) 154/95   Pulse (!) 57   Temp 99 F (37.2 C)   Resp 12   Ht 5\' 8"  (1.727 m)   Wt 227 lb (103 kg)   SpO2 99%   BMI 34.52 kg/m    Physical Exam   No results found for any visits on 05/10/24.    The ASCVD Risk score (Arnett DK, et al., 2019) failed to calculate for the following reasons:   Cannot find a previous HDL lab   Cannot find a previous total cholesterol lab    Assessment & Plan:   Problem List Items Addressed This Visit       Digestive   Rectal bleeding   Relevant Medications   0.9 %  sodium chloride  infusion   Other Relevant Orders   Diet NPO   Hypoglycemia/Hyperglycemia protocol   Emergency Medications   ECG and pulse monitoring    Monitor NIBP   Notify physician   Monitor O2 SATs   NPO status   Vital signs   Monitor NIBP, ECG and PSA02   Notify physician   Discharge home with responsible adult.   Discharge instructions   Colonoscopy: verify informed consent     Other   History of colonic polyps - Primary   Relevant Medications   0.9 %  sodium chloride  infusion   Other Relevant Orders   Diet NPO   Hypoglycemia/Hyperglycemia protocol   Emergency Medications   ECG and pulse monitoring    Monitor NIBP   Notify physician   Monitor O2 SATs   NPO status   Vital signs   Monitor NIBP, ECG and PSA02   Notify physician   Discharge home with responsible adult.   Discharge instructions   Colonoscopy: verify informed consent    No follow-ups on file.    Kyung Pi, RN

## 2024-05-10 NOTE — Progress Notes (Signed)
 Called to room to assist during endoscopic procedure.  Patient ID and intended procedure confirmed with present staff. Received instructions for my participation in the procedure from the performing physician.

## 2024-05-10 NOTE — Progress Notes (Signed)
 Pt's states no medical or surgical changes since previsit or office visit.

## 2024-05-11 ENCOUNTER — Telehealth: Payer: Self-pay

## 2024-05-11 NOTE — Telephone Encounter (Signed)
  Follow up Call-     05/10/2024    2:39 PM  Call back number  Post procedure Call Back phone  # [phone]  Permission to leave phone message Yes     Patient questions:  Do you have a fever, pain , or abdominal swelling? No. Pain Score  0 *  Have you tolerated food without any problems? Yes.    Have you been able to return to your normal activities? Yes.    Do you have any questions about your discharge instructions: Diet   No. Medications  No. Follow up visit  No.  Do you have questions or concerns about your Care? No.  Actions: * If pain score is 4 or above: No action needed, pain <4.

## 2024-05-12 NOTE — Telephone Encounter (Signed)
 Patient states that over the last couple of days, he has experienced increased frequency in stools. Denies any dark black stools or abdominal pain. I have advised that change in bowels can occur for several days following colonoscopy, possibly even residual from his recent prep. Advised that this should be self limiting. However, if he continues with symptoms, starts experiencing black stools, abdominal pain, SOB, dizziness, he should make us  aware.

## 2024-05-12 NOTE — Telephone Encounter (Signed)
 Inbound call from patient, states he is having an increase in bowel movements after his procedure, he would like to know if this is normal

## 2024-05-13 ENCOUNTER — Ambulatory Visit: Payer: Self-pay | Admitting: Gastroenterology

## 2024-05-13 DIAGNOSIS — D128 Benign neoplasm of rectum: Secondary | ICD-10-CM

## 2024-05-13 DIAGNOSIS — Z860100 Personal history of colon polyps, unspecified: Secondary | ICD-10-CM

## 2024-05-13 LAB — SURGICAL PATHOLOGY

## 2024-06-14 ENCOUNTER — Encounter: Payer: Self-pay | Admitting: Gastroenterology

## 2024-07-06 NOTE — Telephone Encounter (Signed)
 Inbound call from patient stating he has further questions about prep medication for 8/20 procedure. Please advise, thank you

## 2024-08-09 ENCOUNTER — Telehealth: Payer: Self-pay | Admitting: *Deleted

## 2024-08-09 NOTE — Telephone Encounter (Signed)
 Attempted to return the patient's phone call and was hung up on twice trying to reach the patient. Unable to leave VM.

## 2024-08-09 NOTE — Telephone Encounter (Addendum)
 Returned pts call.  Answered all questions regarding prep instructions.

## 2024-08-09 NOTE — Telephone Encounter (Signed)
 Patient returning phone call. Please advise, thank you

## 2024-08-09 NOTE — Telephone Encounter (Signed)
 Patient requesting f/u call in regards to prep instructions. Please advise.

## 2024-08-09 NOTE — Telephone Encounter (Signed)
 Attempted to call the patient back to review his prep instructions with the patent. Left a detailed message for the patient that he can eat today breakfast and lunch but will need to have a clear liquid diet for dinner and take his prep at 6 pm tonight. Tomorrow am he will need to complete an enema prior to coming in. Left message for him to return my phone call if he has any further questions.

## 2024-08-10 ENCOUNTER — Encounter: Payer: Self-pay | Admitting: Gastroenterology

## 2024-08-10 ENCOUNTER — Ambulatory Visit: Admitting: Gastroenterology

## 2024-08-10 VITALS — BP 138/81 | HR 46 | Temp 98.1°F | Resp 12 | Ht 67.0 in | Wt 215.0 lb

## 2024-08-10 DIAGNOSIS — K648 Other hemorrhoids: Secondary | ICD-10-CM

## 2024-08-10 DIAGNOSIS — Z860101 Personal history of adenomatous and serrated colon polyps: Secondary | ICD-10-CM

## 2024-08-10 DIAGNOSIS — Z9889 Other specified postprocedural states: Secondary | ICD-10-CM

## 2024-08-10 DIAGNOSIS — Z860100 Personal history of colon polyps, unspecified: Secondary | ICD-10-CM

## 2024-08-10 DIAGNOSIS — K621 Rectal polyp: Secondary | ICD-10-CM

## 2024-08-10 DIAGNOSIS — D128 Benign neoplasm of rectum: Secondary | ICD-10-CM

## 2024-08-10 DIAGNOSIS — Z1211 Encounter for screening for malignant neoplasm of colon: Secondary | ICD-10-CM | POA: Diagnosis not present

## 2024-08-10 MED ORDER — SODIUM CHLORIDE 0.9 % IV SOLN: 500.0000 mL | INTRAVENOUS | Status: DC
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
## 2024-08-10 NOTE — Op Note (Signed)
 [facility] Patient Name: Patrick Torres Procedure Date: 08/10/2024 10:57 AM MRN: [id number] Endoscopist: Elspeth P. Leigh , MD, [id number] Age: 63 Referring MD:  Date of Birth: 1961/01/02 Gender: Male Account #: [id number] Procedure:                Flexible Sigmoidoscopy Indications:              High risk colon cancer surveillance: Personal                            history of colonic polyps - large / advanced rectal                            adenoma removed May 2025. Per pathology, concern                            for adenomatous tissue at the cauterized margin,                            hard to clear due to cauterization Medicines:                Monitored Anesthesia Care Procedure:                Pre-Anesthesia Assessment:                           - Prior to the procedure, a History and Physical                            was performed, and patient medications and                            allergies were reviewed. The patient's tolerance of                            previous anesthesia was also reviewed. The risks                            and benefits of the procedure and the sedation                            options and risks were discussed with the patient.                            All questions were answered, and informed consent                            was obtained. Prior Anticoagulants: The patient has                            taken no anticoagulant or antiplatelet agents. ASA                            Grade Assessment: III - A patient with severe  systemic disease. After reviewing the risks and                            benefits, the patient was deemed in satisfactory                            condition to undergo the procedure.                           After obtaining informed consent, the scope was                            passed under direct vision. The [id number]-[id number] Slim SN                            [id number]  was introduced through the anus and                            advanced to the the sigmoid colon. The flexible                            sigmoidoscopy was accomplished without difficulty.                            The patient tolerated the procedure well. The                            quality of the bowel preparation was adequate. Scope In: 11:19:01 AM Scope Out: 11:24:56 AM Total Procedure Duration: 0 hours 5 minutes 55 seconds  Findings:                 The perianal and digital rectal examinations were                            normal.                           The recto-sigmoid colon and distal sigmoid colon                            appeared normal.                           A post polypectomy scar was found in the distal                            rectum with an overlying retained hemostasis clip,                            previously applied to reduce risk for post                            polypectomy bleeding. There was no evidence of the  previous polyp. The site looked healthy. The base                            of the clip was evaluated from multiple angles, no                            residual polypoid tissue seen. Several biopsies                            were taken with a cold forceps for histology from                            the site to ensure no residual adenomatous tissue.                           Internal hemorrhoids were found during                            retroflexion. The hemorrhoids were small.                           The exam was otherwise without abnormality. Complications:            No immediate complications. Estimated blood loss:                            Minimal. Estimated Blood Loss:     Estimated blood loss was minimal. Impression:               - The recto-sigmoid colon and distal sigmoid colon                            are normal.                           - Post-polypectomy scar in the distal rectum with                             retained hemostasis clip, but no obvious residual                            or recurrent polypoid tissue. Biopsied as outlined.                           - Internal hemorrhoids.                           - The examination was otherwise normal. Recommendation:           - Discharge patient to home.                           - Resume previous diet.                           - Continue present medications.                           -  Await pathology results. Elspeth P. Analese Sovine, MD 08/10/2024 11:30:53 AM This report has been signed electronically.

## 2024-08-10 NOTE — Progress Notes (Signed)
 Report given to PACU, vss

## 2024-08-10 NOTE — Patient Instructions (Signed)

## 2024-08-10 NOTE — Progress Notes (Signed)
 Called to room to assist during endoscopic procedure.  Patient ID and intended procedure confirmed with present staff. Received instructions for my participation in the procedure from the performing physician.

## 2024-08-11 ENCOUNTER — Telehealth: Payer: Self-pay

## 2024-08-11 NOTE — Telephone Encounter (Signed)
    Follow up Call-     08/10/2024    9:50 AM 05/10/2024    2:39 PM  Call back number  Post procedure Call Back phone  # [phone]  Permission to leave phone message Yes Yes     Patient questions:  Do you have a fever, pain , or abdominal swelling? No. Pain Score  0 *  Have you tolerated food without any problems? Yes.    Have you been able to return to your normal activities? Yes.    Do you have any questions about your discharge instructions: Diet   No. Medications  No. Follow up visit  No.  Do you have questions or concerns about your Care? No.  Actions: * If pain score is 4 or above: No action needed, pain <4.

## 2024-08-12 LAB — SURGICAL PATHOLOGY

## 2024-08-14 ENCOUNTER — Ambulatory Visit: Payer: Self-pay | Admitting: Gastroenterology

## 2025-01-26 ENCOUNTER — Other Ambulatory Visit: Payer: Self-pay | Admitting: Adult Health

## 2025-01-26 ENCOUNTER — Telehealth: Payer: Self-pay | Admitting: Adult Health

## 2025-01-26 NOTE — Telephone Encounter (Signed)
Patient called to verify appointment date.

## 2025-04-03 ENCOUNTER — Ambulatory Visit: Admitting: Adult Health
